# Patient Record
Sex: Female | Born: 1985 | Race: Black or African American | Hispanic: No | Smoking: Former smoker
Health system: Southern US, Community
[De-identification: ages and names within clinical notes are randomized; demographics above are authoritative.]

## PROBLEM LIST (undated history)

## (undated) DIAGNOSIS — F32A Depression, unspecified: Secondary | ICD-10-CM

## (undated) DIAGNOSIS — F329 Major depressive disorder, single episode, unspecified: Secondary | ICD-10-CM

## (undated) DIAGNOSIS — D649 Anemia, unspecified: Secondary | ICD-10-CM

## (undated) DIAGNOSIS — R519 Headache, unspecified: Secondary | ICD-10-CM

## (undated) DIAGNOSIS — R7303 Prediabetes: Secondary | ICD-10-CM

## (undated) DIAGNOSIS — K219 Gastro-esophageal reflux disease without esophagitis: Secondary | ICD-10-CM

## (undated) DIAGNOSIS — I10 Essential (primary) hypertension: Secondary | ICD-10-CM

## (undated) DIAGNOSIS — R42 Dizziness and giddiness: Secondary | ICD-10-CM

## (undated) DIAGNOSIS — M1990 Unspecified osteoarthritis, unspecified site: Secondary | ICD-10-CM

## (undated) DIAGNOSIS — F419 Anxiety disorder, unspecified: Secondary | ICD-10-CM

## (undated) HISTORY — PX: WISDOM TOOTH EXTRACTION: SHX21

## (undated) HISTORY — PX: TUBAL LIGATION: SHX77

---

## 2004-10-15 ENCOUNTER — Emergency Department: Payer: Self-pay | Admitting: Unknown Physician Specialty

## 2005-02-04 ENCOUNTER — Observation Stay: Payer: Self-pay | Admitting: Obstetrics and Gynecology

## 2005-02-05 ENCOUNTER — Observation Stay: Payer: Self-pay

## 2005-03-03 ENCOUNTER — Observation Stay: Payer: Self-pay | Admitting: Obstetrics and Gynecology

## 2005-03-11 ENCOUNTER — Observation Stay: Payer: Self-pay

## 2005-03-13 ENCOUNTER — Observation Stay: Payer: Self-pay

## 2005-03-27 ENCOUNTER — Observation Stay: Payer: Self-pay | Admitting: Obstetrics and Gynecology

## 2005-04-11 ENCOUNTER — Observation Stay: Payer: Self-pay

## 2005-04-14 ENCOUNTER — Observation Stay: Payer: Self-pay

## 2005-04-30 ENCOUNTER — Inpatient Hospital Stay: Payer: Self-pay | Admitting: Obstetrics and Gynecology

## 2005-11-25 ENCOUNTER — Emergency Department: Payer: Self-pay | Admitting: Emergency Medicine

## 2006-03-17 ENCOUNTER — Emergency Department: Payer: Self-pay | Admitting: Emergency Medicine

## 2006-07-29 ENCOUNTER — Emergency Department: Payer: Self-pay | Admitting: Emergency Medicine

## 2007-07-28 ENCOUNTER — Encounter: Payer: Self-pay | Admitting: Obstetrics and Gynecology

## 2007-09-30 ENCOUNTER — Emergency Department: Payer: Self-pay | Admitting: Emergency Medicine

## 2007-11-04 ENCOUNTER — Observation Stay: Payer: Self-pay

## 2007-12-22 ENCOUNTER — Observation Stay: Payer: Self-pay | Admitting: Obstetrics and Gynecology

## 2008-02-08 ENCOUNTER — Observation Stay: Payer: Self-pay

## 2008-02-09 ENCOUNTER — Observation Stay: Payer: Self-pay | Admitting: Obstetrics & Gynecology

## 2008-02-16 ENCOUNTER — Observation Stay: Payer: Self-pay

## 2008-02-19 ENCOUNTER — Inpatient Hospital Stay: Payer: Self-pay

## 2009-03-12 ENCOUNTER — Ambulatory Visit: Payer: Self-pay

## 2009-03-28 ENCOUNTER — Encounter: Payer: Self-pay | Admitting: Maternal & Fetal Medicine

## 2009-04-05 ENCOUNTER — Emergency Department: Payer: Self-pay | Admitting: Emergency Medicine

## 2009-04-21 ENCOUNTER — Emergency Department: Payer: Self-pay | Admitting: Emergency Medicine

## 2009-04-25 ENCOUNTER — Encounter: Payer: Self-pay | Admitting: Obstetrics and Gynecology

## 2009-05-09 ENCOUNTER — Encounter: Payer: Self-pay | Admitting: Obstetrics and Gynecology

## 2009-06-06 ENCOUNTER — Encounter: Payer: Self-pay | Admitting: Obstetrics and Gynecology

## 2009-06-17 ENCOUNTER — Encounter: Payer: Self-pay | Admitting: Maternal and Fetal Medicine

## 2009-10-17 ENCOUNTER — Observation Stay: Payer: Self-pay

## 2009-10-18 ENCOUNTER — Observation Stay: Payer: Self-pay | Admitting: Unknown Physician Specialty

## 2009-10-31 ENCOUNTER — Observation Stay: Payer: Self-pay | Admitting: Obstetrics & Gynecology

## 2009-11-04 ENCOUNTER — Inpatient Hospital Stay: Payer: Self-pay | Admitting: Obstetrics and Gynecology

## 2010-08-25 ENCOUNTER — Emergency Department (HOSPITAL_COMMUNITY): Admission: EM | Admit: 2010-08-25 | Discharge: 2010-08-25 | Payer: Self-pay | Admitting: Emergency Medicine

## 2011-02-19 LAB — URINALYSIS, ROUTINE W REFLEX MICROSCOPIC
Glucose, UA: NEGATIVE mg/dL
Nitrite: NEGATIVE
pH: 6 (ref 5.0–8.0)

## 2011-02-19 LAB — URINE MICROSCOPIC-ADD ON

## 2011-02-19 LAB — URINE CULTURE
Colony Count: 15000
Culture  Setup Time: 201109191615

## 2011-02-19 LAB — POCT PREGNANCY, URINE: Preg Test, Ur: NEGATIVE

## 2011-08-28 ENCOUNTER — Other Ambulatory Visit: Payer: Self-pay

## 2011-08-28 ENCOUNTER — Emergency Department (HOSPITAL_COMMUNITY)
Admission: EM | Admit: 2011-08-28 | Discharge: 2011-08-28 | Disposition: A | Discharge status: Home / Self Care | Payer: Medicaid Other | Attending: Emergency Medicine | Admitting: Emergency Medicine

## 2011-08-28 ENCOUNTER — Encounter: Payer: Self-pay | Admitting: *Deleted

## 2011-08-28 ENCOUNTER — Emergency Department (HOSPITAL_COMMUNITY): Payer: Medicaid Other

## 2011-08-28 DIAGNOSIS — R0789 Other chest pain: Secondary | ICD-10-CM

## 2011-08-28 DIAGNOSIS — R0602 Shortness of breath: Secondary | ICD-10-CM | POA: Insufficient documentation

## 2011-08-28 DIAGNOSIS — Z87891 Personal history of nicotine dependence: Secondary | ICD-10-CM | POA: Insufficient documentation

## 2011-08-28 DIAGNOSIS — R079 Chest pain, unspecified: Secondary | ICD-10-CM | POA: Insufficient documentation

## 2011-08-28 MED ORDER — IBUPROFEN 800 MG PO TABS
800.0000 mg | ORAL_TABLET | Freq: Once | ORAL | Status: AC
  Administered 2011-08-28: 800 mg via ORAL
  Filled 2011-08-28: qty 1

## 2011-08-28 NOTE — ED Notes (Signed)
Sinus Rhythm 71 on monitor--Taken to x-ray via w/c--Stable

## 2011-08-28 NOTE — ED Notes (Signed)
Returned to ER--back on monitor  SR  62

## 2011-08-28 NOTE — ED Provider Notes (Signed)
Medical screening examination/treatment/procedure(s) were conducted as a shared visit with non-physician practitioner(s) and myself.  I personally evaluated the patient during the encounter.  Low risk for ACS/PE;  No aggressive testing necessary  Donnetta Hutching, MD 08/28/11 1004

## 2011-08-28 NOTE — ED Provider Notes (Signed)
History     CSN: [id number] Arrival date & time: 08/28/2011  8:17 AM  Chief Complaint  Patient presents with  . Chest Pain    HPI  (Consider location/radiation/quality/duration/timing/severity/associated sxs/prior treatment)  Patient is a 25 y.o. female presenting with chest pain. The history is provided by the patient. No language interpreter was used.  Chest Pain The chest pain began 3 - 5 days ago. The pain is associated with breathing (movement and palpation). At its most intense, the pain is at 7/10. The quality of the pain is described as sharp. The pain does not radiate. Primary symptoms include shortness of breath. Pertinent negatives for primary symptoms include no fever, no fatigue, no syncope, no cough, no wheezing, no palpitations, no abdominal pain, no nausea, no vomiting, no dizziness and no altered mental status.  Pertinent negatives for associated symptoms include no claudication, no diaphoresis, no lower extremity edema, no numbness, no orthopnea, no paroxysmal nocturnal dyspnea and no weakness. She tried nothing for the symptoms.  Her past medical history is significant for anxiety/panic attacks.  Pertinent negatives for past medical history include no cancer, no congenital heart disease, no DVT, no hyperlipidemia and no hypertension.  Her family medical history is significant for CAD in family.  Procedure history is negative for cardiac catheterization, echocardiogram and exercise treadmill test.     History reviewed. No pertinent past medical history.  Past Surgical History  Procedure Date  . Bilateral tubal ligation     History reviewed. No pertinent family history.  History  Substance Use Topics  . Smoking status: Former Games developer  . Smokeless tobacco: Not on file  . Alcohol Use: Yes     occasionally    OB History    Grav Para Term Preterm Abortions TAB SAB Ect Mult Living                  Review of Systems  Review of Systems  Constitutional:  Negative for fever, diaphoresis and fatigue.  Respiratory: Positive for shortness of breath. Negative for cough and wheezing.   Cardiovascular: Positive for chest pain. Negative for palpitations, orthopnea, claudication and syncope.  Gastrointestinal: Negative for nausea, vomiting and abdominal pain.  Neurological: Negative for dizziness, weakness and numbness.  Psychiatric/Behavioral: Negative for altered mental status.    Allergies  Review of patient's allergies indicates no known allergies.  Home Medications  No current outpatient prescriptions on file.  Physical Exam    BP 145/101  Pulse 73  Temp(Src) 98.9 F (37.2 C) (Oral)  Resp 16  Ht 5\' 4"  (1.626 m)  Wt 180 lb (81.647 kg)  BMI 30.90 kg/m2  SpO2 100%  LMP 08/16/2011  Physical Exam  Nursing note and vitals reviewed. Constitutional: She appears well-developed and well-nourished. No distress.  HENT:  Head: Normocephalic.  Neck: Normal range of motion. No JVD present. No tracheal deviation present.  Cardiovascular: Normal rate, regular rhythm, normal heart sounds, intact distal pulses and normal pulses.  PMI is not displaced.  Exam reveals no gallop and no friction rub.   No murmur heard. Pulmonary/Chest: Effort normal and breath sounds normal. No stridor. No respiratory distress. She has no wheezes. She has no rales. She exhibits tenderness.  Lymphadenopathy:    She has no cervical adenopathy.  Neurological: She is alert. GCS eye subscore is 4. GCS verbal subscore is 5. GCS motor subscore is 6.  Skin: Skin is warm and dry. She is not diaphoretic.  Psychiatric: She has a normal mood and affect. Judgment normal.  ED Course  Procedures (including critical care time)  Labs Reviewed - No data to display No results found.   No diagnosis found.   MDM    Date: 08/28/2011  Rate: 68  Rhythm: normal sinus rhythm  QRS Axis: normal  Intervals: normal  ST/T Wave abnormalities: normal  Conduction  Disutrbances:none  Narrative Interpretation:   Old EKG Reviewed: none available         Worthy Rancher, PA 08/28/11 0903  Worthy Rancher, PA 08/28/11 [phone]

## 2011-08-28 NOTE — ED Notes (Signed)
Pt c/o pain in her chest in the center and epigastric area. States that it is worse with deep breathing. Pt states that it gets worse when mashing on it and with movement. Started 4 days ago. Also c/o some dizziness and shortness of breath.

## 2011-08-28 NOTE — ED Notes (Signed)
C/o intermittent mid sternal chest pain radiating into right posterior lung region that began four days ago---Last p.m. Had SOA and dizziness associated with discomfort.  Rates pain 8 on 1-10 scale.  SR 77 on monitor  B/P 130/61  R 20 P.O. 99% on room air--Lungs CTA

## 2011-11-23 ENCOUNTER — Emergency Department (HOSPITAL_COMMUNITY)
Admission: EM | Admit: 2011-11-23 | Discharge: 2011-11-23 | Disposition: A | Discharge status: Home / Self Care | Payer: Medicaid Other | Attending: Emergency Medicine | Admitting: Emergency Medicine

## 2011-11-23 ENCOUNTER — Encounter (HOSPITAL_COMMUNITY): Payer: Self-pay | Admitting: *Deleted

## 2011-11-23 DIAGNOSIS — R112 Nausea with vomiting, unspecified: Secondary | ICD-10-CM | POA: Insufficient documentation

## 2011-11-23 DIAGNOSIS — R197 Diarrhea, unspecified: Secondary | ICD-10-CM | POA: Insufficient documentation

## 2011-11-23 DIAGNOSIS — R42 Dizziness and giddiness: Secondary | ICD-10-CM | POA: Insufficient documentation

## 2011-11-23 HISTORY — DX: Major depressive disorder, single episode, unspecified: F32.9

## 2011-11-23 HISTORY — DX: Depression, unspecified: F32.A

## 2011-11-23 HISTORY — DX: Anxiety disorder, unspecified: F41.9

## 2011-11-23 LAB — CBC
Hemoglobin: 10.9 g/dL — ABNORMAL LOW (ref 12.0–15.0)
MCH: 28.5 pg (ref 26.0–34.0)
MCHC: 32.9 g/dL (ref 30.0–36.0)
MCV: 86.6 fL (ref 78.0–100.0)
RBC: 3.82 MIL/uL — ABNORMAL LOW (ref 3.87–5.11)

## 2011-11-23 LAB — BASIC METABOLIC PANEL
BUN: 4 mg/dL — ABNORMAL LOW (ref 6–23)
Calcium: 9.4 mg/dL (ref 8.4–10.5)
Creatinine, Ser: 0.68 mg/dL (ref 0.50–1.10)
GFR calc Af Amer: >90 mL/min (ref >90)
GFR calc non Af Amer: >90 mL/min (ref >90)
Glucose, Bld: 93 mg/dL (ref 70–99)

## 2011-11-23 LAB — DIFFERENTIAL
Basophils Relative: 0 % (ref 0–1)
Eosinophils Absolute: 0 10*3/uL (ref 0.0–0.7)
Eosinophils Relative: 1 % (ref 0–5)
Lymphs Abs: 2 10*3/uL (ref 0.7–4.0)
Monocytes Absolute: 0.3 10*3/uL (ref 0.1–1.0)
Monocytes Relative: 6 % (ref 3–12)

## 2011-11-23 LAB — URINALYSIS, ROUTINE W REFLEX MICROSCOPIC
Bilirubin Urine: NEGATIVE
Ketones, ur: NEGATIVE mg/dL
Nitrite: NEGATIVE
Specific Gravity, Urine: 1.01 (ref 1.005–1.030)
Urobilinogen, UA: 0.2 mg/dL (ref 0.0–1.0)
pH: 6 (ref 5.0–8.0)

## 2011-11-23 LAB — URINE MICROSCOPIC-ADD ON

## 2011-11-23 MED ORDER — ONDANSETRON HCL 4 MG/2ML IJ SOLN
4.0000 mg | Freq: Once | INTRAMUSCULAR | Status: AC
  Administered 2011-11-23: 4 mg via INTRAVENOUS
  Filled 2011-11-23: qty 2

## 2011-11-23 MED ORDER — SODIUM CHLORIDE 0.9 % IV BOLUS (SEPSIS)
1000.0000 mL | Freq: Once | INTRAVENOUS | Status: AC
  Administered 2011-11-23: 1000 mL via INTRAVENOUS

## 2011-11-23 MED ORDER — METOCLOPRAMIDE HCL 10 MG PO TABS: 10.0000 mg | ORAL_TABLET | Freq: Four times a day (QID) | ORAL | Status: AC

## 2011-11-23 MED ORDER — METOCLOPRAMIDE HCL 10 MG PO TABS: 10.0000 mg | ORAL_TABLET | Freq: Once | ORAL | Status: DC

## 2011-11-23 NOTE — ED Notes (Signed)
Lower abd pain with n/v/d that started x 4 days ago.  Pt also c/o dizziness and "confusion".  States when someone asks her a question, she "dazes out".  Pt alert and oriented x 4 at this time.  Ambulatory with steady gait to room.  nad noted.

## 2011-11-23 NOTE — ED Provider Notes (Signed)
History     CSN: [id number] Arrival date & time: 11/23/2011  9:06 AM   First MD Initiated Contact with Patient 11/23/11 [phone]      Chief Complaint  Patient presents with  . n/v/d     (Consider location/radiation/quality/duration/timing/severity/associated sxs/prior treatment) HPI Comments: Patient reports intermittent nausea, vomiting and watery diarrhea for 3-4 days.  Sx's began after eating a a fast food restaurant.  States she has not been able to keep down food or much fluids recently and states that she feels dizzy and "lightheaded" at times with position change.  She denies abd pain, headache or visual changes  Patient is a 25 y.o. female presenting with vomiting. The history is provided by the patient.  Emesis  This is a new problem. The current episode started more than 2 days ago. The problem occurs 2 to 4 times per day. The problem has been gradually improving. The emesis has an appearance of stomach contents and bilious material. There has been no fever. Associated symptoms include diarrhea. Pertinent negatives include no abdominal pain, no chills, no cough, no fever, no headaches, no myalgias and no URI. Risk factors include suspect food intake.    Past Medical History  Diagnosis Date  . Depression   . Anxiety     Past Surgical History  Procedure Date  . Bilateral tubal ligation     No family history on file.  History  Substance Use Topics  . Smoking status: Former Games developer  . Smokeless tobacco: Not on file  . Alcohol Use: Yes     occasionally    OB History    Grav Para Term Preterm Abortions TAB SAB Ect Mult Living                  Review of Systems  Constitutional: Positive for appetite change. Negative for fever, chills and activity change.  HENT: Negative for sore throat, trouble swallowing, neck pain and neck stiffness.   Respiratory: Negative for cough and chest tightness.   Cardiovascular: Negative for chest pain.  Gastrointestinal: Positive for  nausea, vomiting and diarrhea. Negative for abdominal pain.  Genitourinary: Negative for dysuria, difficulty urinating and menstrual problem.  Musculoskeletal: Negative for myalgias.  Skin: Negative.   Neurological: Positive for dizziness. Negative for seizures, syncope, numbness and headaches.  All other systems reviewed and are negative.    Allergies  Review of patient's allergies indicates no known allergies.  Home Medications   Current Outpatient Rx  Name Route Sig Dispense Refill  . ACETAMINOPHEN 500 MG PO TABS Oral Take 1,000 mg by mouth every 6 (six) hours as needed. Pain       BP 150/106  Pulse 78  Temp(Src) 98.6 F (37 C) (Oral)  Resp 16  Ht 5\' 4"  (1.626 m)  Wt 185 lb (83.915 kg)  BMI 31.76 kg/m2  SpO2 99%  LMP 11/14/2011  Physical Exam  Nursing note and vitals reviewed. Constitutional: She is oriented to person, place, and time. She appears well-developed and well-nourished. No distress.  HENT:  Head: Normocephalic and atraumatic.  Mouth/Throat: Oropharynx is clear and moist.  Eyes: EOM are normal. Pupils are equal, round, and reactive to light.  Neck: Normal range of motion. Neck supple. No thyromegaly present.  Cardiovascular: Normal rate, regular rhythm and normal heart sounds.   Pulmonary/Chest: Effort normal and breath sounds normal. No respiratory distress. She exhibits no tenderness.  Abdominal: Soft. She exhibits no distension and no mass. There is no tenderness. There is no rebound and  no guarding.  Musculoskeletal: Normal range of motion. She exhibits no edema and no tenderness.  Lymphadenopathy:    She has no cervical adenopathy.  Neurological: She is alert and oriented to person, place, and time. She has normal reflexes. No cranial nerve deficit. She exhibits normal muscle tone. Coordination normal.  Skin: Skin is warm and dry.  Psychiatric: She has a normal mood and affect.    ED Course  Procedures (including critical care time)  Results for  orders placed during the hospital encounter of 11/23/11  URINALYSIS, ROUTINE W REFLEX MICROSCOPIC      Component Value Range   Color, Urine YELLOW  YELLOW    APPearance CLEAR  CLEAR    Specific Gravity, Urine 1.010  1.005 - 1.030    pH 6.0  5.0 - 8.0    Glucose, UA NEGATIVE  NEGATIVE (mg/dL)   Hgb urine dipstick TRACE (*) NEGATIVE    Bilirubin Urine NEGATIVE  NEGATIVE    Ketones, ur NEGATIVE  NEGATIVE (mg/dL)   Protein, ur NEGATIVE  NEGATIVE (mg/dL)   Urobilinogen, UA 0.2  0.0 - 1.0 (mg/dL)   Nitrite NEGATIVE  NEGATIVE    Leukocytes, UA SMALL (*) NEGATIVE   POCT PREGNANCY, URINE      Component Value Range   Preg Test, Ur NEGATIVE    URINE MICROSCOPIC-ADD ON      Component Value Range   Squamous Epithelial / LPF FEW (*) RARE    WBC, UA 0-2  <3 (WBC/hpf)   RBC / HPF 0-2  <3 (RBC/hpf)  CBC      Component Value Range   WBC 5.7  4.0 - 10.5 (K/uL)   RBC 3.82 (*) 3.87 - 5.11 (MIL/uL)   Hemoglobin 10.9 (*) 12.0 - 15.0 (g/dL)   HCT 16.1 (*) 09.6 - 46.0 (%)   MCV 86.6  78.0 - 100.0 (fL)   MCH 28.5  26.0 - 34.0 (pg)   MCHC 32.9  30.0 - 36.0 (g/dL)   RDW 04.5  40.9 - 81.1 (%)   Platelets 256  150 - 400 (K/uL)  DIFFERENTIAL      Component Value Range   Neutrophils Relative 59  43 - 77 (%)   Neutro Abs 3.3  1.7 - 7.7 (K/uL)   Lymphocytes Relative 35  12 - 46 (%)   Lymphs Abs 2.0  0.7 - 4.0 (K/uL)   Monocytes Relative 6  3 - 12 (%)   Monocytes Absolute 0.3  0.1 - 1.0 (K/uL)   Eosinophils Relative 1  0 - 5 (%)   Eosinophils Absolute 0.0  0.0 - 0.7 (K/uL)   Basophils Relative 0  0 - 1 (%)   Basophils Absolute 0.0  0.0 - 0.1 (K/uL)  BASIC METABOLIC PANEL      Component Value Range   Sodium 138  135 - 145 (mEq/L)   Potassium 3.7  3.5 - 5.1 (mEq/L)   Chloride 103  96 - 112 (mEq/L)   CO2 31  19 - 32 (mEq/L)   Glucose, Bld 93  70 - 99 (mg/dL)   BUN 4 (*) 6 - 23 (mg/dL)   Creatinine, Ser 9.14  0.50 - 1.10 (mg/dL)   Calcium 9.4  8.4 - 78.2 (mg/dL)   GFR calc non Af Amer >90  >90  (mL/min)   GFR calc Af Amer >90  >90 (mL/min)       MDM    12:02 PM patient is feeling better.  Vitals stable.  No orthostatic hypotension.  HAs drank  fluids and received IVF's.  Abd remains soft , NT , no guarding or rebound.  I have advised her of her hgb level today.  She recently stopped her menses and with probable viral illness, the low hgb may be result of those factors.  She agrres to close f/u with her PMD for recheck.  Also agrees to return here if sx's worsen.  Patient / Family / Caregiver understand and agree with initial ED impression and plan with expectations set for ED visit.   Pt feels improved after observation and/or treatment in ED.       Domique Clapper L. [place], [place] 11/25/11 2126

## 2011-11-27 NOTE — ED Provider Notes (Signed)
Evaluation and management procedures were performed by the PA/NP under my supervision/collaboration.    Felisa Bonier, MD 11/27/11 [phone]

## 2014-08-06 ENCOUNTER — Encounter (HOSPITAL_COMMUNITY): Payer: Self-pay | Admitting: Emergency Medicine

## 2014-08-06 ENCOUNTER — Emergency Department (HOSPITAL_COMMUNITY)
Admission: EM | Admit: 2014-08-06 | Discharge: 2014-08-06 | Disposition: A | Discharge status: Home / Self Care | Payer: Medicaid Other | Attending: Emergency Medicine | Admitting: Emergency Medicine

## 2014-08-06 DIAGNOSIS — J069 Acute upper respiratory infection, unspecified: Secondary | ICD-10-CM | POA: Diagnosis not present

## 2014-08-06 DIAGNOSIS — Z8659 Personal history of other mental and behavioral disorders: Secondary | ICD-10-CM | POA: Diagnosis not present

## 2014-08-06 DIAGNOSIS — Z79899 Other long term (current) drug therapy: Secondary | ICD-10-CM | POA: Insufficient documentation

## 2014-08-06 DIAGNOSIS — J029 Acute pharyngitis, unspecified: Secondary | ICD-10-CM

## 2014-08-06 DIAGNOSIS — IMO0001 Reserved for inherently not codable concepts without codable children: Secondary | ICD-10-CM | POA: Diagnosis not present

## 2014-08-06 DIAGNOSIS — R51 Headache: Secondary | ICD-10-CM | POA: Insufficient documentation

## 2014-08-06 DIAGNOSIS — Z87891 Personal history of nicotine dependence: Secondary | ICD-10-CM | POA: Diagnosis not present

## 2014-08-06 DIAGNOSIS — R6883 Chills (without fever): Secondary | ICD-10-CM | POA: Insufficient documentation

## 2014-08-06 MED ORDER — AMOXICILLIN 500 MG PO CAPS: 500.0000 mg | ORAL_CAPSULE | Freq: Three times a day (TID) | ORAL | Status: DC

## 2014-08-06 MED ORDER — ACETAMINOPHEN-CODEINE #3 300-30 MG PO TABS: 1.0000 | ORAL_TABLET | Freq: Four times a day (QID) | ORAL | Status: DC | PRN

## 2014-08-06 MED ORDER — IBUPROFEN 800 MG PO TABS: 800.0000 mg | ORAL_TABLET | Freq: Three times a day (TID) | ORAL | Status: DC

## 2014-08-06 NOTE — ED Notes (Signed)
Pt c/o sore throat x 3 days and bilateral earache, and headache.

## 2014-08-06 NOTE — ED Provider Notes (Signed)
CSN: [id number]     Arrival date & time 08/06/14  1114 History   First MD Initiated Contact with Patient 08/06/14 1315     Chief Complaint  Patient presents with  . Sore Throat     (Consider location/radiation/quality/duration/timing/severity/associated sxs/prior Treatment) Patient is a 28 y.o. female presenting with pharyngitis. The history is provided by the patient.  Sore Throat This is a new problem. The current episode started in the past 7 days. The problem occurs intermittently. The problem has been gradually worsening. Associated symptoms include chills, headaches, myalgias and a sore throat. Pertinent negatives include no abdominal pain, arthralgias, chest pain, coughing, fever or neck pain. Associated symptoms comments: earache. The symptoms are aggravated by swallowing. She has tried acetaminophen for the symptoms. The treatment provided no relief.    Past Medical History  Diagnosis Date  . Depression   . Anxiety    Past Surgical History  Procedure Laterality Date  . Bilateral tubal ligation     No family history on file. History  Substance Use Topics  . Smoking status: Former Games developer  . Smokeless tobacco: Not on file  . Alcohol Use: Yes     Comment: occasionally   OB History   Grav Para Term Preterm Abortions TAB SAB Ect Mult Living                 Review of Systems  Constitutional: Positive for chills. Negative for fever and activity change.       All ROS Neg except as noted in HPI  HENT: Positive for sore throat.   Eyes: Negative for photophobia and discharge.  Respiratory: Negative for cough, shortness of breath and wheezing.   Cardiovascular: Negative for chest pain and palpitations.  Gastrointestinal: Negative for abdominal pain and blood in stool.  Genitourinary: Negative for dysuria, frequency and hematuria.  Musculoskeletal: Positive for myalgias. Negative for arthralgias, back pain and neck pain.  Skin: Negative.   Neurological: Positive for  headaches. Negative for dizziness, seizures and speech difficulty.  Psychiatric/Behavioral: Negative for hallucinations and confusion.      Allergies  Review of patient's allergies indicates no known allergies.  Home Medications   Prior to Admission medications   Medication Sig Start Date End Date Taking? Authorizing Provider  acetaminophen (TYLENOL) 500 MG tablet Take 1,000 mg by mouth every 6 (six) hours as needed. Pain    Yes Historical Provider, MD  lisinopril-hydrochlorothiazide (PRINZIDE,ZESTORETIC) 10-12.5 MG per tablet Take 1 tablet by mouth daily.   Yes Historical Provider, MD  Vitamin D, Ergocalciferol, (DRISDOL) 50000 UNITS CAPS capsule Take 50,000 Units by mouth every 7 (seven) days. Thursday   Yes Historical Provider, MD   BP 127/74  Pulse 113  Temp(Src) 98.4 F (36.9 C) (Oral)  Resp 18  Wt 190 lb 3 oz (86.268 kg)  SpO2 100%  LMP 07/23/2014 Physical Exam  Nursing note and vitals reviewed. Constitutional: She is oriented to person, place, and time. She appears well-developed and well-nourished.  Non-toxic appearance.  HENT:  Head: Normocephalic.  Right Ear: Tympanic membrane and external ear normal.  Left Ear: Tympanic membrane and external ear normal.  Mouth/Throat: Uvula is midline and mucous membranes are normal. No trismus in the jaw. Oropharyngeal exudate and posterior oropharyngeal erythema present. No tonsillar abscesses.  Eyes: EOM and lids are normal. Pupils are equal, round, and reactive to light.  Neck: Normal range of motion. Neck supple. Carotid bruit is not present.  Cardiovascular: Intact distal pulses and normal pulses.   Murmur heard.  Pulmonary/Chest: Breath sounds normal. No respiratory distress.  Tachycardia with occasional skip beat. 2/6 systolic murmur  Abdominal: Soft. Bowel sounds are normal. There is no tenderness. There is no guarding.  Musculoskeletal: Normal range of motion.  Lymphadenopathy:       Head (right side): No submandibular  adenopathy present.       Head (left side): No submandibular adenopathy present.    She has no cervical adenopathy.  Neurological: She is alert and oriented to person, place, and time. She has normal strength. No cranial nerve deficit or sensory deficit.  Skin: Skin is warm and dry. No rash noted.  Psychiatric: She has a normal mood and affect. Her speech is normal.    ED Course  Procedures (including critical care time) Labs Review Labs Reviewed - No data to display  Imaging Review No results found.   EKG Interpretation None      MDM Exam is consistent with pharyngitis and URI Pt will be treated with amoxil, ibuprofen and tylenol codeine. She is to wash hands frequently and follow up with PCP.   Final diagnoses:  None    *I have reviewed nursing notes, vital signs, and all appropriate lab and imaging results for this patient.Kathie Dike, PA-C 08/06/14 1356

## 2014-08-06 NOTE — Discharge Instructions (Signed)

## 2014-08-07 NOTE — ED Provider Notes (Signed)
Medical screening examination/treatment/procedure(s) were performed by non-physician practitioner and as supervising physician I was immediately available for consultation/collaboration.   EKG Interpretation None       Tasfia Vasseur M Waris Rodger, MD 08/07/14 2040 

## 2014-12-03 ENCOUNTER — Ambulatory Visit (HOSPITAL_COMMUNITY)
Admission: RE | Admit: 2014-12-03 | Discharge: 2014-12-03 | Disposition: A | Discharge status: Home / Self Care | Payer: Medicaid Other | Source: Ambulatory Visit | Attending: Nurse Practitioner | Admitting: Nurse Practitioner

## 2014-12-03 ENCOUNTER — Other Ambulatory Visit (HOSPITAL_COMMUNITY): Payer: Self-pay | Admitting: Nurse Practitioner

## 2014-12-03 DIAGNOSIS — M722 Plantar fascial fibromatosis: Secondary | ICD-10-CM | POA: Diagnosis not present

## 2014-12-03 DIAGNOSIS — M79672 Pain in left foot: Principal | ICD-10-CM

## 2014-12-03 DIAGNOSIS — M79671 Pain in right foot: Secondary | ICD-10-CM | POA: Diagnosis present

## 2014-12-03 DIAGNOSIS — G8929 Other chronic pain: Secondary | ICD-10-CM

## 2017-08-20 ENCOUNTER — Emergency Department (HOSPITAL_COMMUNITY): Payer: BLUE CROSS/BLUE SHIELD

## 2017-08-20 ENCOUNTER — Encounter (HOSPITAL_COMMUNITY): Payer: Self-pay | Admitting: *Deleted

## 2017-08-20 ENCOUNTER — Emergency Department (HOSPITAL_COMMUNITY)
Admission: EM | Admit: 2017-08-20 | Discharge: 2017-08-21 | Disposition: A | Discharge status: Home / Self Care | Payer: BLUE CROSS/BLUE SHIELD | Attending: Emergency Medicine | Admitting: Emergency Medicine

## 2017-08-20 DIAGNOSIS — Z79899 Other long term (current) drug therapy: Secondary | ICD-10-CM | POA: Insufficient documentation

## 2017-08-20 DIAGNOSIS — M791 Myalgia: Secondary | ICD-10-CM | POA: Insufficient documentation

## 2017-08-20 DIAGNOSIS — Z87891 Personal history of nicotine dependence: Secondary | ICD-10-CM | POA: Diagnosis not present

## 2017-08-20 DIAGNOSIS — R1031 Right lower quadrant pain: Secondary | ICD-10-CM | POA: Diagnosis present

## 2017-08-20 DIAGNOSIS — M7918 Myalgia, other site: Secondary | ICD-10-CM

## 2017-08-20 DIAGNOSIS — R109 Unspecified abdominal pain: Secondary | ICD-10-CM

## 2017-08-20 LAB — RAPID URINE DRUG SCREEN, HOSP PERFORMED
AMPHETAMINES: NOT DETECTED
Barbiturates: NOT DETECTED
Benzodiazepines: NOT DETECTED
Cocaine: NOT DETECTED
OPIATES: NOT DETECTED
TETRAHYDROCANNABINOL: NOT DETECTED

## 2017-08-20 LAB — CBC
HEMATOCRIT: 32 % — AB (ref 36.0–46.0)
HEMOGLOBIN: 10.6 g/dL — AB (ref 12.0–15.0)
MCH: 29.8 pg (ref 26.0–34.0)
MCHC: 33.1 g/dL (ref 30.0–36.0)
MCV: 89.9 fL (ref 78.0–100.0)
Platelets: 269 10*3/uL (ref 150–400)
RBC: 3.56 MIL/uL — ABNORMAL LOW (ref 3.87–5.11)
RDW: 16.2 % — ABNORMAL HIGH (ref 11.5–15.5)
WBC: 7.7 10*3/uL (ref 4.0–10.5)

## 2017-08-20 LAB — URINALYSIS, ROUTINE W REFLEX MICROSCOPIC
Bilirubin Urine: NEGATIVE
GLUCOSE, UA: NEGATIVE mg/dL
Hgb urine dipstick: NEGATIVE
Ketones, ur: NEGATIVE mg/dL
LEUKOCYTES UA: NEGATIVE
Nitrite: NEGATIVE
PH: 6 (ref 5.0–8.0)
PROTEIN: NEGATIVE mg/dL
SPECIFIC GRAVITY, URINE: 1.02 (ref 1.005–1.030)

## 2017-08-20 LAB — BASIC METABOLIC PANEL
Anion gap: 7 (ref 5–15)
BUN: 11 mg/dL (ref 6–20)
CHLORIDE: 105 mmol/L (ref 101–111)
CO2: 23 mmol/L (ref 22–32)
Calcium: 9 mg/dL (ref 8.9–10.3)
Creatinine, Ser: 0.77 mg/dL (ref 0.44–1.00)
GFR calc Af Amer: >60 mL/min (ref >60)
GFR calc non Af Amer: >60 mL/min (ref >60)
Glucose, Bld: 104 mg/dL — ABNORMAL HIGH (ref 65–99)
POTASSIUM: 3.9 mmol/L (ref 3.5–5.1)
Sodium: 135 mmol/L (ref 135–145)

## 2017-08-20 LAB — LIPASE, BLOOD: Lipase: 28 U/L (ref 11–51)

## 2017-08-20 MED ORDER — IOPAMIDOL (ISOVUE-300) INJECTION 61%
100.0000 mL | Freq: Once | INTRAVENOUS | Status: AC | PRN
  Administered 2017-08-21: 100 mL via INTRAVENOUS

## 2017-08-20 MED ORDER — ONDANSETRON HCL 4 MG PO TABS
4.0000 mg | ORAL_TABLET | Freq: Once | ORAL | Status: AC
  Administered 2017-08-20: 4 mg via ORAL
  Filled 2017-08-20: qty 1

## 2017-08-20 MED ORDER — ACETAMINOPHEN 500 MG PO TABS
1000.0000 mg | ORAL_TABLET | Freq: Once | ORAL | Status: AC
  Administered 2017-08-20: 1000 mg via ORAL
  Filled 2017-08-20: qty 2

## 2017-08-20 MED ORDER — IBUPROFEN 800 MG PO TABS
800.0000 mg | ORAL_TABLET | Freq: Once | ORAL | Status: DC
  Filled 2017-08-20: qty 1

## 2017-08-20 NOTE — ED Triage Notes (Signed)
Pt reports right sided flank pain that radiates into her rlq. Pt reports nausea and urinary frequency. Pt states she was seen at an Urgent Care on Tuesday and was told she had a uti and was prescribed bactrim. Pt states she is still having the pain and felt like she has had a temp.

## 2017-08-21 LAB — PREGNANCY, URINE: Preg Test, Ur: NEGATIVE

## 2017-08-21 MED ORDER — IBUPROFEN 600 MG PO TABS: 600.0000 mg | ORAL_TABLET | Freq: Four times a day (QID) | ORAL | 0 refills | Status: DC

## 2017-08-21 MED ORDER — CYCLOBENZAPRINE HCL 10 MG PO TABS: 10.0000 mg | ORAL_TABLET | Freq: Three times a day (TID) | ORAL | 0 refills | Status: DC

## 2017-08-21 NOTE — Discharge Instructions (Signed)
Your urine tests and your pregnancy test are negative for acute problem. Your blood test are negative for acute problem. Your CT scan is negative for acute problem. Please see Dr.Muse to complete your workup, especially evaluation of your lower back and the paraspinal muscles in this area. Please use a heating pad to the back and flank area. Use Flexeril 3 times daily. Use ibuprofen 4 times daily with food, until seen by Dr. Ledell Peoples. Please return to the emergency department for additional evaluation if any new changes, problems, or concerns.

## 2017-08-21 NOTE — ED Provider Notes (Signed)
[facility] DEPT Provider Note   CSN: [id number] date & time: 08/20/17  2125     History   Chief Complaint Chief Complaint  Patient presents with  . Flank Pain    HPI Alyssa Pearson is a 31 y.o. female.  Patient is a 31 year old female who presents to the emergency department with a complaint of right back and side area pain. The patient states that this problem is been going on for nearly 2 weeks. The patient's pains started in the back, went to the flank area and then down toward the right lower abdomen. She has had nausea, but no vomiting. There's been no known injury to the area. No operations or procedures to the back or flank area. No loss of any bowel or bladder function. No difficulty with using the lower extremities. The painis worse with change of position. It is difficult to find a comfortable position when lying down. Nothing makes the pain any better. The patient has tried over-the-counter medications without success. The patient states that 3 days ago she was seen at urgent care, was told she had a urinary tract infection and was prescribed Bactrim. She states that since then she has not noted any significant improvement, and at times feels as though the pain may be worse. She presents to the emergency department now for additional evaluation and management.   The history is provided by the patient.    Past Medical History:  Diagnosis Date  . Anxiety   . Depression     There are no active problems to display for this patient.   Past Surgical History:  Procedure Laterality Date  . bilateral tubal ligation      OB History    No data available       Home Medications    Prior to Admission medications   Medication Sig Start Date End Date Taking? Authorizing Provider  Ferrous Sulfate (IRON) 325 (65 Fe) MG TABS Take 1 tablet by mouth 2 (two) times daily.   Yes [provider]  Multiple Vitamins-Minerals (MULTIVITAMIN ADULTS PO) Take 1  capsule by mouth daily.   Yes [provider]  vitamin C (ASCORBIC ACID) 250 MG tablet Take 250 mg by mouth daily.   Yes [provider]  Vitamin D, Ergocalciferol, (DRISDOL) 50000 UNITS CAPS capsule Take 50,000 Units by mouth every 7 (seven) days. Thursday   Yes [provider]    Family History History reviewed. No pertinent family history.  Social History Social History  Substance Use Topics  . Smoking status: Former Games developer  . Smokeless tobacco: Never Used  . Alcohol use No     Allergies   Patient has no known allergies.   Review of Systems Review of Systems  Constitutional: Negative for activity change.       All ROS Neg except as noted in HPI  HENT: Negative for nosebleeds.   Eyes: Negative for photophobia and discharge.  Respiratory: Negative for cough, shortness of breath and wheezing.   Cardiovascular: Negative for chest pain and palpitations.  Gastrointestinal: Positive for abdominal pain. Negative for blood in stool.  Genitourinary: Positive for flank pain. Negative for dysuria, frequency and hematuria.  Musculoskeletal: Positive for back pain. Negative for arthralgias and neck pain.  Skin: Negative.   Neurological: Negative for dizziness, seizures and speech difficulty.  Psychiatric/Behavioral: Negative for confusion and hallucinations.     Physical Exam Updated Vital Signs BP 132/79 (BP Location: Right Arm)   Pulse 79   Temp  98.7 F (37.1 C) (Oral)   Resp 17   Ht  (1.626 m)   Wt 80.3 kg (177 lb)   LMP 08/05/2017   SpO2 98%   BMI 30.38 kg/m   Physical Exam  Constitutional: She is oriented to person, place, and time. She appears well-developed and well-nourished.  Non-toxic appearance.  HENT:  Head: Normocephalic.  Right Ear: Tympanic membrane and external ear normal.  Left Ear: Tympanic membrane and external ear normal.  Eyes: Pupils are equal, round, and reactive to light. EOM and lids are normal.  Neck: Normal  range of motion. Neck supple. Carotid bruit is not present.  Cardiovascular: Normal rate, regular rhythm, normal heart sounds, intact distal pulses and normal pulses.   Pulmonary/Chest: Breath sounds normal. No respiratory distress.  Abdominal: Soft. Bowel sounds are normal. There is no tenderness. There is no guarding.  There is right lower quadrant tenderness to palpation and with attempted range of motion just above the pubic crests. No pain on the left. No suprapubic area pain.  Musculoskeletal: Normal range of motion.  There is pain to the mid to lower lumbar area to palpation. There is paraspinal area tenderness in this area.  Lymphadenopathy:       Head (right side): No submandibular adenopathy present.       Head (left side): No submandibular adenopathy present.    She has no cervical adenopathy.  Neurological: She is alert and oriented to person, place, and time. She has normal strength. No cranial nerve deficit or sensory deficit. Coordination normal.  There no gross motor or sensory deficits of the lower extremities. The patient is able to stand with good bowel.  Skin: Skin is warm and dry.  Psychiatric: She has a normal mood and affect. Her speech is normal.  Nursing note and vitals reviewed.    ED Treatments / Results  Labs (all labs ordered are listed, but only abnormal results are displayed) Labs Reviewed  CBC - Abnormal; Notable for the following:       Result Value   RBC 3.56 (*)    Hemoglobin 10.6 (*)    HCT 32.0 (*)    RDW 16.2 (*)    All other components within normal limits  BASIC METABOLIC PANEL - Abnormal; Notable for the following:    Glucose, Bld 104 (*)    All other components within normal limits  URINALYSIS, ROUTINE W REFLEX MICROSCOPIC  LIPASE, BLOOD  RAPID URINE DRUG SCREEN, HOSP PERFORMED  PREGNANCY, URINE  POC URINE PREG, ED    EKG  EKG Interpretation None       Radiology Ct Abdomen Pelvis W Contrast  Result Date: 08/21/2017 CLINICAL  DATA:  Right flank pain EXAM: CT ABDOMEN AND PELVIS WITH CONTRAST TECHNIQUE: Multidetector CT imaging of the abdomen and pelvis was performed using the standard protocol following bolus administration of intravenous contrast. CONTRAST:  ISOVUE-300 IOPAMIDOL (ISOVUE-300) INJECTION 61% COMPARISON:  None. FINDINGS: Lower chest: Lung base regions are clear. Hepatobiliary: No focal liver lesions are appreciable. Gallbladder wall is not appreciably thickened. There is no biliary duct dilatation. Pancreas: No pancreatic mass or inflammatory focus. Spleen: No splenic lesions are evident. Adrenals/Urinary Tract: Adrenals appear normal bilaterally. Kidneys bilaterally show no evident mass or hydronephrosis on either side. There is a junctional parenchymal defect in each kidney, an anatomic variant. There is no appreciable renal or ureteral calculus on either side. Urinary bladder is midline with wall thickness within normal limits. Stomach/Bowel: There is no appreciable bowel wall  or mesenteric thickening. There is no evident bowel obstruction. No free air or portal venous air. Vascular/Lymphatic: There is no abdominal aortic aneurysm. No vascular lesion evident on this study. There is no appreciable adenopathy in the abdomen or pelvis. Reproductive: Uterus is anteverted. There is no appreciable pelvic mass. Other: Appendix appears normal. There is no abscess or ascites in the abdomen or pelvis. There is a small ventral hernia containing only fat. Musculoskeletal: There are no blastic or lytic bone lesions. There is no intramuscular or abdominal wall lesion. IMPRESSION: 1.  No bowel obstruction.  No abscess.  Appendix appears normal. 2.  No renal or ureteral calculus.  No hydronephrosis. 3.  Small ventral hernia containing only fat. 4. Etiology for patient's clinical symptoms has not been established with this study. Electronically Signed   By: Bretta Bang III M.D.   On: 08/21/2017 01:02     Procedures Procedures (including critical care time)  Medications Ordered in ED Medications  ibuprofen (ADVIL,MOTRIN) tablet 800 mg (800 mg Oral Not Given 08/20/17 2351)  acetaminophen (TYLENOL) tablet 1,000 mg (1,000 mg Oral Given 08/20/17 2351)  ondansetron (ZOFRAN) tablet 4 mg (4 mg Oral Given 08/20/17 2352)  iopamidol (ISOVUE-300) 61 % injection 100 mL (100 mLs Intravenous Contrast Given 08/21/17 0030)     Initial Impression / Assessment and Plan / ED Course  I have reviewed the triage vital signs and the nursing notes.  Pertinent labs & imaging results that were available during my care of the patient were reviewed by me and considered in my medical decision making (see chart for details).      Final Clinical Impressions(s) / ED Diagnoses MDM Vital signs within normal limits. Urine is negative for acute infection or evidence of kidney stone. The complete blood count shows the hemoglobin and hematocrit be slightly low, but otherwise within normal limits. Lipase is normal at 28. Basic metabolic panel shows no acute problems. Urine pregnancy is negative. A CT scan of the abdomen and pelvis with contrast is negative for any acute problems. In particular there is no evidence of any pancreas mass, appendicitis, renal stone, or mass within the reproductive area.  Patient will be treated for muscle strain. I will asked patient to see Dr. Ledell Peoples to complete the evaluation particularly of her lower back. The patient is in agreement with this plan.   Final diagnoses:  Musculoskeletal pain  Right flank pain    New Prescriptions New Prescriptions   CYCLOBENZAPRINE (FLEXERIL) 10 MG TABLET    Take 1 tablet (10 mg total) by mouth 3 (three) times daily.   IBUPROFEN (ADVIL,MOTRIN) 600 MG TABLET    Take 1 tablet (600 mg total) by mouth 4 (four) times daily.     Ivery Quale, PA-C 08/21/17 Daiva Huge    Bethann Berkshire, MD 08/21/17 [phone]

## 2017-08-31 HISTORY — PX: OTHER SURGICAL HISTORY: SHX169

## 2017-09-17 ENCOUNTER — Encounter: Payer: Self-pay | Admitting: Obstetrics and Gynecology

## 2017-09-17 ENCOUNTER — Ambulatory Visit (INDEPENDENT_AMBULATORY_CARE_PROVIDER_SITE_OTHER): Payer: BLUE CROSS/BLUE SHIELD | Admitting: Obstetrics and Gynecology

## 2017-09-17 VITALS — BP 128/78 | HR 75 | Ht 64.0 in | Wt 180.0 lb

## 2017-09-17 DIAGNOSIS — Z3169 Encounter for other general counseling and advice on procreation: Secondary | ICD-10-CM | POA: Diagnosis not present

## 2017-09-17 DIAGNOSIS — Z9889 Other specified postprocedural states: Secondary | ICD-10-CM

## 2017-09-17 NOTE — Progress Notes (Signed)
Obstetrics & Gynecology Office Visit   Chief Complaint:  Chief Complaint  Patient presents with  . Laser Consultation    discuss fertility/tubal reversal 9/25    History of Present Illness: 31 year old [id number] status post tubal reanastamosis surgery on 08/31/2017.  She presents today for preconception counseling.  She does not have a preceding history of infertility with her prior pregnancies or pregnancy complications.    She is currently taking prenatal vitamins.  Menstrual cycles are regular monthly, with intervals of 26-27 days, 4-5 days in length, + molimina symptoms.  There was not mention of adhesive disease, endometriosis, or other pathology encountered at the time of reanastomosis.    No family history of birth defects including cleft lip/cleft palate, spina bifida, heart defects, gastroschisis, or omphalocele.   No family history of genetic diseases such as SMA, CF, tay-sachs disease, or muscular dystrophy  No family history of autism, MR, or Fragile-X  No french [place], jewish, [place], or middle [place] ancestry   She otherwise has no other medical problems which might preclude pregnancy or that would be associated with adverse maternal or fetal outcomes.  Review of Systems: Review of Systems  Constitutional: Negative for weight loss.  Gastrointestinal: Negative for abdominal pain.  All other systems reviewed and are negative.   Past Medical History:  Past Medical History:  Diagnosis Date  . Anxiety   . Depression     Past Surgical History:  Past Surgical History:  Procedure Laterality Date  . TUBAL LIGATION    . Tubal reversal  08/31/2017    Gynecologic History: Patient's last menstrual period was 08/31/2017.  Obstetric History: [id number]  Family History:  History reviewed. No pertinent family history.  Social History:  Social History   Social History  . Marital status: Married    Spouse name: N/A  . Number of children: N/A  . Years of  education: N/A   Occupational History  . Not on file.   Social History Main Topics  . Smoking status: Former Games developer  . Smokeless tobacco: Never Used  . Alcohol use No  . Drug use: No  . Sexual activity: Yes    Birth control/ protection: Surgical   Other Topics Concern  . Not on file   Social History Narrative  . No narrative on file    Allergies:  No Known Allergies  Medications: Prior to Admission medications   Medication Sig Start Date End Date Taking? Authorizing Provider  ibuprofen (ADVIL,MOTRIN) 600 MG tablet Take 1 tablet (600 mg total) by mouth 4 (four) times daily. 08/21/17  Yes Ivery Quale, PA-C    Physical Exam Vitals:  Vitals:   09/17/17 1042  BP: 128/78  Pulse: 75   Patient's last menstrual period was 08/31/2017.  General: NAD HEENT: normocephalic, anicteric Pulmonary: No increased work of breathing Extremities: no edema, erythema, or tenderness Neurologic: Grossly intact Psychiatric: mood appropriate, affect full  Female chaperone present for pelvic and breast  portions of the physical exam  Assessment: 31 y.o. [id number] s/p tubal reanastamsosis 08/31/17, preconception counseling  Plan: Problem List Items Addressed This Visit      Other   History of reversal of tubal ligation    Other Visit Diagnoses    Encounter for preconception consultation    -  Primary      1) The patient was instructed to start prenatal vitamins at least one month prior to actively trying to conceive.  The role and rational of prenatal vitamins in preventing  neural tube defects were discussed.  2) Immunizations are up to date  3) Family history reviewed.  Preconception genetic testing and or counseling was not offered at today's visit based on review of personal and family history.  4) Discussed OPK, timing and frequency of intercourse to increase odds of conceiving  5) History of tubal reanastomosis - if the patient is unable to conceive in the next 12 months  would recommend proceed with HSG to evaluate tubal patency at that time.  6) A total of 25 minutes were spent in face-to-face contact with the patient during this encounter with over half of that time devoted to counseling and coordination of care.

## 2017-09-17 NOTE — Patient Instructions (Signed)
Tubal Ligation Reversal Tubal ligation is a procedure that closes the fallopian tubes to prevent pregnancy. Tubal ligation reversal is the reopening, untying, or reconnecting of separated sections of the fallopian tubes, and reversing the tubal ligation procedure that was originally performed. Success rates of getting pregnant after this procedure depend on:  Your age.  The type of tubal ligation that you had.  The length of your remaining fallopian tubes.  How healthy the tubes are.  The amount of scar tissue in your pelvic area.  Your partner's fertility.  Tell a health care provider about:  Any allergies you have.  All medicines you are taking, including vitamins, herbs, eye drops, creams, and over-the-counter medicines. This includes any use of steroids, either by mouth or in cream form.  Previous problems you or members of your family have had with the use of anesthetics.  Any blood disorders you have.  Previous surgeries you have had.  Any medical conditions you may have.  Possibility of pregnancy, if this applies. What are the risks?  Failure to reverse the original procedure.  Future blockage of a reopened or reconnected fallopian tube.  Not getting pregnant.  Ectopic pregnancy.  Infection.  Bleeding.  Injury to surrounding organs.  Blood clots in the legs and chest.  Side effects from anesthetics. What happens before the procedure?  You and your partner will likely need physical exams to make sure that you do not have any unknown fertility problems. Blood and imaging tests may also be performed for the same reason.  Ask your health care provider about: ? Changing or stopping your regular medicines. This is especially important if you are taking diabetes medicines or blood thinners. ? Taking medicines such as aspirin and ibuprofen. These medicines can thin your blood. Do not take these medicines before your procedure if your health care provider  instructs you not to.  Follow instructions from your health care provider about eating and drinking restrictions.  Plan to have someone take you home after the procedure.  If you go home right after the procedure, plan to have someone with you for 24 hours. What happens during the procedure?  You will be given one or more of the following: ? A medicine that helps you relax (sedative). ? A medicine that numbs the area (local anesthetic). ? A medicine that makes you fall asleep (general anesthetic). ? A medicine that is injected into an area of your body that numbs everything below the injection site (regional anesthetic).  If you have been given general anesthetic, a tube will be put down your throat to help you breathe.  A thin, lighted tube (laparoscope) with a camera attached will be inserted near your belly button and into the pelvic area. Other instruments will be inserted through small cuts (incisions) in your abdomen.  Clips, rings, or clamps that closed your fallopian tubes will be removed with the surgical instruments. Unconnected sections of your fallopian tubes will be reconnected.  Your fallopian tubes will be tested to make sure that they are open along their whole length.  The incisions will be closed with stitches (sutures).  A bandage (dressing) will be placed over the incisions. The procedure may vary among health care providers and hospitals. What happens after the procedure?  Your blood pressure, heart rate, breathing rate, and blood oxygen level will be monitored often until the medicines you were given have worn off.  You will be given pain medicine as needed.  If you had general anesthetic,  you may have some mild discomfort in your throat. This is from the breathing tube that was placed in your throat while you were sleeping.  You will have some mild abdominal discomfort for 3-7 days.  You will need to have a follow-up X-ray dye test about 3-4 months after  the surgery to make sure that your tubes are open and working. This information is not intended to replace advice given to you by your health care provider. Make sure you discuss any questions you have with your health care provider. Document Released: 05/24/2012 Document Revised: 10/22/2016 Document Reviewed: 03/05/2012 Elsevier Interactive Patient Education  [employer].

## 2017-09-30 ENCOUNTER — Encounter: Payer: Self-pay | Admitting: Obstetrics and Gynecology

## 2018-06-13 ENCOUNTER — Encounter: Payer: Self-pay | Admitting: Obstetrics and Gynecology

## 2018-06-16 ENCOUNTER — Other Ambulatory Visit (HOSPITAL_COMMUNITY)
Admission: RE | Admit: 2018-06-16 | Discharge: 2018-06-16 | Disposition: A | Discharge status: Home / Self Care | Payer: BLUE CROSS/BLUE SHIELD | Source: Ambulatory Visit | Attending: Obstetrics and Gynecology | Admitting: Obstetrics and Gynecology

## 2018-06-16 ENCOUNTER — Ambulatory Visit (INDEPENDENT_AMBULATORY_CARE_PROVIDER_SITE_OTHER): Payer: BLUE CROSS/BLUE SHIELD | Admitting: Obstetrics and Gynecology

## 2018-06-16 ENCOUNTER — Encounter: Payer: Self-pay | Admitting: Obstetrics and Gynecology

## 2018-06-16 ENCOUNTER — Encounter

## 2018-06-16 VITALS — BP 112/80 | HR 61 | Ht 65.0 in | Wt 190.0 lb

## 2018-06-16 DIAGNOSIS — Z113 Encounter for screening for infections with a predominantly sexual mode of transmission: Secondary | ICD-10-CM | POA: Insufficient documentation

## 2018-06-16 DIAGNOSIS — Z124 Encounter for screening for malignant neoplasm of cervix: Secondary | ICD-10-CM | POA: Diagnosis not present

## 2018-06-16 DIAGNOSIS — N923 Ovulation bleeding: Secondary | ICD-10-CM

## 2018-06-16 DIAGNOSIS — N920 Excessive and frequent menstruation with regular cycle: Secondary | ICD-10-CM

## 2018-06-16 NOTE — Progress Notes (Signed)
Obstetrics & Gynecology Office Visit   Chief Complaint:  Chief Complaint  Patient presents with  . Vaginal Bleeding    Sm. cramping spotting w/brown tinge    History of Present Illness: 32 y.o. African American female who underwent successful tubal reanastomosis October 2018.  Her partner and she are interested in conceiving in the near future, and at the time of a preconception visit the patient was reporting regular monthly menstrual cycles, positive molimina symptoms.  This past cycle started on June 19th, she subsequently experienced intermenstrual spotting July 6th-9th.  The bleeding has since subsided.  No associated pain, trauma, or other inciting event that the patient can relay.  She has not had episodes of intermenstrual spotting or postcoital bleeding previously.      Review of Systems: Review of Systems  Constitutional: Negative.   Gastrointestinal: Negative.   Neurological: Negative for headaches.   Past Medical History:  Past Medical History:  Diagnosis Date  . Anxiety   . Depression     Past Surgical History:  Past Surgical History:  Procedure Laterality Date  . TUBAL LIGATION    . Tubal reversal  08/31/2017    Gynecologic History: Patient's last menstrual period was 05/25/2018 (exact date).  Obstetric History: [id number]  Family History:  History reviewed. No pertinent family history.  Social History:  Social History   Socioeconomic History  . Marital status: Married    Spouse name: Not on file  . Number of children: Not on file  . Years of education: Not on file  . Highest education level: Not on file  Occupational History  . Not on file  Social Needs  . Financial resource strain: Not on file  . Food insecurity:    Worry: Not on file    Inability: Not on file  . Transportation needs:    Medical: Not on file    Non-medical: Not on file  Tobacco Use  . Smoking status: Former Games developer  . Smokeless tobacco: Never Used  Substance and Sexual  Activity  . Alcohol use: No  . Drug use: No  . Sexual activity: Yes    Birth control/protection: Surgical  Lifestyle  . Physical activity:    Days per week: Not on file    Minutes per session: Not on file  . Stress: Not on file  Relationships  . Social connections:    Talks on phone: Not on file    Gets together: Not on file    Attends religious service: Not on file    Active member of club or organization: Not on file    Attends meetings of clubs or organizations: Not on file    Relationship status: Not on file  . Intimate partner violence:    Fear of current or ex partner: Not on file    Emotionally abused: Not on file    Physically abused: Not on file    Forced sexual activity: Not on file  Other Topics Concern  . Not on file  Social History Narrative  . Not on file    Allergies:  No Known Allergies  Medications: Prior to Admission medications   Medication Sig Start Date End Date Taking? Authorizing Provider  ibuprofen (ADVIL,MOTRIN) 600 MG tablet Take 1 tablet (600 mg total) by mouth 4 (four) times daily. 08/21/17  Yes Ivery Quale, PA-C  tretinoin (RETIN-A) 0.05 % cream  06/08/18  Yes [provider]    Physical Exam Vitals:  Vitals:   06/16/18 8469  BP: 112/80  Pulse: 61   Patient's last menstrual period was 05/25/2018 (exact date).  General: NAD HEENT: normocephalic, anicteric Pulmonary: No increased work of breathing Genitourinary:  External: Normal external female genitalia.  Normal urethral meatus, normal  Bartholin's and Skene's glands.    Vagina: Normal vaginal mucosa, no evidence of prolapse.    Cervix: Grossly normal in appearance, no bleeding  Uterus: Non-enlarged, mobile, normal contour.  No CMT  Adnexa: ovaries non-enlarged, no adnexal masses  Rectal: deferred  Lymphatic: no evidence of inguinal lymphadenopathy Extremities: no edema, erythema, or tenderness Neurologic: Grossly intact Psychiatric: mood appropriate, affect  full  Female chaperone present for pelvic  portions of the physical exam  Assessment: 32 y.o. [id number] presenting with intermenstrual spotting  Plan: Problem List Items Addressed This Visit    None    Visit Diagnoses    Intermenstrual spotting    -  Primary   Relevant Orders   Cytology - PAP   US Transvaginal Non-OB   Screening for malignant neoplasm of cervix       Relevant Orders   Cytology - PAP   US Transvaginal Non-OB   Routine screening for STI (sexually transmitted infection)       Relevant Orders   Cytology - PAP     1) Intermenstrual bleeding  - Pap, GC/CT obtained to rule out cervicitis although no evidence of cervicitis or contact bleeding on today's exam - Ultrasound to evaluate for uterine structural abnormalities - discussed potential for implantation bleeding given timing in cycle  2) Prior tubal re-anastomosis with wailure to conceive in 8 months - HSG with next cycle  3) A total of 15 minutes were spent in face-to-face contact with the patient during this encounter with over half of that time devoted to counseling and coordination of care.  4) Return in about 1 week (around 06/23/2018) for f/u US.   Vena AustriaAndreas Ebony Yorio, MD, Evern CoreFACOG [facility], [facility] 06/16/2018, 1:30 PM

## 2018-06-17 ENCOUNTER — Encounter (INDEPENDENT_AMBULATORY_CARE_PROVIDER_SITE_OTHER): Payer: Self-pay

## 2018-06-17 LAB — CYTOLOGY - PAP
Chlamydia: NEGATIVE
Diagnosis: NEGATIVE
HPV: NOT DETECTED
Neisseria Gonorrhea: NEGATIVE

## 2018-06-21 ENCOUNTER — Ambulatory Visit (INDEPENDENT_AMBULATORY_CARE_PROVIDER_SITE_OTHER): Payer: BLUE CROSS/BLUE SHIELD | Admitting: Obstetrics and Gynecology

## 2018-06-21 ENCOUNTER — Ambulatory Visit: Payer: BLUE CROSS/BLUE SHIELD | Admitting: Obstetrics and Gynecology

## 2018-06-21 ENCOUNTER — Ambulatory Visit (INDEPENDENT_AMBULATORY_CARE_PROVIDER_SITE_OTHER): Payer: BLUE CROSS/BLUE SHIELD

## 2018-06-21 ENCOUNTER — Encounter: Payer: Self-pay | Admitting: Obstetrics and Gynecology

## 2018-06-21 VITALS — BP 136/74 | HR 79 | Ht 65.0 in | Wt 198.0 lb

## 2018-06-21 DIAGNOSIS — N939 Abnormal uterine and vaginal bleeding, unspecified: Secondary | ICD-10-CM | POA: Diagnosis not present

## 2018-06-21 DIAGNOSIS — N923 Ovulation bleeding: Secondary | ICD-10-CM | POA: Diagnosis not present

## 2018-06-21 DIAGNOSIS — R9389 Abnormal findings on diagnostic imaging of other specified body structures: Secondary | ICD-10-CM

## 2018-06-21 DIAGNOSIS — Z124 Encounter for screening for malignant neoplasm of cervix: Secondary | ICD-10-CM

## 2018-06-21 NOTE — Progress Notes (Signed)
Gynecology Ultrasound Follow Up  Chief Complaint:  Chief Complaint  Patient presents with  . Follow-up    GYN U/S     History of Present Illness: Patient is a 32 y.o. female who presents today for ultrasound evaluation of abnormal uterine bleeding.  Ultrasound demonstrates the following findgins Adnexa: no masses seen  Uterus: Normal contour, no masses, with endometrial stripe  Normal in appearance 15mm Additional: no free fluid  Has noted some slight pelvic pain consistent with patient's usual Mittleschmerz, also breast tenderness, and change in cervical mucous.    Review of Systems: Review of Systems  Constitutional: Negative.   Gastrointestinal: Negative.     Past Medical History:  Past Medical History:  Diagnosis Date  . Anxiety   . Depression     Past Surgical History:  Past Surgical History:  Procedure Laterality Date  . TUBAL LIGATION    . Tubal reversal  08/31/2017    Gynecologic History:  Patient's last menstrual period was 05/25/2018 (exact date). Contraception: none   Family History:  No family history on file.  Social History:  Social History   Socioeconomic History  . Marital status: Married    Spouse name: Not on file  . Number of children: Not on file  . Years of education: Not on file  . Highest education level: Not on file  Occupational History  . Not on file  Social Needs  . Financial resource strain: Not on file  . Food insecurity:    Worry: Not on file    Inability: Not on file  . Transportation needs:    Medical: Not on file    Non-medical: Not on file  Tobacco Use  . Smoking status: Former Games developermoker  . Smokeless tobacco: Never Used  Substance and Sexual Activity  . Alcohol use: No  . Drug use: No  . Sexual activity: Yes    Birth control/protection: Surgical  Lifestyle  . Physical activity:    Days per week: Not on file    Minutes per session: Not on file  . Stress: Not on file  Relationships  . Social connections:      Talks on phone: Not on file    Gets together: Not on file    Attends religious service: Not on file    Active member of club or organization: Not on file    Attends meetings of clubs or organizations: Not on file    Relationship status: Not on file  . Intimate partner violence:    Fear of current or ex partner: Not on file    Emotionally abused: Not on file    Physically abused: Not on file    Forced sexual activity: Not on file  Other Topics Concern  . Not on file  Social History Narrative  . Not on file    Allergies:  No Known Allergies  Medications: Prior to Admission medications   Medication Sig Start Date End Date Taking? Authorizing Provider  ibuprofen (ADVIL,MOTRIN) 600 MG tablet Take 1 tablet (600 mg total) by mouth 4 (four) times daily. 08/21/17   Ivery QualeBryant, Hobson, PA-C  tretinoin (RETIN-A) 0.05 % cream  06/08/18   [provider]    Physical Exam Vitals: Blood pressure 136/74, pulse 79, height 5\' 5"  (1.651 m), weight 198 lb (89.8 kg), last menstrual period 05/25/2018.  General: NAD HEENT: normocephalic, anicteric Pulmonary: No increased work of breathing Extremities: no edema, erythema, or tenderness Neurologic: Grossly intact, normal gait Psychiatric: mood appropriate, affect full  US Transvaginal Non-ob  Result Date: 06/21/2018 Patient Name: Alyssa Pearson DOB: 1985/12/16 MRN: [id number] ULTRASOUND REPORT Location: [facility] Date of Service: 06/21/2018 Indications:Postcoital bleeding Findings: The uterus is anteverted and measures 10.39 x 6.93 x 5.45 cm. Echo texture is heterogenous without evidence of focal masses. The Endometrium measures 15.90 mm. Right Ovary measures 4.03 x 3.08 x 2.16 cm. It is normal in appearance. Left Ovary measures 2.35 x 1.90 x 1.12 cm. It is normal in appearance. Survey of the adnexa demonstrates no adnexal masses. There is no free fluid in the cul de sac. Impression: 1. Anteverted uterus with heterogenous texture. 2.  Thickened endometrium. Recommendations: 1.Clinical correlation with the patient's History and Physical Exam. Mital bahen Leodis Binet, RDMS Images reviewed.  Normal GYN study without visualized pathology.  Vena Austria, MD, Evern Core [facility], [facility] 06/21/2018, 3:05 PM    Assessment: 32 y.o. [id number] follow up abnormal uterine bleeding  Plan: Problem List Items Addressed This Visit    None    Visit Diagnoses    Abnormal uterine bleeding (AUB)    -  Primary      1) AUB - suspect anovulatory cycle.  Pap normal.  No evidence of cervicitis on exam last visit.  Ultrasound today shows slightly thickened lining but no focal lesions.  This may be secondary to being later into her current cycle.  - Call with next menses for HSG - If fails to menstruate provera - If fails to normalize after provera further laboratory work up  2) A total of 15 minutes were spent in face-to-face contact with the patient during this encounter with over half of that time devoted to counseling and coordination of care.  3) Return call with next menses to schedule HSG.    Vena Austria, MD, Evern Core [facility], [facility] 06/21/2018, 10:01 PM

## 2018-07-04 ENCOUNTER — Other Ambulatory Visit: Payer: Self-pay | Admitting: Obstetrics and Gynecology

## 2018-07-04 ENCOUNTER — Telehealth: Payer: Self-pay | Admitting: Obstetrics and Gynecology

## 2018-07-04 ENCOUNTER — Encounter: Payer: Self-pay | Admitting: Obstetrics and Gynecology

## 2018-07-04 DIAGNOSIS — Z319 Encounter for procreative management, unspecified: Secondary | ICD-10-CM

## 2018-07-04 DIAGNOSIS — Z9889 Other specified postprocedural states: Secondary | ICD-10-CM

## 2018-07-04 NOTE — Telephone Encounter (Signed)
Patient is aware to [facility]pm [facility] for 1:30pm HSG. Patient is aware of no special instructions for the test.

## 2018-07-07 ENCOUNTER — Encounter: Payer: Self-pay | Admitting: Obstetrics and Gynecology

## 2018-07-07 ENCOUNTER — Ambulatory Visit (INDEPENDENT_AMBULATORY_CARE_PROVIDER_SITE_OTHER): Payer: BLUE CROSS/BLUE SHIELD | Admitting: Obstetrics and Gynecology

## 2018-07-07 VITALS — BP 134/80 | HR 80 | Ht 65.0 in | Wt 198.0 lb

## 2018-07-07 DIAGNOSIS — N970 Female infertility associated with anovulation: Secondary | ICD-10-CM

## 2018-07-07 DIAGNOSIS — Z01411 Encounter for gynecological examination (general) (routine) with abnormal findings: Secondary | ICD-10-CM

## 2018-07-07 DIAGNOSIS — Z131 Encounter for screening for diabetes mellitus: Secondary | ICD-10-CM | POA: Diagnosis not present

## 2018-07-07 DIAGNOSIS — E669 Obesity, unspecified: Secondary | ICD-10-CM

## 2018-07-07 DIAGNOSIS — Z833 Family history of diabetes mellitus: Secondary | ICD-10-CM

## 2018-07-07 DIAGNOSIS — Z6832 Body mass index (BMI) 32.0-32.9, adult: Secondary | ICD-10-CM

## 2018-07-07 DIAGNOSIS — Z1231 Encounter for screening mammogram for malignant neoplasm of breast: Secondary | ICD-10-CM

## 2018-07-07 DIAGNOSIS — Z1239 Encounter for other screening for malignant neoplasm of breast: Secondary | ICD-10-CM

## 2018-07-07 DIAGNOSIS — Z01419 Encounter for gynecological examination (general) (routine) without abnormal findings: Secondary | ICD-10-CM

## 2018-07-07 NOTE — Progress Notes (Signed)
Gynecology Annual Exam   PCP: Tylene Fantasia., PA-C  Chief Complaint:  Chief Complaint  Patient presents with  . Gynecologic Exam    History of Present Illness: Patient is a 32 y.o. Alyssa Pearson presents for annual exam. The patient has no complaints today.   LMP: Patient's last menstrual period was 07/03/2018 (exact date). Menses had been regular monthly until last months which was off and caused the patient to present for initial evaluation, ultrasound normal at that time normal pap and GC/CT.  Menses this month at 6 week interval with no preceding moliminal symptoms noted by the patient.  The patient is sexually active. She currently uses none for contraception. She denies dyspareunia.  The patient does perform self breast exams.  There is no notable family history of breast or ovarian cancer in her family.  The patient wears seatbelts: yes.   The patient has regular exercise: not asked.    The patient denies current symptoms of depression.    Review of Systems: Review of Systems  Constitutional: Negative for chills and fever.  HENT: Negative for congestion.   Respiratory: Negative for cough and shortness of breath.   Cardiovascular: Negative for chest pain and palpitations.  Gastrointestinal: Negative for abdominal pain, constipation, diarrhea, heartburn, nausea and vomiting.  Genitourinary: Negative for dysuria, frequency and urgency.  Skin: Negative for itching and rash.  Neurological: Negative for dizziness and headaches.  Endo/Heme/Allergies: Negative for polydipsia.  Psychiatric/Behavioral: Negative for depression.    Past Medical History:  Past Medical History:  Diagnosis Date  . Anxiety   . Depression     Past Surgical History:  Past Surgical History:  Procedure Laterality Date  . TUBAL LIGATION    . Tubal reversal  08/31/2017    Gynecologic History:  Patient's last menstrual period was 07/03/2018 (exact date). Contraception: none Last Pap: Results  were: 06/17/2018 NIL and HR HPV negative   Obstetric History: [id number]  Family History:  History reviewed. No pertinent family history.  Social History:  Social History   Socioeconomic History  . Marital status: Married    Spouse name: Not on file  . Number of children: Not on file  . Years of education: Not on file  . Highest education level: Not on file  Occupational History  . Not on file  Social Needs  . Financial resource strain: Not on file  . Food insecurity:    Worry: Not on file    Inability: Not on file  . Transportation needs:    Medical: Not on file    Non-medical: Not on file  Tobacco Use  . Smoking status: Former Games developer  . Smokeless tobacco: Never Used  Substance and Sexual Activity  . Alcohol use: No  . Drug use: No  . Sexual activity: Yes    Birth control/protection: Surgical  Lifestyle  . Physical activity:    Days per week: Not on file    Minutes per session: Not on file  . Stress: Not on file  Relationships  . Social connections:    Talks on phone: Not on file    Gets together: Not on file    Attends religious service: Not on file    Active member of club or organization: Not on file    Attends meetings of clubs or organizations: Not on file    Relationship status: Not on file  . Intimate partner violence:    Fear of current or ex partner: Not on file  Emotionally abused: Not on file    Physically abused: Not on file    Forced sexual activity: Not on file  Other Topics Concern  . Not on file  Social History Narrative  . Not on file    Allergies:  No Known Allergies  Medications: Prior to Admission medications   Medication Sig Start Date End Date Taking? Authorizing Provider  ibuprofen (ADVIL,MOTRIN) 600 MG tablet Take 1 tablet (600 mg total) by mouth 4 (four) times daily. 08/21/17  Yes Ivery QualeBryant, Hobson, PA-C  tretinoin (RETIN-A) 0.05 % cream  06/08/18  Yes [provider]    Physical Exam Vitals: Blood pressure 134/80, pulse  80, height 5\' 5"  (1.651 m), weight 198 lb (89.8 kg), last menstrual period 07/03/2018. Body mass index is 32.95 kg/m.  General: NAD HEENT: normocephalic, anicteric Thyroid: no enlargement, no palpable nodules Pulmonary: No increased work of breathing, CTAB Cardiovascular: RRR, distal pulses 2+ Breast: Breast symmetrical, no tenderness, no palpable nodules or masses, no skin or nipple retraction present, no nipple discharge.  No axillary or supraclavicular lymphadenopathy. Abdomen: NABS, soft, non-tender, non-distended.  Umbilicus without lesions.  No hepatomegaly, splenomegaly or masses palpable. No evidence of hernia  Genitourinary: deferred normal exam 1 month ago for AUB and normal ultrasound Extremities: no edema, erythema, or tenderness Neurologic: Grossly intact Psychiatric: mood appropriate, affect full  Female chaperone present for pelvic and breast  portions of the physical exam    Assessment: 32 y.o. [id number] routine annual exam  Plan: Problem List Items Addressed This Visit    None    Visit Diagnoses    Encounter for gynecological examination without abnormal finding    -  Primary   Breast screening       Anovulatory (dysfunctional uterine) bleeding       Relevant Orders   TSH+Prl+FSH+TestT+LH+DHEA S...   Screening for diabetes mellitus       Relevant Orders   Hemoglobin A1c   Family history of diabetes mellitus       Relevant Orders   Hemoglobin A1c   Class 1 obesity without serious comorbidity with body mass index (BMI) of 32.0 to 32.9 in adult, unspecified obesity type       Relevant Orders   TSH+Prl+FSH+TestT+LH+DHEA S...   Hemoglobin A1c      2) STI screening  was notoffered and therefore not obtained  2)  ASCCP guidelines and rational discussed.  Patient opts for every 3 years screening interval  3) Contraception - the patient is currently using  none.  She is attempting to conceive in the near future  4) Routine healthcare maintenance including  cholesterol, diabetes screening discussed managed by PCP  5) Return in about 1 year (around 07/08/2019) for annual.   Vena AustriaAndreas Randal Goens, MD, Merlinda FrederickFACOG [facility], [facility] 07/07/2018, 3:12 PM

## 2018-07-09 ENCOUNTER — Encounter (INDEPENDENT_AMBULATORY_CARE_PROVIDER_SITE_OTHER): Payer: Self-pay

## 2018-07-09 LAB — TSH+PRL+FSH+TESTT+LH+DHEA S...
17-Hydroxyprogesterone: 23 ng/dL
Androstenedione: 83 ng/dL (ref 41–262)
DHEA-SO4: 288.6 ug/dL (ref 84.8–378.0)
FSH: 6.2 m[IU]/mL
LH: 5.8 m[IU]/mL
PROLACTIN: 12.7 ng/mL (ref 4.8–23.3)
TESTOSTERONE FREE: 2.2 pg/mL (ref 0.0–4.2)
TESTOSTERONE: 18 ng/dL (ref 8–48)
TSH: 1.63 u[IU]/mL (ref 0.450–4.500)

## 2018-07-09 LAB — HEMOGLOBIN A1C
Est. average glucose Bld gHb Est-mCnc: 108 mg/dL
Hgb A1c MFr Bld: 5.4 % (ref 4.8–5.6)

## 2018-07-12 ENCOUNTER — Encounter (INDEPENDENT_AMBULATORY_CARE_PROVIDER_SITE_OTHER): Payer: Self-pay

## 2018-07-12 ENCOUNTER — Ambulatory Visit
Admission: RE | Admit: 2018-07-12 | Discharge: 2018-07-12 | Disposition: A | Discharge status: Home / Self Care | Payer: BLUE CROSS/BLUE SHIELD | Source: Ambulatory Visit | Attending: Obstetrics and Gynecology | Admitting: Obstetrics and Gynecology

## 2018-07-12 DIAGNOSIS — Z319 Encounter for procreative management, unspecified: Secondary | ICD-10-CM | POA: Diagnosis not present

## 2018-07-12 DIAGNOSIS — Z9889 Other specified postprocedural states: Secondary | ICD-10-CM | POA: Diagnosis not present

## 2018-07-12 MED ORDER — IOPAMIDOL (ISOVUE-370) INJECTION 76%
50.0000 mL | Freq: Once | INTRAVENOUS | Status: AC | PRN
  Administered 2018-07-12: 50 mL

## 2018-07-12 NOTE — Procedures (Signed)
Prep and drape done.  Cervix normal.  Tenaculum placed. Cervix dilated minimally.  Catheter placed into uterine cavity. Approximately 20 mL dye injected under fluoroscopic visualization.  No spill from patent tubes observed, but radiology said they would look closely and issue a final report.   Catheter and tenaculum removed.  Pt stable, tolerated procedure well. Discussed results with the patient. Asked her to follow up next week in office for official results.  Adelene Idlerhristanna Schuman MD [facility], [facility] 07/12/18 2:26 PM

## 2018-09-13 LAB — HM HIV SCREENING LAB: HM HIV Screening: NEGATIVE

## 2018-10-10 ENCOUNTER — Other Ambulatory Visit (HOSPITAL_COMMUNITY): Payer: Self-pay | Admitting: Preventative Medicine

## 2018-10-10 ENCOUNTER — Ambulatory Visit (HOSPITAL_COMMUNITY)
Admission: RE | Admit: 2018-10-10 | Discharge: 2018-10-10 | Disposition: A | Discharge status: Home / Self Care | Payer: BLUE CROSS/BLUE SHIELD | Source: Ambulatory Visit | Attending: Preventative Medicine | Admitting: Preventative Medicine

## 2018-10-10 DIAGNOSIS — M791 Myalgia, unspecified site: Secondary | ICD-10-CM

## 2018-10-10 DIAGNOSIS — M722 Plantar fascial fibromatosis: Secondary | ICD-10-CM

## 2018-10-10 DIAGNOSIS — M25512 Pain in left shoulder: Secondary | ICD-10-CM | POA: Diagnosis not present

## 2018-12-29 ENCOUNTER — Telehealth: Payer: Self-pay

## 2018-12-29 NOTE — Telephone Encounter (Signed)
Pt has gotten a job offer c a [employer] facility.  One of the requirements is for her to get a TDAP.  One of the questions is: Are you pregnant or planning to become pregnant in the next six months?  Pt states she is not pregnant but is trying to conceive.  She was told they could hold off until she is 28wks preg.  Needs a note to that effect faxed to [employer] [phone].  Pt concerned about what affect TDAP will have on unborn child.  [phone]

## 2018-12-30 NOTE — Telephone Encounter (Signed)
Pt aware.

## 2018-12-30 NOTE — Telephone Encounter (Signed)
No she can get it now

## 2019-02-10 IMAGING — RF DG HYSTEROGRAM
4 series · 4 of 4 positions shown · IV contrast (omnipaque)
Comparison: Ultrasound 06/21/2018.

CLINICAL DATA: Infertility.

EXAM:
HYSTEROSALPINGOGRAM
TECHNIQUE: Following cleansing of the cervix and vagina with Betadine solution,
a hysterosalpingogram was performed using a 5-French
hysterosalpingogram catheter and Omnipaque 300 contrast. The patient
tolerated the examination without difficulty.

[Series 1: fluoro_hsg_2fps_bw · 0.17mm/px · 1 of 1 slices shown (1 of 4)]
[im 1/1]
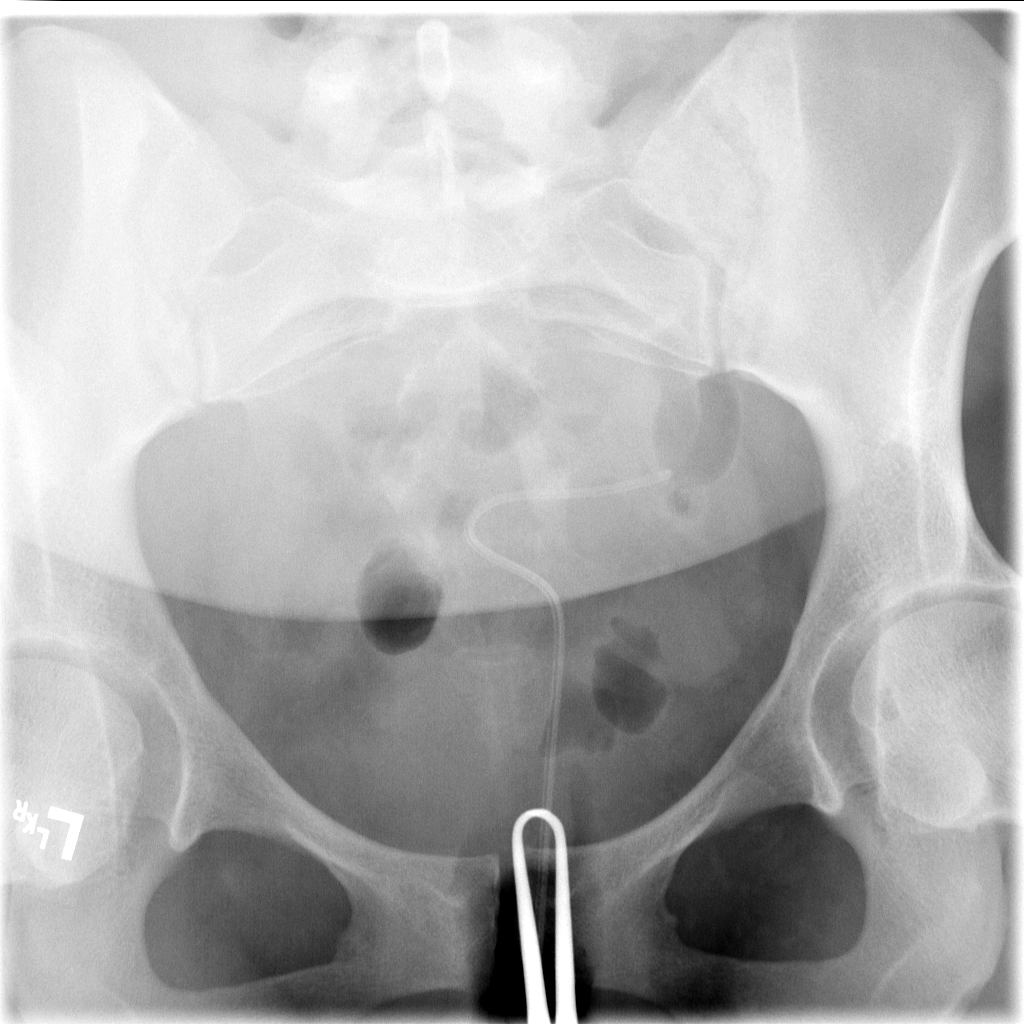

[Series 2: fluoro_hsg_2fps_bw · 0.17mm/px · 1 of 1 slices shown (2 of 4)]
[im 1/1]
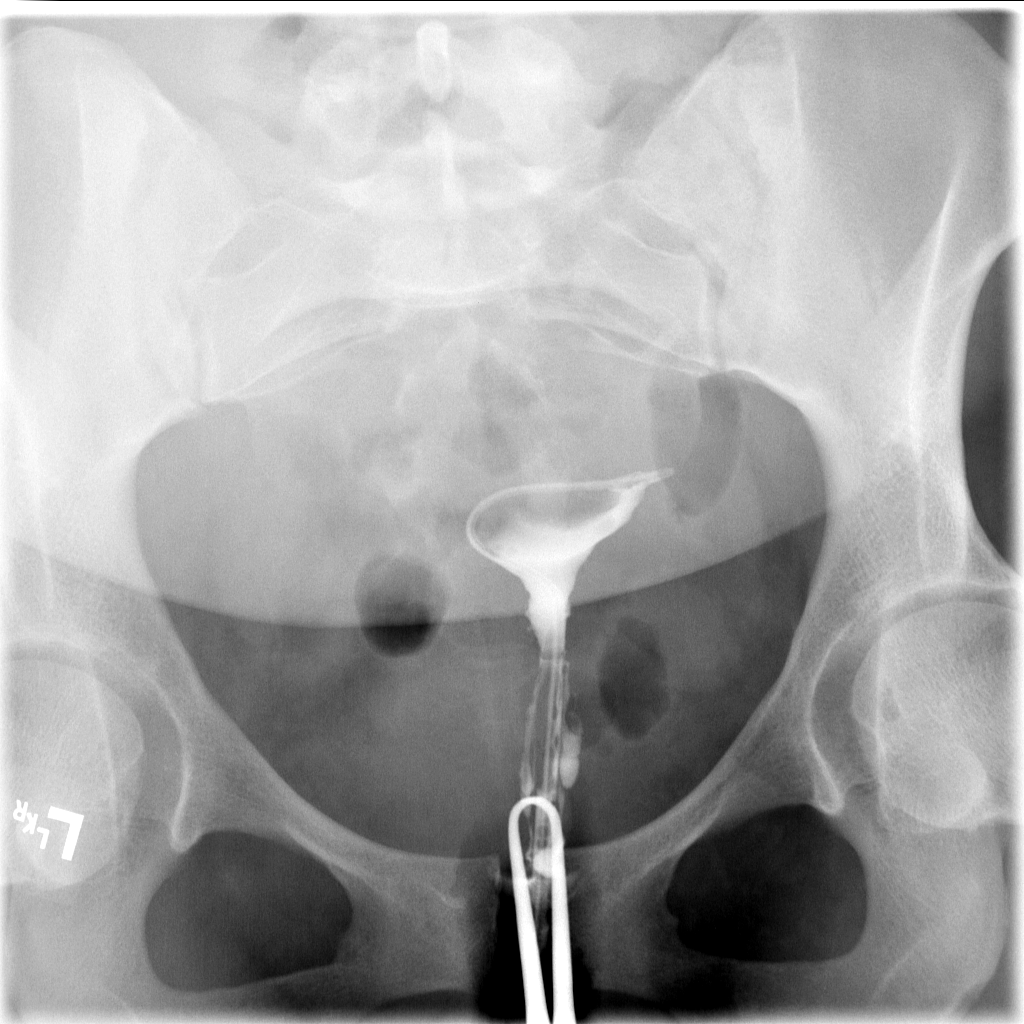

[Series 3: fluoro_hsg_2fps_bw · 0.17mm/px · 1 of 1 slices shown (3 of 4)]
[im 1/1]
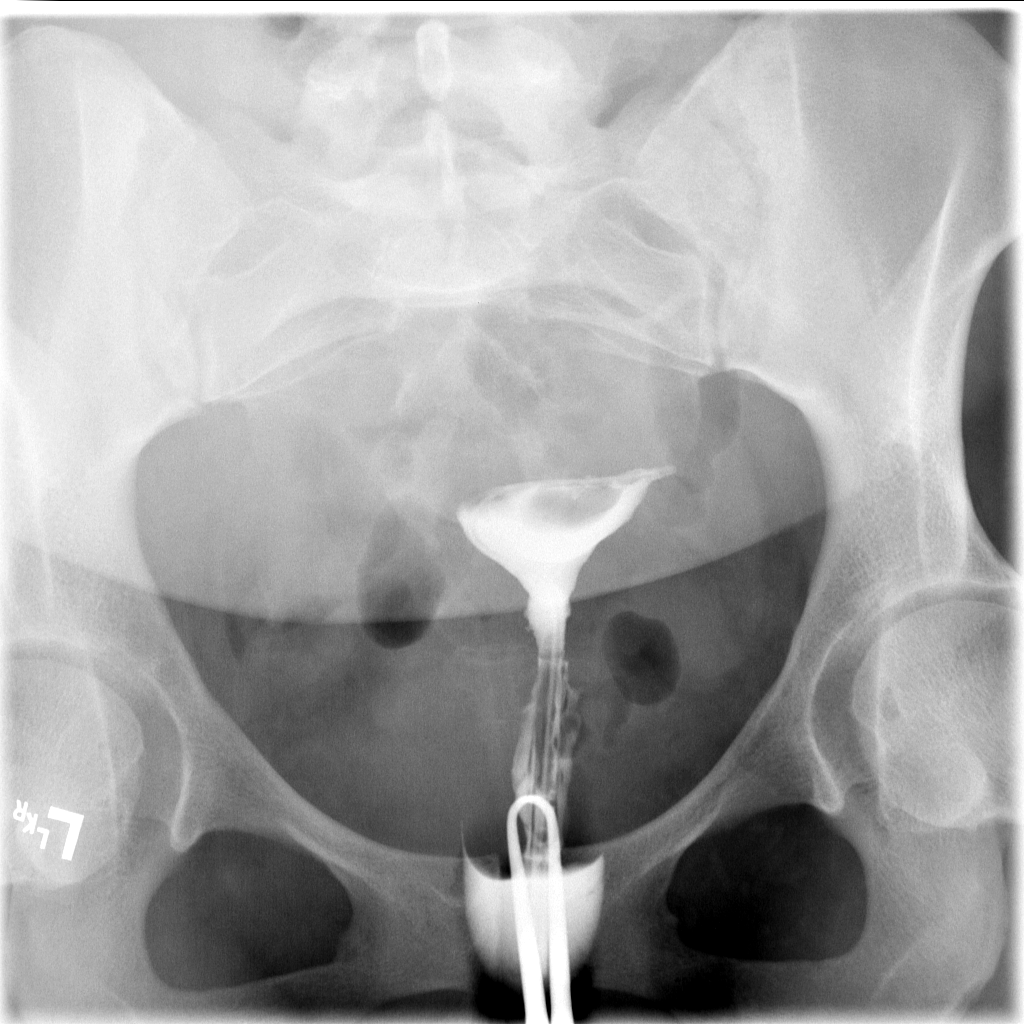

[Series 4: fluoro_hsg_2fps_bw · 0.17mm/px · 1 of 1 slices shown (4 of 4)]
[im 1/1]
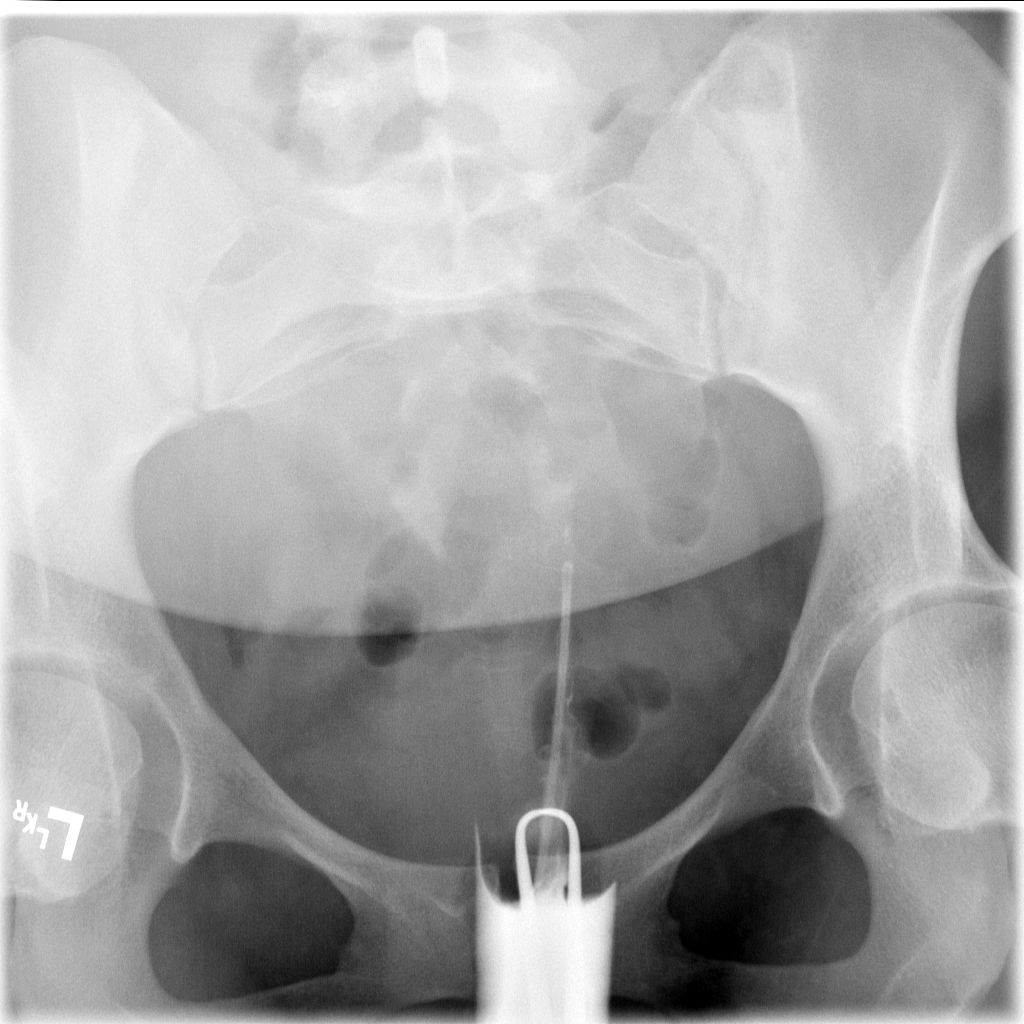

[4 of 4 positions shown; findings below may reference images not displayed]

CT 08/21/2017.

FLUOROSCOPY TIME:  Fluoroscopy Time:  2 minutes 48 seconds

Number of Acquired Images:  5
FINDINGS: Filling defect noted in the superior aspect of the uterus most
likely a bubble from the hysterosalpingogram. Endometrial fullness
is noted on recent pelvic ultrasound. Repeat pelvic ultrasound
suggested for further evaluation to exclude uterine mass or
endometrial mass. No contrast noted in the fallopian tubes.
IMPRESSION: 1.  No contrast noted the fallopian tubes.

2. Filling defect noted in the superior aspect of the uterus most
likely a bubble from the hysterosalpingogram. Endometrial fullness
is noted on recent pelvic ultrasound. Repeat pelvic ultrasound
suggested for further evaluation to exclude uterine mass or
endometrial mass.

## 2019-08-24 ENCOUNTER — Ambulatory Visit (INDEPENDENT_AMBULATORY_CARE_PROVIDER_SITE_OTHER): Payer: Medicaid Other | Admitting: Obstetrics and Gynecology

## 2019-08-24 ENCOUNTER — Other Ambulatory Visit: Payer: Self-pay

## 2019-08-24 ENCOUNTER — Encounter: Payer: Self-pay | Admitting: Obstetrics and Gynecology

## 2019-08-24 VITALS — BP 160/110 | HR 72 | Ht 65.0 in | Wt 214.0 lb

## 2019-08-24 DIAGNOSIS — R102 Pelvic and perineal pain: Secondary | ICD-10-CM | POA: Diagnosis not present

## 2019-08-24 DIAGNOSIS — I10 Essential (primary) hypertension: Secondary | ICD-10-CM

## 2019-08-24 DIAGNOSIS — N730 Acute parametritis and pelvic cellulitis: Secondary | ICD-10-CM

## 2019-08-24 MED ORDER — METRONIDAZOLE 500 MG PO TABS
500.0000 mg | ORAL_TABLET | Freq: Two times a day (BID) | ORAL | 0 refills | Status: DC
Start: 2019-08-24 — End: 2019-09-06

## 2019-08-24 MED ORDER — AMLODIPINE BESYLATE 5 MG PO TABS: 5.0000 mg | ORAL_TABLET | Freq: Every day | ORAL | 3 refills | Status: DC

## 2019-08-24 MED ORDER — CEFTRIAXONE SODIUM 250 MG IJ SOLR
250.0000 mg | Freq: Once | INTRAMUSCULAR | Status: AC
  Administered 2019-08-24: 250 mg via INTRAMUSCULAR

## 2019-08-24 MED ORDER — DOXYCYCLINE HYCLATE 100 MG PO CAPS: 100.0000 mg | ORAL_CAPSULE | Freq: Two times a day (BID) | ORAL | 0 refills | Status: DC

## 2019-08-24 NOTE — Patient Instructions (Signed)
Pelvic Inflammatory Disease ° °Pelvic inflammatory disease (PID) is caused by an infection in some or all of the female reproductive organs. The infection can be in the uterus, ovaries, fallopian tubes, or the surrounding tissues in the pelvis. PID can cause abdominal or pelvic pain that comes on suddenly (acute pelvic pain). °PID is a serious infection because it can lead to lasting (chronic) pelvic pain or the inability to have children (infertility). °What are the causes? °This condition is most often caused by bacteria that is spread during sexual contact. It can also be caused by a bacterial infection of the vagina (bacterial vaginosis) that is not spread by sexual contact. °This condition occurs when the infection is not treated and the bacteria travel upward from the vagina or cervix into the reproductive organs. Bacteria may also be introduced into the reproductive organs following: °· The birth of a baby. °· A miscarriage. °· An abortion. °· Major pelvic surgery. °· The insertion of an intrauterine device (IUD). °· A sexual assault. °What increases the risk? °You are more likely to develop this condition if you: °· Are younger than 35 years of age. °· Are sexually active at a young age. °· Have a history of STI (sexually transmitted infection) or PID. °· Do not regularly use barrier contraception methods, such as condoms. °· Have multiple sexual partners. °· Have sex with someone who has symptoms of an STI. °· Use a vaginal douche. °· Have recently had an IUD inserted. °What are the signs or symptoms? °Symptoms of this condition include: °· Abdominal or pelvic pain. °· Fever. °· Chills. °· Abnormal vaginal discharge. °· Abnormal uterine bleeding. °· Unusual pain shortly after the end of a menstrual period. °· Painful urination. °· Pain with sex. °· Nausea and vomiting. °How is this diagnosed? °This condition is diagnosed based on a pelvic exam and medical history. A pelvic exam can reveal signs of  infection, inflammation, and discharge in the vagina and the surrounding tissues. It can also help to identify painful areas. You may also have tests, such as: °· Lab tests, including a pregnancy test, blood tests, and a urine test. °· Culture tests of the vagina and cervix to check for an STI. °· Ultrasound. °· A laparoscopic procedure to look inside the pelvis. °· Examination of vaginal discharge under a microscope. °How is this treated? °This condition may be treated with: °· Antibiotic medicines taken by mouth (orally). For more severe cases, antibiotics may be given through an IV at the hospital. °· Surgery. This is rare. Surgery may be needed if other treatments do not help. °· Efforts to stop the spread of the infection. Sexual partners may need to be treated if the infection is caused by an STI. °It may take weeks until you are completely well. If you are diagnosed with PID, you should also be checked for HIV (human immunodeficiency virus). Your health care provider may test you for infection again 3 months after treatment. You should not have unprotected sex. °Follow these instructions at home: °· Take over-the-counter and prescription medicines only as told by your health care provider. °· If you were prescribed an antibiotic medicine, take it as told by your health care provider. Do not stop using the antibiotic even if you start to feel better. °· Do not have sex until treatment is completed or as told by your health care provider. If PID is confirmed, your recent sexual partners will need treatment, especially if you had unprotected sex. °· Keep all   follow-up visits as told by your health care provider. This is important. °Contact a health care provider if: °· You have increased or abnormal vaginal discharge. °· Your pain does not improve. °· You vomit. °· You have a fever. °· You cannot tolerate your medicines. °· Your partner has an STI. °· You have pain when you urinate. °Get help right away  if: °· You have increased abdominal or pelvic pain. °· You have chills. °· Your symptoms are not better in 72 hours with treatment. °Summary °· Pelvic inflammatory disease (PID) is caused by an infection in some or all of the female reproductive organs. °· PID is a serious infection because it can lead to lasting (chronic) pelvic pain or the inability to have children (infertility). °· This infection is usually treated with antibiotic medicines. °· Do not have sex until treatment is completed or as told by your health care provider. °This information is not intended to replace advice given to you by your health care provider. Make sure you discuss any questions you have with your health care provider. °Document Released: 11/23/2005 Document Revised: 08/11/2018 Document Reviewed: 08/16/2018 °Elsevier Patient Education © 2020 [employer] Inc. ° °

## 2019-08-24 NOTE — Progress Notes (Signed)
   Patient ID: Alyssa Pearson, female   DOB: [birth year], 33 y.o.   MRN: [id number]  Reason for Consult: Pelvic Pain (RLQ pain x4 days, sharp pain that comes and goes. High BP, headaches and dizziness for atleast a week)   Referred by Raiford Simmonds., PA-C  Subjective:     HPI:  Alyssa Pearson is a 33 y.o. female. She presents today with complaints of RLQ pain which comes and goes. She describes the pain as sharp and stabbing. She has also noticed dizziness and headaches for the last week.    Past Medical History:  Diagnosis Date  . Anxiety   . Depression    History reviewed. No pertinent family history. Past Surgical History:  Procedure Laterality Date  . TUBAL LIGATION    . Tubal reversal  08/31/2017    Short Social History:  Social History   Tobacco Use  . Smoking status: Former Research scientist (life sciences)  . Smokeless tobacco: Never Used  Substance Use Topics  . Alcohol use: No    No Known Allergies  Current Outpatient Medications  Medication Sig Dispense Refill  . amLODipine (NORVASC) 5 MG tablet Take 1 tablet (5 mg total) by mouth daily. 30 tablet 3  . ibuprofen (ADVIL,MOTRIN) 600 MG tablet Take 1 tablet (600 mg total) by mouth 4 (four) times daily. 30 tablet 0  . tretinoin (RETIN-A) 0.05 % cream      No current facility-administered medications for this visit.     REVIEW OF SYSTEMS      Objective:  Objective   Vitals:   08/24/19 1032  BP: (!) 160/110  Pulse: 72  Weight: 214 lb (97.1 kg)  Height: 5\' 5"  (1.651 m)   Body mass index is 35.61 kg/m.  Physical Exam Vitals signs and nursing note reviewed.  Constitutional:      Appearance: She is well-developed.  HENT:     Head: Normocephalic and atraumatic.  Eyes:     Pupils: Pupils are equal, round, and reactive to light.  Cardiovascular:     Rate and Rhythm: Normal rate and regular rhythm.  Pulmonary:     Effort: Pulmonary effort is normal. No respiratory distress.  Genitourinary:    Comments: External:  Normal appearing vulva. No lesions noted.  Speculum examination: Normal appearing cervix. No blood in the vaginal vault. normal discharge.   Bimanual examination: Uterus midline, tender, normal in size, shape and contour.  + CMT. No adnexal masses + adnexal tenderness. Pelvis not fixed.    Skin:    General: Skin is warm and dry.  Neurological:     Mental Status: She is alert and oriented to person, place, and time.  Psychiatric:        Behavior: Behavior normal.        Thought Content: Thought content normal.        Judgment: Judgment normal.         Assessment/Plan:    33 yo with pelvic pain and elevated BP Will presumptively treat for PID. Nuswab and urine culture sent. Follow up for pelvic US.  Will start on Norvasc for HTN.  More than 20 minutes were spent face to face with the patient in the room with more than 50% of the time spent providing counseling and discussing the plan of management.  Adrian Prows MD [facility], [facility] 08/24/2019 11:14 AM

## 2019-08-26 LAB — URINE CULTURE: Organism ID, Bacteria: NO GROWTH

## 2019-08-28 LAB — NUSWAB VAGINITIS PLUS (VG+)
Candida albicans, NAA: NEGATIVE
Candida glabrata, NAA: NEGATIVE
Chlamydia trachomatis, NAA: NEGATIVE
Neisseria gonorrhoeae, NAA: NEGATIVE
Trich vag by NAA: NEGATIVE

## 2019-08-30 ENCOUNTER — Ambulatory Visit (INDEPENDENT_AMBULATORY_CARE_PROVIDER_SITE_OTHER): Payer: Medicaid Other | Admitting: Obstetrics and Gynecology

## 2019-08-30 ENCOUNTER — Other Ambulatory Visit: Payer: Self-pay

## 2019-08-30 ENCOUNTER — Ambulatory Visit (INDEPENDENT_AMBULATORY_CARE_PROVIDER_SITE_OTHER): Payer: Medicaid Other

## 2019-08-30 ENCOUNTER — Encounter: Payer: Self-pay | Admitting: Obstetrics and Gynecology

## 2019-08-30 VITALS — BP 122/80 | HR 96 | Ht 65.0 in | Wt 217.0 lb

## 2019-08-30 DIAGNOSIS — N840 Polyp of corpus uteri: Secondary | ICD-10-CM

## 2019-08-30 DIAGNOSIS — Z9889 Other specified postprocedural states: Secondary | ICD-10-CM | POA: Diagnosis not present

## 2019-08-30 DIAGNOSIS — R102 Pelvic and perineal pain: Secondary | ICD-10-CM | POA: Diagnosis not present

## 2019-08-30 NOTE — H&P (View-Only) (Signed)
Patient ID: GLORIANNE PROCTOR, female   DOB: Jul 04, 1986, 33 y.o.   MRN: [id number]  Reason for Consult: Follow-up (U/S follow up )   Referred by Tylene Fantasia., PA-C  Subjective:     HPI:  YATZIRI WAINWRIGHT is a 33 y.o. female. She is following up today for a pelvic US which has shown a likely endometrial polyp. She is interested in addressing this as well as evaluating if her tubal reversal was successful. She had an HSG last year which did not show contrast in her fallopian tubes.   Past Medical History:  Diagnosis Date  . Anxiety   . Depression    History reviewed. No pertinent family history. Past Surgical History:  Procedure Laterality Date  . TUBAL LIGATION    . Tubal reversal  08/31/2017    Short Social History:  Social History   Tobacco Use  . Smoking status: Former Games developer  . Smokeless tobacco: Never Used  Substance Use Topics  . Alcohol use: No    No Known Allergies  Current Outpatient Medications  Medication Sig Dispense Refill  . amLODipine (NORVASC) 5 MG tablet Take 1 tablet (5 mg total) by mouth daily. 30 tablet 3  . doxycycline (VIBRAMYCIN) 100 MG capsule Take 1 capsule (100 mg total) by mouth 2 (two) times daily for 14 days. 28 capsule 0  . metroNIDAZOLE (FLAGYL) 500 MG tablet Take 1 tablet (500 mg total) by mouth 2 (two) times daily for 14 days. 28 tablet 0  . ibuprofen (ADVIL,MOTRIN) 600 MG tablet Take 1 tablet (600 mg total) by mouth 4 (four) times daily. (Patient not taking: Reported on 08/30/2019) 30 tablet 0  . tretinoin (RETIN-A) 0.05 % cream      No current facility-administered medications for this visit.     Review of Systems  Constitutional: Negative for chills, fatigue, fever and unexpected weight change.  HENT: Negative for trouble swallowing.  Eyes: Negative for loss of vision.  Respiratory: Negative for cough, shortness of breath and wheezing.  Cardiovascular: Negative for chest pain, leg swelling, palpitations and syncope.  GI:  Negative for abdominal pain, blood in stool, diarrhea, nausea and vomiting.  GU: Negative for difficulty urinating, dysuria, frequency and hematuria.  Musculoskeletal: Negative for back pain, leg pain and joint pain.  Skin: Negative for rash.  Neurological: Negative for dizziness, headaches, light-headedness, numbness and seizures.  Psychiatric: Negative for behavioral problem, confusion, depressed mood and sleep disturbance.        Objective:  Objective   Vitals:   08/30/19 1439  BP: 122/80  Pulse: 96  Weight: 217 lb (98.4 kg)  Height: 5\' 5"  (1.651 m)   Body mass index is 36.11 kg/m.  Physical Exam Vitals signs and nursing note reviewed.  Constitutional:      Appearance: She is well-developed.  HENT:     Head: Normocephalic and atraumatic.  Eyes:     Pupils: Pupils are equal, round, and reactive to light.  Cardiovascular:     Rate and Rhythm: Normal rate and regular rhythm.  Pulmonary:     Effort: Pulmonary effort is normal. No respiratory distress.  Skin:    General: Skin is warm and dry.  Neurological:     Mental Status: She is alert and oriented to person, place, and time.  Psychiatric:        Behavior: Behavior normal.        Thought Content: Thought content normal.        Judgment: Judgment normal.  Assessment/Plan:     33 yo [id number] with an endometrial polyp.  Discussed options for further evaluation of likely endometrial polyp by office hysteroscopy, SIS, or operating room hysteroscopy. Discussed the risks, benefits, and expected recovery from each of these procedures. She elected for a hysteroscopy, D&C and possible polypectomy in the operating room. She would like to have a concurrent procedure to evaluate there tubal ligation. Discussed chromopertubation versus repeat HSG. She would would like to proceed with chromopertubation.  Will send note to scheduel these procedure.   More than 20 minutes were spent face to face with the patient in the room with  more than 50% of the time spent providing counseling and discussing the plan of management.   Adrian Prows MD [facility] 08/30/2019 3:16 PM

## 2019-08-30 NOTE — Progress Notes (Signed)
 Patient ID: Alyssa Pearson, female   DOB: 04/23/1986, 33 y.o.   MRN: [id number]  Reason for Consult: Follow-up (U/S follow up )   Referred by Muse, Rochelle D., PA-C  Subjective:     HPI:  Alyssa Pearson is a 33 y.o. female. She is following up today for a pelvic US which has shown a likely endometrial polyp. She is interested in addressing this as well as evaluating if her tubal reversal was successful. She had an HSG last year which did not show contrast in her fallopian tubes.   Past Medical History:  Diagnosis Date  . Anxiety   . Depression    History reviewed. No pertinent family history. Past Surgical History:  Procedure Laterality Date  . TUBAL LIGATION    . Tubal reversal  08/31/2017    Short Social History:  Social History   Tobacco Use  . Smoking status: Former Smoker  . Smokeless tobacco: Never Used  Substance Use Topics  . Alcohol use: No    No Known Allergies  Current Outpatient Medications  Medication Sig Dispense Refill  . amLODipine (NORVASC) 5 MG tablet Take 1 tablet (5 mg total) by mouth daily. 30 tablet 3  . doxycycline (VIBRAMYCIN) 100 MG capsule Take 1 capsule (100 mg total) by mouth 2 (two) times daily for 14 days. 28 capsule 0  . metroNIDAZOLE (FLAGYL) 500 MG tablet Take 1 tablet (500 mg total) by mouth 2 (two) times daily for 14 days. 28 tablet 0  . ibuprofen (ADVIL,MOTRIN) 600 MG tablet Take 1 tablet (600 mg total) by mouth 4 (four) times daily. (Patient not taking: Reported on 08/30/2019) 30 tablet 0  . tretinoin (RETIN-A) 0.05 % cream      No current facility-administered medications for this visit.     Review of Systems  Constitutional: Negative for chills, fatigue, fever and unexpected weight change.  HENT: Negative for trouble swallowing.  Eyes: Negative for loss of vision.  Respiratory: Negative for cough, shortness of breath and wheezing.  Cardiovascular: Negative for chest pain, leg swelling, palpitations and syncope.  GI:  Negative for abdominal pain, blood in stool, diarrhea, nausea and vomiting.  GU: Negative for difficulty urinating, dysuria, frequency and hematuria.  Musculoskeletal: Negative for back pain, leg pain and joint pain.  Skin: Negative for rash.  Neurological: Negative for dizziness, headaches, light-headedness, numbness and seizures.  Psychiatric: Negative for behavioral problem, confusion, depressed mood and sleep disturbance.        Objective:  Objective   Vitals:   08/30/19 1439  BP: 122/80  Pulse: 96  Weight: 217 lb (98.4 kg)  Height: 5' 5" (1.651 m)   Body mass index is 36.11 kg/m.  Physical Exam Vitals signs and nursing note reviewed.  Constitutional:      Appearance: She is well-developed.  HENT:     Head: Normocephalic and atraumatic.  Eyes:     Pupils: Pupils are equal, round, and reactive to light.  Cardiovascular:     Rate and Rhythm: Normal rate and regular rhythm.  Pulmonary:     Effort: Pulmonary effort is normal. No respiratory distress.  Skin:    General: Skin is warm and dry.  Neurological:     Mental Status: She is alert and oriented to person, place, and time.  Psychiatric:        Behavior: Behavior normal.        Thought Content: Thought content normal.        Judgment: Judgment normal.       Assessment/Plan:     33 yo [id number] with an endometrial polyp.  Discussed options for further evaluation of likely endometrial polyp by office hysteroscopy, SIS, or operating room hysteroscopy. Discussed the risks, benefits, and expected recovery from each of these procedures. She elected for a hysteroscopy, D&C and possible polypectomy in the operating room. She would like to have a concurrent procedure to evaluate there tubal ligation. Discussed chromopertubation versus repeat HSG. She would would like to proceed with chromopertubation.  Will send note to scheduel these procedure.   More than 20 minutes were spent face to face with the patient in the room with  more than 50% of the time spent providing counseling and discussing the plan of management.   Adrian Prows MD [facility] 08/30/2019 3:16 PM

## 2019-09-05 ENCOUNTER — Encounter: Payer: Self-pay | Admitting: Obstetrics and Gynecology

## 2019-09-06 ENCOUNTER — Ambulatory Visit (INDEPENDENT_AMBULATORY_CARE_PROVIDER_SITE_OTHER): Payer: Medicaid Other | Admitting: Obstetrics and Gynecology

## 2019-09-06 ENCOUNTER — Telehealth: Payer: Self-pay | Admitting: Obstetrics and Gynecology

## 2019-09-06 ENCOUNTER — Encounter: Payer: Self-pay | Admitting: Obstetrics and Gynecology

## 2019-09-06 ENCOUNTER — Other Ambulatory Visit: Payer: Self-pay

## 2019-09-06 VITALS — BP 132/90 | HR 85 | Ht 64.5 in | Wt 217.0 lb

## 2019-09-06 DIAGNOSIS — N840 Polyp of corpus uteri: Secondary | ICD-10-CM

## 2019-09-06 DIAGNOSIS — Z9889 Other specified postprocedural states: Secondary | ICD-10-CM

## 2019-09-06 NOTE — Progress Notes (Signed)
Pre op visit    Patient ID: Alyssa Pearson, female   DOB: 20-May-1986, 33 y.o.   MRN: [id number]  Reason for Consult: Pre-op Exam   Referred by Tylene Fantasia., PA-C  Subjective:     HPI:  [name] is a 33 y.o. female She is here today for a preoperative visit. She has no new concerns.   Past Medical History:  Diagnosis Date  . Anxiety   . Depression    History reviewed. No pertinent family history. Past Surgical History:  Procedure Laterality Date  . TUBAL LIGATION    . Tubal reversal  08/31/2017    Short Social History:  Social History   Tobacco Use  . Smoking status: Former Games developer  . Smokeless tobacco: Never Used  Substance Use Topics  . Alcohol use: No    No Known Allergies  Current Outpatient Medications  Medication Sig Dispense Refill  . amLODipine (NORVASC) 5 MG tablet Take 1 tablet (5 mg total) by mouth daily. (Patient taking differently: Take 5 mg by mouth daily with lunch. ) 30 tablet 3  . tretinoin (RETIN-A) 0.05 % cream Apply 1 application topically daily as needed (acne).     Marland Kitchen acetaminophen (TYLENOL) 500 MG tablet Take 500 mg by mouth every 6 (six) hours as needed for moderate pain or headache.    . ibuprofen (ADVIL) 200 MG tablet Take 400 mg by mouth every 6 (six) hours as needed for headache or moderate pain.    . Prenatal Vit-Fe Fumarate-FA (PRENATAL PO) Take 1 tablet by mouth daily with lunch.     No current facility-administered medications for this visit.     Review of Systems  Constitutional: Negative for chills, fatigue, fever and unexpected weight change.  HENT: Negative for trouble swallowing.  Eyes: Negative for loss of vision.  Respiratory: Negative for cough, shortness of breath and wheezing.  Cardiovascular: Negative for chest pain, leg swelling, palpitations and syncope.  GI: Negative for abdominal pain, blood in stool, diarrhea, nausea and vomiting.  GU: Negative for difficulty urinating, dysuria, frequency and  hematuria.  Musculoskeletal: Negative for back pain, leg pain and joint pain.  Skin: Negative for rash.  Neurological: Negative for dizziness, headaches, light-headedness, numbness and seizures.  Psychiatric: Positive for behavioral problem. Negative for confusion, depressed mood and sleep disturbance.        Objective:  Objective   Vitals:   09/06/19 1501  BP: 132/90  Pulse: 85  Weight: 217 lb (98.4 kg)  Height: 5' 4.5" (1.638 m)   Body mass index is 36.67 kg/m.  Physical Exam Vitals signs and nursing note reviewed.  Constitutional:      Appearance: She is well-developed.  HENT:     Head: Normocephalic and atraumatic.  Eyes:     Pupils: Pupils are equal, round, and reactive to light.  Cardiovascular:     Rate and Rhythm: Normal rate and regular rhythm.  Pulmonary:     Effort: Pulmonary effort is normal. No respiratory distress.  Skin:    General: Skin is warm and dry.  Neurological:     Mental Status: She is alert and oriented to person, place, and time.  Psychiatric:        Behavior: Behavior normal.        Thought Content: Thought content normal.        Judgment: Judgment normal.         Assessment/Plan:    33 yo with endometrial polyp and history of tubal reversal  HSG was non- diagnostic previously for tubal reversal success.  Discussed surgical risks and alternatives in detail with the patient. These included but were not limited to risk of infection, risk of hemorrhage, and risk of injury to pelvic and abdominal organs. Sh     More than 15 minutes were spent face to face with the patient in the room with more than 50% of the time spent providing counseling and discussing the plan of management.

## 2019-09-06 NOTE — Telephone Encounter (Signed)
Patient is aware of H&P today @ 2:50pm, Pre-admit testing to be scheduled, COVID testing on 09/11/19, and OR on 09/14/19. Patient is aware she will be asked to quarantine after COVID testing. Patient is aware she may receive calls from the [facility]. Patient confirmed Medicaid.

## 2019-09-06 NOTE — Telephone Encounter (Signed)
-----   Message from Homero Fellers, MD sent at 09/04/2019  7:46 AM EDT ----- Surgery Booking Request Patient Full Name:  Alyssa Pearson  MRN: [id number]  DOB: [birth year]  Surgeon: Homero Fellers, MD  Requested Surgery Date and Time: October or November 2020 Primary Diagnosis AND Code: endometrial polyp Secondary Diagnosis and Code:  Surgical Procedure: Hysteroscopy, D&C, polypectomy, laparoscopic Chromopertubation L&D Notification: No Admission Status: same day surgery Length of Surgery: 2 hours Special Case Needs: none H&P: TBD (date) Phone Interview???: no Interpreter: Language:  Medical Clearance: no Special Scheduling Instructions: none Acuity: P3  Medical issues: hypertension

## 2019-09-07 ENCOUNTER — Encounter: Payer: Self-pay | Admitting: Obstetrics and Gynecology

## 2019-09-07 NOTE — Telephone Encounter (Signed)
2 weeks off of work Thank you

## 2019-09-11 ENCOUNTER — Other Ambulatory Visit: Payer: Self-pay

## 2019-09-11 ENCOUNTER — Encounter
Admission: RE | Admit: 2019-09-11 | Discharge: 2019-09-11 | Disposition: A | Discharge status: Home / Self Care | Payer: Medicaid Other | Source: Ambulatory Visit | Attending: Obstetrics and Gynecology | Admitting: Obstetrics and Gynecology

## 2019-09-11 DIAGNOSIS — Z01818 Encounter for other preprocedural examination: Secondary | ICD-10-CM | POA: Diagnosis not present

## 2019-09-11 DIAGNOSIS — I10 Essential (primary) hypertension: Secondary | ICD-10-CM | POA: Insufficient documentation

## 2019-09-11 DIAGNOSIS — Z20828 Contact with and (suspected) exposure to other viral communicable diseases: Secondary | ICD-10-CM | POA: Insufficient documentation

## 2019-09-11 DIAGNOSIS — Z01810 Encounter for preprocedural cardiovascular examination: Secondary | ICD-10-CM | POA: Diagnosis not present

## 2019-09-11 HISTORY — DX: Unspecified osteoarthritis, unspecified site: M19.90

## 2019-09-11 HISTORY — DX: Essential (primary) hypertension: I10

## 2019-09-11 HISTORY — DX: Gastro-esophageal reflux disease without esophagitis: K21.9

## 2019-09-11 HISTORY — DX: Headache, unspecified: R51.9

## 2019-09-11 HISTORY — DX: Anemia, unspecified: D64.9

## 2019-09-11 LAB — CBC
HCT: 32.2 % — ABNORMAL LOW (ref 36.0–46.0)
Hemoglobin: 10.1 g/dL — ABNORMAL LOW (ref 12.0–15.0)
MCH: 26.6 pg (ref 26.0–34.0)
MCHC: 31.4 g/dL (ref 30.0–36.0)
MCV: 85 fL (ref 80.0–100.0)
Platelets: 292 10*3/uL (ref 150–400)
RBC: 3.79 MIL/uL — ABNORMAL LOW (ref 3.87–5.11)
RDW: 16.8 % — ABNORMAL HIGH (ref 11.5–15.5)
WBC: 9.2 10*3/uL (ref 4.0–10.5)
nRBC: 0 % (ref 0.0–0.2)

## 2019-09-11 LAB — TYPE AND SCREEN
ABO/RH(D): A POS
Antibody Screen: NEGATIVE

## 2019-09-11 NOTE — Patient Instructions (Signed)
Your procedure is scheduled on: 09-14-19 THURSDAY Report to Same Day Surgery 2nd floor medical mall [facility] Entrance-take elevator on left to 2nd floor.  Check in with surgery information desk.) To find out your arrival time please call [phone] between 1PM - 3PM on 09-13-19 [facility]  Remember: Instructions that are not followed completely may result in serious medical risk, up to and including death, or upon the discretion of your surgeon and anesthesiologist your surgery may need to be rescheduled.    _x___ 1. Do not eat food after midnight the night before your procedure. NO GUM OR CANDY AFTER MIDNIGHT. You may drink clear liquids up to 2 hours before you are scheduled to arrive at the hospital for your procedure.  Do not drink clear liquids within 2 hours of your scheduled arrival to the hospital.  Clear liquids include  --Water or Apple juice without pulp  --Gatorade  --Black Coffee or Clear Tea (No milk, no creamers, do not add anything to the coffee or Tea   ____Ensure clear carbohydrate drink on the way to the hospital for bariatric patients  _X___Ensure clear carbohydrate drink 3 hours before surgery.    __x__ 2. No Alcohol for 24 hours before or after surgery.   __x__3. No Smoking or e-cigarettes for 24 prior to surgery.  Do not use any chewable tobacco products for at least 6 hour prior to surgery   ____  4. Bring all medications with you on the day of surgery if instructed.    __x__ 5. Notify your doctor if there is any change in your medical condition     (cold, fever, infections).    x___6. On the morning of surgery brush your teeth with toothpaste and water.  You may rinse your mouth with mouth wash if you wish.  Do not swallow any toothpaste or mouthwash.   Do not wear jewelry, make-up, hairpins, clips or nail polish.  Do not wear lotions, powders, or perfumes. You may wear deodorant.  Do not shave 48 hours prior to surgery. Men may shave face and  neck.  Do not bring valuables to the hospital.    [facility] is not responsible for any belongings or valuables.               Contacts, dentures or bridgework may not be worn into surgery.  Leave your suitcase in the car. After surgery it may be brought to your room.  For patients admitted to the hospital, discharge time is determined by your treatment team.  _  Patients discharged the day of surgery will not be allowed to drive home.  You will need someone to drive you home and stay with you the night of your procedure.    Please read over the following fact sheets that you were given:   [facility] Preparing for Surgery  _x___ TAKE THE FOLLOWING MEDICATION THE MORNING OF SURGERY WITH A SMALL SIP OF WATER. These include:  1. AMLODIPINE (NORVASC)  2.  3.  4.  5.  6.  ____Fleets enema or Magnesium Citrate as directed.   ____ Use CHG Soap or sage wipes as directed on instruction sheet   ____ Use inhalers on the day of surgery and bring to hospital day of surgery  ____ Stop Metformin and Janumet 2 days prior to surgery.    ____ Take 1/2 of usual insulin dose the night before surgery and none on the morning surgery.   ____ Follow recommendations from Cardiologist, Pulmonologist or  PCP regarding stopping Aspirin, Coumadin, Plavix ,Eliquis, Effient, or Pradaxa, and Pletal.  X____Stop Anti-inflammatories such as Advil, Aleve, Ibuprofen, Motrin, Naproxen, Naprosyn, Goodies powders or aspirin products NOW-OK to take Tylenol   ____ Stop supplements until after surgery.   ____ Bring C-Pap to the hospital.

## 2019-09-12 LAB — SARS CORONAVIRUS 2 (TAT 6-24 HRS): SARS Coronavirus 2: NEGATIVE

## 2019-09-14 ENCOUNTER — Ambulatory Visit: Payer: Medicaid Other | Admitting: Anesthesiology

## 2019-09-14 ENCOUNTER — Other Ambulatory Visit: Payer: Self-pay

## 2019-09-14 ENCOUNTER — Encounter: Payer: Self-pay | Admitting: *Deleted

## 2019-09-14 ENCOUNTER — Encounter
Admission: RE | Disposition: A | Discharge status: Home / Self Care | Payer: Medicaid Other | Source: Home / Self Care | Attending: Obstetrics and Gynecology

## 2019-09-14 ENCOUNTER — Ambulatory Visit
Admission: RE | Admit: 2019-09-14 | Discharge: 2019-09-14 | Disposition: A | Discharge status: Home / Self Care | Payer: Medicaid Other | Attending: Obstetrics and Gynecology | Admitting: Obstetrics and Gynecology

## 2019-09-14 DIAGNOSIS — M19071 Primary osteoarthritis, right ankle and foot: Secondary | ICD-10-CM | POA: Insufficient documentation

## 2019-09-14 DIAGNOSIS — N840 Polyp of corpus uteri: Secondary | ICD-10-CM | POA: Insufficient documentation

## 2019-09-14 DIAGNOSIS — Z9851 Tubal ligation status: Secondary | ICD-10-CM | POA: Diagnosis not present

## 2019-09-14 DIAGNOSIS — Z87891 Personal history of nicotine dependence: Secondary | ICD-10-CM | POA: Insufficient documentation

## 2019-09-14 DIAGNOSIS — D261 Other benign neoplasm of corpus uteri: Secondary | ICD-10-CM | POA: Diagnosis not present

## 2019-09-14 DIAGNOSIS — M19072 Primary osteoarthritis, left ankle and foot: Secondary | ICD-10-CM | POA: Insufficient documentation

## 2019-09-14 DIAGNOSIS — Z79899 Other long term (current) drug therapy: Secondary | ICD-10-CM | POA: Diagnosis not present

## 2019-09-14 DIAGNOSIS — I10 Essential (primary) hypertension: Secondary | ICD-10-CM | POA: Diagnosis not present

## 2019-09-14 HISTORY — PX: HYSTEROSCOPY WITH D & C: SHX1775

## 2019-09-14 HISTORY — PX: CHROMOPERTUBATION: SHX6288

## 2019-09-14 HISTORY — PX: LAPAROSCOPY: SHX197

## 2019-09-14 LAB — POCT PREGNANCY, URINE: Preg Test, Ur: NEGATIVE

## 2019-09-14 LAB — ABO/RH: ABO/RH(D): A POS

## 2019-09-14 SURGERY — DILATATION AND CURETTAGE /HYSTEROSCOPY
Anesthesia: General

## 2019-09-14 MED ORDER — LIDOCAINE HCL (CARDIAC) PF 100 MG/5ML IV SOSY
PREFILLED_SYRINGE | INTRAVENOUS | Status: DC | PRN
  Administered 2019-09-14: 100 mg via INTRAVENOUS

## 2019-09-14 MED ORDER — ONDANSETRON HCL 4 MG/2ML IJ SOLN
INTRAMUSCULAR | Status: DC | PRN
  Administered 2019-09-14: 4 mg via INTRAVENOUS

## 2019-09-14 MED ORDER — FENTANYL CITRATE (PF) 100 MCG/2ML IJ SOLN
INTRAMUSCULAR | Status: DC | PRN
  Administered 2019-09-14 (×2): 50 ug via INTRAVENOUS

## 2019-09-14 MED ORDER — ACETAMINOPHEN 160 MG/5ML PO SOLN
325.0000 mg | ORAL | Status: DC | PRN
  Filled 2019-09-14: qty 20.3

## 2019-09-14 MED ORDER — ACETAMINOPHEN 500 MG PO TABS: 1000.0000 mg | ORAL_TABLET | Freq: Four times a day (QID) | ORAL | 0 refills | Status: DC | PRN

## 2019-09-14 MED ORDER — ACETAMINOPHEN 325 MG PO TABS: 325.0000 mg | ORAL_TABLET | ORAL | Status: DC | PRN

## 2019-09-14 MED ORDER — BUPIVACAINE HCL (PF) 0.5 % IJ SOLN
INTRAMUSCULAR | Status: AC
  Filled 2019-09-14: qty 30

## 2019-09-14 MED ORDER — PROPOFOL 500 MG/50ML IV EMUL
INTRAVENOUS | Status: AC
  Filled 2019-09-14: qty 50

## 2019-09-14 MED ORDER — PROMETHAZINE HCL 25 MG/ML IJ SOLN
6.2500 mg | INTRAMUSCULAR | Status: DC | PRN
  Administered 2019-09-14: 19:00:00 6.25 mg via INTRAVENOUS

## 2019-09-14 MED ORDER — PROMETHAZINE HCL 25 MG/ML IJ SOLN
INTRAMUSCULAR | Status: AC
  Administered 2019-09-14: 19:00:00 6.25 mg via INTRAVENOUS
  Filled 2019-09-14: qty 1

## 2019-09-14 MED ORDER — MEPERIDINE HCL 50 MG/ML IJ SOLN: 6.2500 mg | INTRAMUSCULAR | Status: DC | PRN

## 2019-09-14 MED ORDER — SUCCINYLCHOLINE CHLORIDE 20 MG/ML IJ SOLN
INTRAMUSCULAR | Status: DC | PRN
  Administered 2019-09-14: 100 mg via INTRAVENOUS

## 2019-09-14 MED ORDER — SUGAMMADEX SODIUM 200 MG/2ML IV SOLN
INTRAVENOUS | Status: DC | PRN
  Administered 2019-09-14: 200 mg via INTRAVENOUS

## 2019-09-14 MED ORDER — FAMOTIDINE 20 MG PO TABS
20.0000 mg | ORAL_TABLET | Freq: Once | ORAL | Status: AC
  Administered 2019-09-14: 13:00:00 20 mg via ORAL

## 2019-09-14 MED ORDER — FAMOTIDINE 20 MG PO TABS
ORAL_TABLET | ORAL | Status: AC
  Administered 2019-09-14: 20 mg via ORAL
  Filled 2019-09-14: qty 1

## 2019-09-14 MED ORDER — TRAMADOL HCL 50 MG PO TABS: 50.0000 mg | ORAL_TABLET | Freq: Four times a day (QID) | ORAL | 0 refills | Status: AC | PRN

## 2019-09-14 MED ORDER — HYDROCODONE-ACETAMINOPHEN 7.5-325 MG PO TABS
1.0000 | ORAL_TABLET | Freq: Once | ORAL | Status: DC | PRN
  Filled 2019-09-14: qty 1

## 2019-09-14 MED ORDER — LACTATED RINGERS IV SOLN
INTRAVENOUS | Status: DC
  Administered 2019-09-14 (×2): via INTRAVENOUS

## 2019-09-14 MED ORDER — PHENYLEPHRINE HCL (PRESSORS) 10 MG/ML IV SOLN
INTRAVENOUS | Status: DC | PRN
  Administered 2019-09-14 (×5): 100 ug via INTRAVENOUS

## 2019-09-14 MED ORDER — FENTANYL CITRATE (PF) 100 MCG/2ML IJ SOLN: 25.0000 ug | INTRAMUSCULAR | Status: DC | PRN

## 2019-09-14 MED ORDER — SODIUM CHLORIDE (PF) 0.9 % IJ SOLN
INTRAMUSCULAR | Status: AC
  Filled 2019-09-14: qty 100

## 2019-09-14 MED ORDER — PROPOFOL 10 MG/ML IV BOLUS
INTRAVENOUS | Status: AC
  Filled 2019-09-14: qty 20

## 2019-09-14 MED ORDER — FENTANYL CITRATE (PF) 100 MCG/2ML IJ SOLN
INTRAMUSCULAR | Status: AC
  Filled 2019-09-14: qty 2

## 2019-09-14 MED ORDER — ROCURONIUM BROMIDE 100 MG/10ML IV SOLN
INTRAVENOUS | Status: DC | PRN
  Administered 2019-09-14: 30 mg via INTRAVENOUS
  Administered 2019-09-14: 20 mg via INTRAVENOUS

## 2019-09-14 MED ORDER — KETOROLAC TROMETHAMINE 30 MG/ML IJ SOLN
INTRAMUSCULAR | Status: DC | PRN
  Administered 2019-09-14: 30 mg via INTRAVENOUS

## 2019-09-14 MED ORDER — LACTATED RINGERS IV SOLN: INTRAVENOUS | Status: DC

## 2019-09-14 MED ORDER — METHYLENE BLUE 0.5 % INJ SOLN
INTRAVENOUS | Status: DC | PRN
  Administered 2019-09-14: 5 mL via SUBMUCOSAL

## 2019-09-14 MED ORDER — MIDAZOLAM HCL 2 MG/2ML IJ SOLN
INTRAMUSCULAR | Status: AC
  Filled 2019-09-14: qty 2

## 2019-09-14 MED ORDER — METHYLENE BLUE 0.5 % INJ SOLN
INTRAVENOUS | Status: AC
  Filled 2019-09-14: qty 10

## 2019-09-14 MED ORDER — SUGAMMADEX SODIUM 200 MG/2ML IV SOLN
INTRAVENOUS | Status: AC
  Filled 2019-09-14: qty 2

## 2019-09-14 MED ORDER — MIDAZOLAM HCL 2 MG/2ML IJ SOLN
INTRAMUSCULAR | Status: DC | PRN
  Administered 2019-09-14: 2 mg via INTRAVENOUS

## 2019-09-14 MED ORDER — PROPOFOL 10 MG/ML IV BOLUS
INTRAVENOUS | Status: DC | PRN
  Administered 2019-09-14: 50 mg via INTRAVENOUS
  Administered 2019-09-14: 75 mg via INTRAVENOUS
  Administered 2019-09-14: 125 mg via INTRAVENOUS

## 2019-09-14 MED ORDER — IBUPROFEN 600 MG PO TABS: 600.0000 mg | ORAL_TABLET | Freq: Four times a day (QID) | ORAL | 3 refills | Status: DC | PRN

## 2019-09-14 MED ORDER — BUPIVACAINE HCL (PF) 0.5 % IJ SOLN
INTRAMUSCULAR | Status: DC | PRN
  Administered 2019-09-14: 10 mL

## 2019-09-14 MED ORDER — DEXAMETHASONE SODIUM PHOSPHATE 10 MG/ML IJ SOLN
INTRAMUSCULAR | Status: DC | PRN
  Administered 2019-09-14: 10 mg via INTRAVENOUS

## 2019-09-14 MED ORDER — KETOROLAC TROMETHAMINE 30 MG/ML IJ SOLN: 30.0000 mg | Freq: Once | INTRAMUSCULAR | Status: DC | PRN

## 2019-09-14 MED ORDER — KETOROLAC TROMETHAMINE 30 MG/ML IJ SOLN
INTRAMUSCULAR | Status: AC
  Filled 2019-09-14: qty 1

## 2019-09-14 SURGICAL SUPPLY — 46 items
APPLICATOR COTTON TIP 6 STRL (MISCELLANEOUS) IMPLANT
APPLICATOR COTTON TIP 6IN STRL (MISCELLANEOUS) ×3
BAG URINE DRAINAGE (UROLOGICAL SUPPLIES) ×2 IMPLANT
BLADE SURG SZ11 CARB STEEL (BLADE) ×2 IMPLANT
CATH ROBINSON RED A/P 16FR (CATHETERS) ×3 IMPLANT
CHLORAPREP W/TINT 26 (MISCELLANEOUS) ×2 IMPLANT
DERMABOND ADVANCED (GAUZE/BANDAGES/DRESSINGS) ×2
DERMABOND ADVANCED .7 DNX12 (GAUZE/BANDAGES/DRESSINGS) IMPLANT
DEVICE MYOSURE LITE (MISCELLANEOUS) IMPLANT
DEVICE MYOSURE REACH (MISCELLANEOUS) IMPLANT
DRSG TEGADERM 2-3/8X2-3/4 SM (GAUZE/BANDAGES/DRESSINGS) ×2 IMPLANT
ELECT REM PT RETURN 9FT ADLT (ELECTROSURGICAL)
ELECTRODE REM PT RTRN 9FT ADLT (ELECTROSURGICAL) IMPLANT
GLOVE BIOGEL PI IND STRL 6.5 (GLOVE) ×2 IMPLANT
GLOVE BIOGEL PI INDICATOR 6.5 (GLOVE) ×6
GLOVE SURG PROTEXIS BL SZ6.5 (GLOVE) ×12 IMPLANT
GLOVE SURG SYN 6.5 ES PF (GLOVE) ×6 IMPLANT
GLOVE SURG SYN 6.5 PF PI (GLOVE) ×1 IMPLANT
GLOVE SURG SYN 6.5 PF PI BL (GLOVE) IMPLANT
GOWN STRL REUS W/ TWL LRG LVL3 (GOWN DISPOSABLE) ×2 IMPLANT
GOWN STRL REUS W/TWL LRG LVL3 (GOWN DISPOSABLE) ×4
KIT PINK PAD W/HEAD ARE REST (MISCELLANEOUS) ×3
KIT PINK PAD W/HEAD ARM REST (MISCELLANEOUS) IMPLANT
KIT PROCEDURE FLUENT (KITS) IMPLANT
MANIPULATOR UTERINE 4.5 ZUMI (MISCELLANEOUS) ×2 IMPLANT
MANIPULATOR VCARE LG CRV RETR (MISCELLANEOUS) IMPLANT
NEEDLE HYPO 22GX1.5 SAFETY (NEEDLE) ×2 IMPLANT
PACK DNC HYST (MISCELLANEOUS) ×3 IMPLANT
PACK GYN LAPAROSCOPIC (MISCELLANEOUS) ×2 IMPLANT
PAD OB MATERNITY 4.3X12.25 (PERSONAL CARE ITEMS) ×3 IMPLANT
PAD PREP 24X41 OB/GYN DISP (PERSONAL CARE ITEMS) ×3 IMPLANT
SEAL ROD LENS SCOPE MYOSURE (ABLATOR) ×3 IMPLANT
SLEEVE ENDOPATH XCEL 5M (ENDOMECHANICALS) IMPLANT
SOL .9 NS 3000ML IRR  AL (IV SOLUTION) ×2
SOL .9 NS 3000ML IRR UROMATIC (IV SOLUTION) ×1 IMPLANT
SOL PREP PVP 2OZ (MISCELLANEOUS) ×3
SOLUTION PREP PVP 2OZ (MISCELLANEOUS) IMPLANT
SPONGE GAUZE 2X2 8PLY STER LF (GAUZE/BANDAGES/DRESSINGS) ×1
SPONGE GAUZE 2X2 8PLY STRL LF (GAUZE/BANDAGES/DRESSINGS) ×1 IMPLANT
SURGILUBE 2OZ TUBE FLIPTOP (MISCELLANEOUS) ×2 IMPLANT
SUT MNCRL 4-0 (SUTURE) ×2
SUT MNCRL 4-0 27XMFL (SUTURE) ×1
SUTURE MNCRL 4-0 27XMF (SUTURE) IMPLANT
TOWEL OR 17X26 4PK STRL BLUE (TOWEL DISPOSABLE) ×3 IMPLANT
TROCAR XCEL NON-BLD 5MMX100MML (ENDOMECHANICALS) ×2 IMPLANT
TUBING INSUFFLATION (TUBING) ×2 IMPLANT

## 2019-09-14 NOTE — Transfer of Care (Signed)
Immediate Anesthesia Transfer of Care Note  Patient: Alyssa Pearson  Procedure(s) Performed: DILATATION AND CURETTAGE /HYSTEROSCOPY, POLYPECTOMY (N/A ) CHROMOPERTUBATION (N/A ) LAPAROSCOPY DIAGNOSTIC (N/A )  Patient Location: PACU  Anesthesia Type:General  Level of Consciousness: sedated  Airway & Oxygen Therapy: Patient Spontanous Breathing and Patient connected to face mask oxygen  Post-op Assessment: Report given to RN and Post -op Vital signs reviewed and stable  Post vital signs: Reviewed  Last Vitals:  Vitals Value Taken Time  BP 135/73 09/14/19 1844  Temp    Pulse 72 09/14/19 1844  Resp 24 09/14/19 1844  SpO2 100 % 09/14/19 1844  Vitals shown include unvalidated device data.  Last Pain:  Vitals:   09/14/19 1232  TempSrc: Temporal  PainSc: 0-No pain         Complications: No apparent anesthesia complications

## 2019-09-14 NOTE — Interval H&P Note (Signed)
History and Physical Interval Note:  09/14/2019 4:31 PM  Alyssa Pearson  has presented today for surgery, with the diagnosis of Endometrial polyp.  The various methods of treatment have been discussed with the patient and family. After consideration of risks, benefits and other options for treatment, the patient has consented to  Procedure(s): DILATATION AND CURETTAGE /HYSTEROSCOPY, POLYPECTOMY (N/A) CHROMOPERTUBATION (N/A) DIAGNOSTIC LAPAROSCOPY as a surgical intervention.  The patient's history has been reviewed, patient examined, no change in status, stable for surgery.  I have reviewed the patient's chart and labs.  Questions were answered to the patient's satisfaction.    [place]

## 2019-09-14 NOTE — Op Note (Signed)
Operative Note  09/14/2019  PRE-OP DIAGNOSIS: Endometrial polyp, history of tubal reversal, desires to confirm patency  POST-OP DIAGNOSIS: Endometrial polyp, history of tubal reversal, bilateral tubal patency confirmed  SURGEON: Adrian Prows MD  PROCEDURE: Procedure(s): DILATATION AND CURETTAGE /HYSTEROSCOPY, POLYPECTOMY CHROMOPERTUBATION LAPAROSCOPY DIAGNOSTIC   ANESTHESIA: Choice   ESTIMATED BLOOD LOSS: 50 cc   SPECIMENS:  Endometrial polyp and endometrial curettings  FLUID DEFICIT: Minimal  COMPLICATIONS: None  DISPOSITION: PACU - hemodynamically stable.  CONDITION: stable  FINDINGS: Exam under anesthesia revealed  Bulky uterus with bilateral adnexa without masses or fullness. Hysteroscopy revealed an endometrial polyp, but othrwise normal  uterine cavity with bilateral tubal ostia and normal appearing endocervical canal.  PROCEDURE IN DETAIL: After informed consent was obtained, the patient was taken to the operating room where anesthesia was obtained without difficulty. The patient was positioned in the dorsal lithotomy position in [place]. The patient's bladder was catheterized with an in and out foley catheter. The patient was examined under anesthesia, with the above noted findings. The weightedspeculum was placed inside the patient's vagina, and the the anterior lip of the cervix was seen and grasped with the tenaculum.  The uterine cavity was sounded to 9.5 cm, and then the cervix was progressively dilated to a 16French-Pratt dilator. The 0 degree hysteroscope was introduced, with saline fluid used to distend the intrauterine cavity, with the above noted findings.  The Myosure was used to remove the uterine polyp.  Once the cavity was sampled entirely the hysteroscope was removed. Because the patient was on her period and having somewhat brisk bleeding a gentle manual curettage of the cavity was performed. The uterine cavity was curetted until a gritty texture  was noted, yielding endometrial curettings.    A Zumi manipulator was placed into the uterine cavity and the balloon was inflated.  The single tooth tenaculum was removed.  Attention was turned to the patient's abdomen where a 5 mm skin incision was made in the umbilical fold, after injection of local anesthesia.  The 5 mm port was then placed under direct visualization with the operative laparoscope. Pneumoperitoneum was obtained. Patient positioned into Trendelenburg.  There were no intraabdominal adhesions. The liver edge was normal.  The patient was noted to have a normal uterus and ovaries. The bilateral fallopian tubes were seen. The areas of the tubal reversal were intact and normal with suture present. The chromopertubation was performed using 20 cc of dilute methylene blue.  Methylene blue was seen flowing from the fimbria of the left and right fallopian tube.   All instruments and ports were then removed from the abdomen after gas was expelled and patient was leveled.   The skin was closed with 4-0 monocryl and skin adhesive. The foley catheter was removed from the bladder. The [place] manipulator was removed from the uterus.  The patient tolerated the procedure well. All counts were correct x 2. The patient was transferred to the recovery room awake, alert and breathing independently.  Adrian Prows MD [facility] 09/14/2019 6:50 PM

## 2019-09-14 NOTE — Anesthesia Post-op Follow-up Note (Signed)
Anesthesia QCDR form completed.        

## 2019-09-14 NOTE — Discharge Instructions (Signed)
Hysteroscopy, Care After This sheet gives you information about how to care for yourself after your procedure. Your health care provider may also give you more specific instructions. If you have problems or questions, contact your health care provider. What can I expect after the procedure? After the procedure, it is common to have:  Cramping.  Bleeding. This can vary from light spotting to menstrual-like bleeding. Follow these instructions at home: Activity  Rest for 1-2 days after the procedure.  Do not douche, use tampons, or have sex for 2 weeks after the procedure, or until your health care provider approves.  Do not drive for 24 hours after the procedure, or for as long as told by your health care provider.  Do not drive, use heavy machinery, or drink alcohol while taking prescription pain medicines. Medicines   Take over-the-counter and prescription medicines only as told by your health care provider.  Do not take aspirin during recovery. It can increase the risk of bleeding. General instructions  Do not take baths, swim, or use a hot tub until your health care provider approves. Take showers instead of baths for 2 weeks, or for as long as told by your health care provider.  To prevent or treat constipation while you are taking prescription pain medicine, your health care provider may recommend that you: ? Drink enough fluid to keep your urine clear or pale yellow. ? Take over-the-counter or prescription medicines. ? Eat foods that are high in fiber, such as fresh fruits and vegetables, whole grains, and beans. ? Limit foods that are high in fat and processed sugars, such as fried and sweet foods.  Keep all follow-up visits as told by your health care provider. This is important. Contact a health care provider if:  You feel dizzy or lightheaded.  You feel nauseous.  You have abnormal vaginal discharge.  You have a rash.  You have pain that does not get better with  medicine.  You have chills. Get help right away if:  You have bleeding that is heavier than a normal menstrual period.  You have a fever.  You have pain or cramps that get worse.  You develop new abdominal pain.  You faint.  You have pain in your shoulders.  You have shortness of breath. Summary  After the procedure, you may have cramping and some vaginal bleeding.  Do not douche, use tampons, or have sex for 2 weeks after the procedure, or until your health care provider approves.  Do not take baths, swim, or use a hot tub until your health care provider approves. Take showers instead of baths for 2 weeks, or for as long as told by your health care provider.  Report any unusual symptoms to your health care provider.  Keep all follow-up visits as told by your health care provider. This is important. This information is not intended to replace advice given to you by your health care provider. Make sure you discuss any questions you have with your health care provider. Document Released: 09/13/2013 Document Revised: 11/05/2017 Document Reviewed: 12/22/2016 Elsevier Patient Education  [employer].   Diagnostic Laparoscopy, Care After This sheet gives you information about how to care for yourself after your procedure. Your health care provider may also give you more specific instructions. If you have problems or questions, contact your health care provider. What can I expect after the procedure? After the procedure, it is common to have:  Mild discomfort in the abdomen.  Sore throat. Women  who have laparoscopy with pelvic examination may have mild cramping and fluid coming from the vagina for a few days after the procedure. Follow these instructions at home: Medicines  Take over-the-counter and prescription medicines only as told by your health care provider.  If you were prescribed an antibiotic medicine, take it as told by your health care provider. Do not stop  taking the antibiotic even if you start to feel better. Driving  Do not drive for 24 hours if you were given a medicine to help you relax (sedative) during your procedure.  Do not drive or use heavy machinery while taking prescription pain medicine. Bathing  Do not take baths, swim, or use a hot tub until your health care provider approves. You may take showers. Incision care   Follow instructions from your health care provider about how to take care of your incisions. Make sure you: ? Wash your hands with soap and water before you change your bandage (dressing). If soap and water are not available, use hand sanitizer. ? Change your dressing as told by your health care provider. ? Leave stitches (sutures), skin glue, or adhesive strips in place. These skin closures may need to stay in place for 2 weeks or longer. If adhesive strip edges start to loosen and curl up, you may trim the loose edges. Do not remove adhesive strips completely unless your health care provider tells you to do that.  Check your incision areas every day for signs of infection. Check for: ? Redness, swelling, or pain. ? Fluid or blood. ? Warmth. ? Pus or a bad smell. Activity  Return to your normal activities as told by your health care provider. Ask your health care provider what activities are safe for you.  Do not lift anything that is heavier than 10 lb (4.5 kg), or the limit that you are told, until your health care provider says that it is safe. General instructions  To prevent or treat constipation while you are taking prescription pain medicine, your health care provider may recommend that you: ? Drink enough fluid to keep your urine pale yellow. ? Take over-the-counter or prescription medicines. ? Eat foods that are high in fiber, such as fresh fruits and vegetables, whole grains, and beans. ? Limit foods that are high in fat and processed sugars, such as fried and sweet foods.  Do not use any products  that contain nicotine or tobacco, such as cigarettes and e-cigarettes. If you need help quitting, ask your health care provider.  Keep all follow-up visits as told by your health care provider. This is important. Contact a health care provider if:  You develop shoulder pain.  You feel lightheaded or faint.  You are unable to pass gas or have a bowel movement.  You feel nauseous or you vomit.  You develop a rash.  You have redness, swelling, or pain around any incision.  You have fluid or blood coming from any incision.  Any incision feels warm to the touch.  You have pus or a bad smell coming from any incision.  You have a fever or chills. Get help right away if:  You have severe pain.  You have vomiting that does not go away.  You have heavy bleeding from the vagina.  Any incision opens.  You have trouble breathing.  You have chest pain. Summary  After the procedure, it is common to have mild discomfort in the abdomen and a sore throat.  Check your incision areas every  day for signs of infection.  Return to your normal activities as told by your health care provider. Ask your health care provider what activities are safe for you. This information is not intended to replace advice given to you by your health care provider. Make sure you discuss any questions you have with your health care provider. Document Released: 11/04/2015 Document Revised: 11/05/2017 Document Reviewed: 05/19/2017 Elsevier Patient Education  2020 [employer].

## 2019-09-14 NOTE — Anesthesia Preprocedure Evaluation (Signed)
Anesthesia Evaluation    Airway Mallampati: II  TM Distance: >3 FB Neck ROM: full    Dental  (+) Teeth Intact Upper/lower braces:   Pulmonary former smoker,    Pulmonary exam normal        Cardiovascular hypertension, Normal cardiovascular exam     Neuro/Psych  Headaches, PSYCHIATRIC DISORDERS Anxiety Depression    GI/Hepatic GERD  Controlled,  Endo/Other    Renal/GU      Musculoskeletal  (+) Arthritis ,   Abdominal   Peds  Hematology  (+) Blood dyscrasia, anemia ,   Anesthesia Other Findings   Reproductive/Obstetrics                             Anesthesia Physical Anesthesia Plan  ASA: II  Anesthesia Plan: General   Post-op Pain Management:    Induction: Intravenous  PONV Risk Score and Plan:   Airway Management Planned: LMA  Additional Equipment:   Intra-op Plan:   Post-operative Plan: Extubation in OR  Informed Consent: I have reviewed the patients History and Physical, chart, labs and discussed the procedure including the risks, benefits and alternatives for the proposed anesthesia with the patient or authorized representative who has indicated his/her understanding and acceptance.       Plan Discussed with:   Anesthesia Plan Comments:         Anesthesia Quick Evaluation

## 2019-09-14 NOTE — Anesthesia Procedure Notes (Signed)
Procedure Name: Intubation Date/Time: 09/14/2019 5:10 PM Performed by: Johnna Acosta, CRNA Pre-anesthesia Checklist: Patient identified, Emergency Drugs available, Suction available, Patient being monitored and Timeout performed Patient Re-evaluated:Patient Re-evaluated prior to induction Oxygen Delivery Method: Circle system utilized Preoxygenation: Pre-oxygenation with 100% oxygen Induction Type: IV induction Ventilation: Mask ventilation without difficulty Laryngoscope Size: Miller and 2 Grade View: Grade III Tube type: Oral Tube size: 7.0 mm Number of attempts: 2 Airway Equipment and Method: Stylet Placement Confirmation: ETT inserted through vocal cords under direct vision,  positive ETCO2 and breath sounds checked- equal and bilateral Secured at: 21 cm Tube secured with: Tape Dental Injury: Teeth and Oropharynx as per pre-operative assessment

## 2019-09-15 ENCOUNTER — Encounter: Payer: Self-pay | Admitting: Obstetrics and Gynecology

## 2019-09-15 NOTE — Anesthesia Postprocedure Evaluation (Signed)
Anesthesia Post Note  Patient: Alyssa Pearson  Procedure(s) Performed: DILATATION AND CURETTAGE /HYSTEROSCOPY, POLYPECTOMY (N/A ) CHROMOPERTUBATION (N/A ) LAPAROSCOPY DIAGNOSTIC (N/A )  Patient location during evaluation: PACU Anesthesia Type: General Level of consciousness: awake and alert Pain management: pain level controlled Vital Signs Assessment: post-procedure vital signs reviewed and stable Respiratory status: spontaneous breathing, nonlabored ventilation, respiratory function stable and patient connected to nasal cannula oxygen Cardiovascular status: blood pressure returned to baseline and stable Postop Assessment: no apparent nausea or vomiting Anesthetic complications: no     Last Vitals:  Vitals:   09/14/19 1945 09/14/19 1950  BP:  (!) 143/87  Pulse: 82 92  Resp: 15 16  Temp:  36.6 C  SpO2: 100% 98%    Last Pain:  Vitals:   09/14/19 1950  TempSrc:   PainSc: 0-No pain                 Jensen Cheramie S

## 2019-09-18 LAB — SURGICAL PATHOLOGY

## 2019-09-19 NOTE — Progress Notes (Signed)
Normal, released to mychart with note.

## 2019-09-20 ENCOUNTER — Encounter: Payer: Self-pay | Admitting: Obstetrics and Gynecology

## 2019-09-21 ENCOUNTER — Ambulatory Visit (INDEPENDENT_AMBULATORY_CARE_PROVIDER_SITE_OTHER): Payer: Medicaid Other | Admitting: Obstetrics and Gynecology

## 2019-09-21 ENCOUNTER — Encounter: Payer: Self-pay | Admitting: Obstetrics and Gynecology

## 2019-09-21 ENCOUNTER — Other Ambulatory Visit: Payer: Self-pay

## 2019-09-21 VITALS — BP 130/84 | Wt 219.0 lb

## 2019-09-21 DIAGNOSIS — K5901 Slow transit constipation: Secondary | ICD-10-CM

## 2019-09-21 DIAGNOSIS — Z9889 Other specified postprocedural states: Secondary | ICD-10-CM

## 2019-09-21 MED ORDER — DOCUSATE SODIUM 100 MG PO CAPS: 100.0000 mg | ORAL_CAPSULE | Freq: Two times a day (BID) | ORAL | 2 refills | Status: DC | PRN

## 2019-09-21 MED ORDER — SIMETHICONE 80 MG PO CHEW: 80.0000 mg | CHEWABLE_TABLET | Freq: Four times a day (QID) | ORAL | 0 refills | Status: DC | PRN

## 2019-09-21 NOTE — Patient Instructions (Signed)

## 2019-09-21 NOTE — Progress Notes (Signed)
  Postoperative Follow-up Patient presents post op from laparoscopy and operative hysteroscopy for evaluation of tubal reversal and endometrial polyp, 1 week ago.  Subjective: Patient reports marked improvement in her preop symptoms. Eating a regular diet without difficulty. Pain is controlled with current analgesics. Medications being used: ibuprofen (OTC).  Activity: sedentary. Patient reports additional symptom's since surgery of Abd pain.  Objective: BP 130/84   Wt 219 lb (99.3 kg)   BMI 37.01 kg/m  Physical Exam Constitutional:      Appearance: She is well-developed.  Genitourinary:     Vagina and uterus normal.     No lesions in the vagina.     No cervical motion tenderness.     No right or left adnexal mass present.  HENT:     Head: Normocephalic and atraumatic.  Neck:     Musculoskeletal: Neck supple.     Thyroid: No thyromegaly.  Cardiovascular:     Rate and Rhythm: Normal rate and regular rhythm.     Heart sounds: Normal heart sounds.  Pulmonary:     Effort: Pulmonary effort is normal.     Breath sounds: Normal breath sounds.  Chest:     Breasts:        Right: No inverted nipple, mass, nipple discharge or skin change.        Left: No inverted nipple, mass, nipple discharge or skin change.  Abdominal:     General: Bowel sounds are normal. There is distension.     Palpations: Abdomen is soft. There is no mass.     Tenderness: There is no abdominal tenderness. There is no guarding or rebound.     Hernia: No hernia is present.     Comments: Incisions intact, no drainage, erythema or induration.   Neurological:     Mental Status: She is alert and oriented to person, place, and time.  Skin:    General: Skin is warm and dry.  Psychiatric:        Behavior: Behavior normal.        Thought Content: Thought content normal.        Judgment: Judgment normal.  Vitals signs reviewed.    Assessment: s/p :  laparoscopy and operative hysteroscopy stable  Plan: Patient  has done well after surgery with no apparent complications.  I have discussed the post-operative course to date, and the expected progress moving forward.  The patient understands what complications to be concerned about.  I will see the patient in routine follow up, or sooner if needed.    Activity plan: No heavy lifting.  Pelvic rest.  Doing well, some constipation and gas pain Encouraged to increase ambulation, increase oral fluids and stool softner  Adrian Prows MD [place] 09/21/2019 12:12 PM

## 2019-10-09 NOTE — Telephone Encounter (Signed)
She could come in for a visit this week, please schedule- thank you

## 2019-10-10 NOTE — Telephone Encounter (Signed)
Patient is schedule 10/11/19

## 2019-10-11 ENCOUNTER — Ambulatory Visit: Payer: Medicaid Other | Admitting: Obstetrics and Gynecology

## 2019-11-06 ENCOUNTER — Telehealth: Payer: Self-pay | Admitting: Obstetrics and Gynecology

## 2019-11-06 ENCOUNTER — Other Ambulatory Visit: Payer: Self-pay | Admitting: Obstetrics and Gynecology

## 2019-11-06 DIAGNOSIS — O209 Hemorrhage in early pregnancy, unspecified: Secondary | ICD-10-CM

## 2019-11-06 DIAGNOSIS — Z9889 Other specified postprocedural states: Secondary | ICD-10-CM

## 2019-11-06 DIAGNOSIS — Z3490 Encounter for supervision of normal pregnancy, unspecified, unspecified trimester: Secondary | ICD-10-CM

## 2019-11-06 NOTE — Progress Notes (Unsigned)
[id number] 

## 2019-11-06 NOTE — Telephone Encounter (Signed)
Sent message to Clarise Cruz- I would like to try and work her in this week.

## 2019-11-06 NOTE — Telephone Encounter (Signed)
Patient is calling today with Positive pregnancy test. Patient has a HX of tubal pregnancies. Please advise we are schedule out a couple of weeks. Patient is schedule 11/23/19 at 2:30 with Dr. Glennon Mac for NOB. Please advise

## 2019-11-06 NOTE — Telephone Encounter (Signed)
Patient is schedule for Wednesday, 11/08/19 for ultrasound at [facility]

## 2019-11-06 NOTE — Telephone Encounter (Signed)
I could see her at 9:30 am tomorrow after my surgery. If there is [place] availability before that would be ideal. I could also have her added on Friday there seems to be availability that day.

## 2019-11-08 ENCOUNTER — Ambulatory Visit
Admission: RE | Admit: 2019-11-08 | Discharge: 2019-11-08 | Disposition: A | Discharge status: Home / Self Care | Payer: Medicaid Other | Source: Ambulatory Visit | Attending: Obstetrics and Gynecology | Admitting: Obstetrics and Gynecology

## 2019-11-08 ENCOUNTER — Other Ambulatory Visit: Payer: Self-pay

## 2019-11-08 DIAGNOSIS — O209 Hemorrhage in early pregnancy, unspecified: Secondary | ICD-10-CM | POA: Insufficient documentation

## 2019-11-08 DIAGNOSIS — O208 Other hemorrhage in early pregnancy: Secondary | ICD-10-CM | POA: Diagnosis not present

## 2019-11-08 DIAGNOSIS — Z3A00 Weeks of gestation of pregnancy not specified: Secondary | ICD-10-CM | POA: Diagnosis not present

## 2019-11-08 DIAGNOSIS — Z9889 Other specified postprocedural states: Secondary | ICD-10-CM | POA: Insufficient documentation

## 2019-11-08 DIAGNOSIS — Z3490 Encounter for supervision of normal pregnancy, unspecified, unspecified trimester: Secondary | ICD-10-CM | POA: Diagnosis not present

## 2019-11-08 NOTE — Progress Notes (Signed)
LMP 10/10/2019- patient will follow up in office on Friday for lab work. Patient feeling well. Reviewed Ectopic pregnancy precautions and to proceed urgently to the ER for severe abdominal pain or heavy bleeding.

## 2019-11-10 ENCOUNTER — Other Ambulatory Visit: Payer: Self-pay

## 2019-11-10 ENCOUNTER — Ambulatory Visit (INDEPENDENT_AMBULATORY_CARE_PROVIDER_SITE_OTHER): Payer: Medicaid Other | Admitting: Obstetrics and Gynecology

## 2019-11-10 ENCOUNTER — Encounter: Payer: Self-pay | Admitting: Obstetrics and Gynecology

## 2019-11-10 VITALS — BP 130/90 | Ht 64.0 in | Wt 223.0 lb

## 2019-11-10 DIAGNOSIS — O3680X0 Pregnancy with inconclusive fetal viability, not applicable or unspecified: Secondary | ICD-10-CM | POA: Diagnosis not present

## 2019-11-10 NOTE — Progress Notes (Signed)
Patient ID: Alyssa Pearson, female   DOB: August 22, 1986, 33 y.o.   MRN: [id number]  Reason for Consult: Possible Pregnancy   Referred by Raiford Simmonds., PA-C  Subjective:     HPI:  Alyssa Pearson is a 33 y.o. female She had a  Positive pregnancy test over thanksgiving. She has a history of a tubal reversal and is here today for early evaluation since she is at high risk for an ectopic pregnancy.  [place] earlier this week did not show an intrauterine or ectopic pregnancy. LMP 10/10/2019. She reports a small amount of vaginal spotting and some crampy abdominal pain.   Past Medical History:  Diagnosis Date  . Anemia   . Anxiety    h/o   . Arthritis    bil feet  . Depression    h/o  . GERD (gastroesophageal reflux disease)    h/o  . Headache    migraines  . Hypertension    History reviewed. No pertinent family history. Past Surgical History:  Procedure Laterality Date  . CHROMOPERTUBATION N/A 09/14/2019   Procedure: CHROMOPERTUBATION;  Surgeon: Homero Fellers, MD;  Location: [facility] ORS;  Service: Gynecology;  Laterality: N/A;  . HYSTEROSCOPY W/D&C N/A 09/14/2019   Procedure: DILATATION AND CURETTAGE /HYSTEROSCOPY, POLYPECTOMY;  Surgeon: Homero Fellers, MD;  Location: [facility] ORS;  Service: Gynecology;  Laterality: N/A;  . LAPAROSCOPY N/A 09/14/2019   Procedure: LAPAROSCOPY DIAGNOSTIC;  Surgeon: Homero Fellers, MD;  Location: [facility] ORS;  Service: Gynecology;  Laterality: N/A;  . TUBAL LIGATION    . Tubal reversal  08/31/2017  . WISDOM TOOTH EXTRACTION      Short Social History:  Social History   Tobacco Use  . Smoking status: Former Smoker    Packs/day: 0.50    Years: 7.00    Pack years: 3.50    Types: Cigarettes    Quit date: 09/10/2009    Years since quitting: 10.1  . Smokeless tobacco: Never Used  Substance Use Topics  . Alcohol use: No    No Known Allergies  Current Outpatient Medications  Medication Sig Dispense Refill  . amLODipine  (NORVASC) 5 MG tablet Take 1 tablet (5 mg total) by mouth daily. (Patient taking differently: Take 5 mg by mouth daily with lunch. ) 30 tablet 3  . acetaminophen (TYLENOL) 500 MG tablet Take 2 tablets (1,000 mg total) by mouth every 6 (six) hours as needed for moderate pain or headache. (Patient not taking: Reported on 11/10/2019) 30 tablet 0  . docusate sodium (COLACE) 100 MG capsule Take 1 capsule (100 mg total) by mouth 2 (two) times daily as needed. (Patient not taking: Reported on 11/10/2019) 30 capsule 2  . ibuprofen (ADVIL) 600 MG tablet Take 1 tablet (600 mg total) by mouth every 6 (six) hours as needed. (Patient not taking: Reported on 11/10/2019) 60 tablet 3  . Prenatal Vit-Fe Fumarate-FA (PRENATAL PO) Take 1 tablet by mouth daily with lunch.    . simethicone (GAS-X) 80 MG chewable tablet Chew 1 tablet (80 mg total) by mouth every 6 (six) hours as needed for flatulence. (Patient not taking: Reported on 11/10/2019) 30 tablet 0  . tretinoin (RETIN-A) 0.05 % cream Apply 1 application topically daily as needed (acne).      No current facility-administered medications for this visit.     Review of Systems  Constitutional: Negative for chills, fatigue, fever and unexpected weight change.  HENT: Negative for trouble swallowing.  Eyes: Negative for loss of vision.  Respiratory: Negative for cough, shortness of breath and wheezing.  Cardiovascular: Negative for chest pain, leg swelling, palpitations and syncope.  GI: Negative for abdominal pain, blood in stool, diarrhea, nausea and vomiting.  GU: Negative for difficulty urinating, dysuria, frequency and hematuria.  Musculoskeletal: Negative for back pain, leg pain and joint pain.  Skin: Negative for rash.  Neurological: Negative for dizziness, headaches, light-headedness, numbness and seizures.  Psychiatric: Negative for behavioral problem, confusion, depressed mood and sleep disturbance.        Objective:  Objective   Vitals:   11/10/19  1314  BP: 130/90  Weight: 223 lb (101.2 kg)  Height: 5\' 4"  (1.626 m)   Body mass index is 38.28 kg/m.  Physical Exam Vitals signs and nursing note reviewed.  Constitutional:      Appearance: She is well-developed.  HENT:     Head: Normocephalic and atraumatic.  Eyes:     Pupils: Pupils are equal, round, and reactive to light.  Cardiovascular:     Rate and Rhythm: Normal rate and regular rhythm.  Pulmonary:     Effort: Pulmonary effort is normal. No respiratory distress.  Skin:    General: Skin is warm and dry.  Neurological:     Mental Status: She is alert and oriented to person, place, and time.  Psychiatric:        Behavior: Behavior normal.        Thought Content: Thought content normal.        Judgment: Judgment normal.         Assessment/Plan:     33 yo 29 with pregnancy of unknown location, early pregnancy stage. Will  trend beta hcg and plan on repeating [id number] in 2 weeks. Discussed ectopic precautions in detail.   More than 15 minutes were spent face to face with the patient in the room with more than 50% of the time spent providing counseling and discussing the plan of management.    [place] MD [facility] 11/10/2019 1:42 PM

## 2019-11-11 LAB — BETA HCG QUANT (REF LAB): hCG Quant: 813 m[IU]/mL

## 2019-11-12 ENCOUNTER — Other Ambulatory Visit: Payer: Self-pay | Admitting: Obstetrics and Gynecology

## 2019-11-12 DIAGNOSIS — O3680X0 Pregnancy with inconclusive fetal viability, not applicable or unspecified: Secondary | ICD-10-CM

## 2019-11-13 ENCOUNTER — Other Ambulatory Visit: Payer: Self-pay | Admitting: Obstetrics and Gynecology

## 2019-11-13 ENCOUNTER — Other Ambulatory Visit: Payer: Medicaid Other

## 2019-11-13 ENCOUNTER — Other Ambulatory Visit: Payer: Self-pay

## 2019-11-13 DIAGNOSIS — O3680X0 Pregnancy with inconclusive fetal viability, not applicable or unspecified: Secondary | ICD-10-CM

## 2019-11-14 LAB — BETA HCG QUANT (REF LAB): hCG Quant: 2764 m[IU]/mL

## 2019-11-14 NOTE — Progress Notes (Signed)
WNL- released to mychart

## 2019-11-15 ENCOUNTER — Other Ambulatory Visit: Payer: Self-pay | Admitting: Obstetrics and Gynecology

## 2019-11-15 ENCOUNTER — Other Ambulatory Visit: Payer: Self-pay

## 2019-11-15 ENCOUNTER — Other Ambulatory Visit: Payer: Medicaid Other

## 2019-11-15 DIAGNOSIS — O3680X0 Pregnancy with inconclusive fetal viability, not applicable or unspecified: Secondary | ICD-10-CM | POA: Diagnosis not present

## 2019-11-16 LAB — BETA HCG QUANT (REF LAB): hCG Quant: 4888 m[IU]/mL

## 2019-11-23 ENCOUNTER — Ambulatory Visit (INDEPENDENT_AMBULATORY_CARE_PROVIDER_SITE_OTHER): Payer: Medicaid Other | Admitting: Obstetrics and Gynecology

## 2019-11-23 ENCOUNTER — Ambulatory Visit (INDEPENDENT_AMBULATORY_CARE_PROVIDER_SITE_OTHER): Payer: Medicaid Other

## 2019-11-23 ENCOUNTER — Other Ambulatory Visit: Payer: Self-pay

## 2019-11-23 ENCOUNTER — Encounter: Payer: Self-pay | Admitting: Obstetrics and Gynecology

## 2019-11-23 VITALS — BP 122/78 | Ht 64.0 in | Wt 228.0 lb

## 2019-11-23 DIAGNOSIS — O26891 Other specified pregnancy related conditions, first trimester: Secondary | ICD-10-CM | POA: Diagnosis not present

## 2019-11-23 DIAGNOSIS — R519 Headache, unspecified: Secondary | ICD-10-CM | POA: Diagnosis not present

## 2019-11-23 DIAGNOSIS — O3680X0 Pregnancy with inconclusive fetal viability, not applicable or unspecified: Secondary | ICD-10-CM

## 2019-11-23 DIAGNOSIS — Z3687 Encounter for antenatal screening for uncertain dates: Secondary | ICD-10-CM | POA: Diagnosis not present

## 2019-11-23 DIAGNOSIS — Z9889 Other specified postprocedural states: Secondary | ICD-10-CM

## 2019-11-23 MED ORDER — PROCHLORPERAZINE MALEATE 10 MG PO TABS: 10.0000 mg | ORAL_TABLET | Freq: Three times a day (TID) | ORAL | 3 refills | Status: DC | PRN

## 2019-11-23 NOTE — Progress Notes (Signed)
Gynecology Ultrasound Follow Up   Chief Complaint  Patient presents with  . Follow-up  Possible pregnancy, history of tubal reversal  History of Present Illness: Patient is a 33 y.o. female who presents today for ultrasound evaluation of the above .  Ultrasound demonstrates the following findings Single, living intrauterine pregnancy with CRL consistent with [redacted]w[redacted]d gestational age giving an EDD of 07/14/2020.   Cardiac activity normal at 122 bpm.  No corpus luteal cyst visualized. No other concerning findings.   Past Medical History:  Diagnosis Date  . Anemia   . Anxiety    h/o   . Arthritis    bil feet  . Depression    h/o  . GERD (gastroesophageal reflux disease)    h/o  . Headache    migraines  . Hypertension     Past Surgical History:  Procedure Laterality Date  . CHROMOPERTUBATION N/A 09/14/2019   Procedure: CHROMOPERTUBATION;  Surgeon: Natale Milch, MD;  Location: [facility] ORS;  Service: Gynecology;  Laterality: N/A;  . HYSTEROSCOPY WITH D & C N/A 09/14/2019   Procedure: DILATATION AND CURETTAGE /HYSTEROSCOPY, POLYPECTOMY;  Surgeon: Natale Milch, MD;  Location: [facility] ORS;  Service: Gynecology;  Laterality: N/A;  . LAPAROSCOPY N/A 09/14/2019   Procedure: LAPAROSCOPY DIAGNOSTIC;  Surgeon: Natale Milch, MD;  Location: [facility] ORS;  Service: Gynecology;  Laterality: N/A;  . TUBAL LIGATION    . Tubal reversal  08/31/2017  . WISDOM TOOTH EXTRACTION     History reviewed. No pertinent family history.  Social History   Socioeconomic History  . Marital status: Significant Other    Spouse name: Not on file  . Number of children: Not on file  . Years of education: Not on file  . Highest education level: Not on file  Occupational History  . Not on file  Tobacco Use  . Smoking status: Former Smoker    Packs/day: 0.50    Years: 7.00    Pack years: 3.50    Types: Cigarettes    Quit date: 09/10/2009    Years since quitting: 10.2  . Smokeless tobacco:  Never Used  Substance and Sexual Activity  . Alcohol use: No  . Drug use: No  . Sexual activity: Yes    Birth control/protection: None  Other Topics Concern  . Not on file  Social History Narrative  . Not on file   Social Determinants of Health   Financial Resource Strain:   . Difficulty of Paying Living Expenses: Not on file  Food Insecurity:   . Worried About Programme researcher, broadcasting/film/video in the Last Year: Not on file  . Ran Out of Food in the Last Year: Not on file  Transportation Needs:   . Lack of Transportation (Medical): Not on file  . Lack of Transportation (Non-Medical): Not on file  Physical Activity:   . Days of Exercise per Week: Not on file  . Minutes of Exercise per Session: Not on file  Stress:   . Feeling of Stress : Not on file  Social Connections:   . Frequency of Communication with Friends and Family: Not on file  . Frequency of Social Gatherings with Friends and Family: Not on file  . Attends Religious Services: Not on file  . Active Member of Clubs or Organizations: Not on file  . Attends Banker Meetings: Not on file  . Marital Status: Not on file  Intimate Partner Violence:   . Fear of Current or Ex-Partner: Not on file  .  Emotionally Abused: Not on file  . Physically Abused: Not on file  . Sexually Abused: Not on file    No Known Allergies  Prior to Admission medications   Medication Sig Start Date End Date Taking? Authorizing Provider  amLODipine (NORVASC) 5 MG tablet Take 1 tablet (5 mg total) by mouth daily. Patient taking differently: Take 5 mg by mouth daily with lunch.  08/24/19   Schuman, Stefanie Libel, MD  Prenatal Vit-Fe Fumarate-FA (PRENATAL PO) Take 1 tablet by mouth daily with lunch.    [provider]    Physical Exam BP 122/78   Ht 5\' 4"  (1.626 m)   Wt 228 lb (103.4 kg)   LMP 10/10/2019   BMI 39.14 kg/m    General: NAD HEENT: normocephalic, anicteric Pulmonary: No increased work of breathing Extremities: no  edema, erythema, or tenderness Neurologic: Grossly intact, normal gait Psychiatric: mood appropriate, affect full  Imaging Results US OB Comp Less 14 Wks  Result Date: 11/23/2019 Patient Name: Alyssa Pearson DOB: 1986-07-17 MRN: [id number] ULTRASOUND REPORT Location: [facility] Date of Service: 11/23/2019 Indications:dating/viability Findings: Nelda Marseille intrauterine pregnancy is visualized with a CRL consistent with [redacted]w[redacted]d gestation, giving an (U/S) EDD of 07/14/2020. The (U/S) EDD is consistent with the clinically established EDD of 07/16/2020. FHR: 122 BPM CRL measurement: 6.5 mm Yolk sac is visualized and appears normal. Amnion: visualized and appears normal Right Ovary is normal in appearance. Left Ovary is not visible. Corpus luteal cyst:  is not visualized Survey of the adnexa demonstrates no adnexal masses. There is no free peritoneal fluid in the cul de sac. Impression: 1. [redacted]w[redacted]d Viable Singleton Intrauterine pregnancy by U/S. 2. (U/S) EDD is consistent with Clinically established EDD of 07/16/2020. Gweneth Dimitri, RT There is a viable singleton gestation.  Detailed evaluation of the fetal anatomy is precluded by early gestational age.  It must be noted that a normal ultrasound particular at this early gestational age is unable to rule out fetal aneuploidy, risk of first trimester miscarriage, or anatomic birth defects. Prentice Docker, MD, [facility] 11/23/2019 3:04 PM      Assessment: 33 y.o. [id number]  1. History of reversal of tubal ligation   2. Pregnancy headache in first trimester      Plan: Problem List Items Addressed This Visit      Other   History of reversal of tubal ligation - Primary    Other Visit Diagnoses    Pregnancy headache in first trimester       Relevant Medications   prochlorperazine (COMPAZINE) 10 MG tablet     Discussed findings of ultrasound and medications she's taking. She has no issues today apart from  occasional nausea and last night she had a severe headache. She will establish care with a NOB appt. Will send in compazine for nausea/headache to be taken with Benadryl.  15 minutes spent in face to face discussion with > 50% spent in counseling,management, and coordination of care of her history of tubal ligation and pregnancy headache in first trimester.   Prentice Docker, MD, [facility], [facility] 11/23/2019 3:28 PM

## 2019-11-23 NOTE — Patient Instructions (Signed)
For severe headache: May take compazine 10 mg every 8 hours as needed Along with Benadryl 25-50 mg (can buy over the counter) as needed

## 2019-12-13 ENCOUNTER — Other Ambulatory Visit: Payer: Self-pay

## 2019-12-13 ENCOUNTER — Emergency Department: Payer: Medicaid Other

## 2019-12-13 ENCOUNTER — Telehealth: Payer: Self-pay

## 2019-12-13 ENCOUNTER — Encounter: Payer: Self-pay | Admitting: Emergency Medicine

## 2019-12-13 ENCOUNTER — Emergency Department
Admission: EM | Admit: 2019-12-13 | Discharge: 2019-12-13 | Disposition: A | Discharge status: Home / Self Care | Payer: Medicaid Other | Attending: Student | Admitting: Student

## 2019-12-13 DIAGNOSIS — R102 Pelvic and perineal pain: Secondary | ICD-10-CM

## 2019-12-13 DIAGNOSIS — O10011 Pre-existing essential hypertension complicating pregnancy, first trimester: Secondary | ICD-10-CM | POA: Insufficient documentation

## 2019-12-13 DIAGNOSIS — O208 Other hemorrhage in early pregnancy: Secondary | ICD-10-CM | POA: Diagnosis not present

## 2019-12-13 DIAGNOSIS — O26891 Other specified pregnancy related conditions, first trimester: Secondary | ICD-10-CM | POA: Diagnosis not present

## 2019-12-13 DIAGNOSIS — Z3A08 8 weeks gestation of pregnancy: Secondary | ICD-10-CM | POA: Diagnosis not present

## 2019-12-13 DIAGNOSIS — Z79899 Other long term (current) drug therapy: Secondary | ICD-10-CM | POA: Insufficient documentation

## 2019-12-13 DIAGNOSIS — Z87891 Personal history of nicotine dependence: Secondary | ICD-10-CM | POA: Insufficient documentation

## 2019-12-13 DIAGNOSIS — R109 Unspecified abdominal pain: Secondary | ICD-10-CM | POA: Diagnosis not present

## 2019-12-13 DIAGNOSIS — O021 Missed abortion: Secondary | ICD-10-CM | POA: Diagnosis not present

## 2019-12-13 LAB — ABO/RH: ABO/RH(D): A POS

## 2019-12-13 LAB — CBC
HCT: 33.3 % — ABNORMAL LOW (ref 36.0–46.0)
Hemoglobin: 10.9 g/dL — ABNORMAL LOW (ref 12.0–15.0)
MCH: 27.9 pg (ref 26.0–34.0)
MCHC: 32.7 g/dL (ref 30.0–36.0)
MCV: 85.4 fL (ref 80.0–100.0)
Platelets: 298 10*3/uL (ref 150–400)
RBC: 3.9 MIL/uL (ref 3.87–5.11)
RDW: 16.1 % — ABNORMAL HIGH (ref 11.5–15.5)
WBC: 12.3 10*3/uL — ABNORMAL HIGH (ref 4.0–10.5)
nRBC: 0 % (ref 0.0–0.2)

## 2019-12-13 LAB — COMPREHENSIVE METABOLIC PANEL
ALT: 13 U/L (ref 0–44)
AST: 15 U/L (ref 15–41)
Albumin: 3.8 g/dL (ref 3.5–5.0)
Alkaline Phosphatase: 63 U/L (ref 38–126)
Anion gap: 11 (ref 5–15)
BUN: 8 mg/dL (ref 6–20)
CO2: 23 mmol/L (ref 22–32)
Calcium: 9.2 mg/dL (ref 8.9–10.3)
Chloride: 99 mmol/L (ref 98–111)
Creatinine, Ser: 0.61 mg/dL (ref 0.44–1.00)
GFR calc Af Amer: >60 mL/min (ref >60)
GFR calc non Af Amer: >60 mL/min (ref >60)
Glucose, Bld: 134 mg/dL — ABNORMAL HIGH (ref 70–99)
Potassium: 3.4 mmol/L — ABNORMAL LOW (ref 3.5–5.1)
Sodium: 133 mmol/L — ABNORMAL LOW (ref 135–145)
Total Bilirubin: 0.5 mg/dL (ref 0.3–1.2)
Total Protein: 7.8 g/dL (ref 6.5–8.1)

## 2019-12-13 LAB — URINALYSIS, COMPLETE (UACMP) WITH MICROSCOPIC
Bilirubin Urine: NEGATIVE
Glucose, UA: NEGATIVE mg/dL
Hgb urine dipstick: NEGATIVE
Ketones, ur: NEGATIVE mg/dL
Nitrite: NEGATIVE
Protein, ur: NEGATIVE mg/dL
Specific Gravity, Urine: 1.021 (ref 1.005–1.030)
pH: 7 (ref 5.0–8.0)

## 2019-12-13 LAB — SAMPLE TO BLOOD BANK

## 2019-12-13 LAB — HCG, QUANTITATIVE, PREGNANCY: hCG, Beta Chain, Quant, S: 55344 m[IU]/mL — ABNORMAL HIGH (ref <5)

## 2019-12-13 LAB — LIPASE, BLOOD: Lipase: 21 U/L (ref 11–51)

## 2019-12-13 MED ORDER — METOCLOPRAMIDE HCL 10 MG PO TABS
10.0000 mg | ORAL_TABLET | Freq: Once | ORAL | Status: AC
  Administered 2019-12-13: 10 mg via ORAL
  Filled 2019-12-13: qty 1

## 2019-12-13 NOTE — Discharge Instructions (Addendum)
I am so sorry for your loss.  Please follow-up with Dr. Tiburcio Pea, call tomorrow to set up a time.

## 2019-12-13 NOTE — ED Provider Notes (Signed)
[facility] Emergency Department Provider Note ____________________________________________  Time seen: 1645  I have reviewed the triage vital signs and the nursing notes.  HISTORY  Chief Complaint  Abdominal Cramping and Vaginal Bleeding   HPI Alyssa Pearson is a 34 y.o. female [phone] presents to the ED for evaluation of LLQ pelvic discomfort and scant vaginal bleeding for the last 2 days. She denies active bleeding, noting only bright blood-tinged mucous on the toilet tissue. She is also denying vaginal discharge, vomiting, fevers, or urinary symptoms. She is reportedly at [redacted] weeks gestation with a single IUP, confirmed by [place] at [facility] following a positive pregnancy test. She is s/p a tubal reversal.    Past Medical History:  Diagnosis Date  . Anemia   . Anxiety    h/o   . Arthritis    bil feet  . Depression    h/o  . GERD (gastroesophageal reflux disease)    h/o  . Headache    migraines  . Hypertension     Patient Active Problem List   Diagnosis Date Noted  . History of reversal of tubal ligation 09/17/2017    Past Surgical History:  Procedure Laterality Date  . CHROMOPERTUBATION N/A 09/14/2019   Procedure: CHROMOPERTUBATION;  Surgeon: Natale Milch, MD;  Location: [facility] ORS;  Service: Gynecology;  Laterality: N/A;  . HYSTEROSCOPY WITH D & C N/A 09/14/2019   Procedure: DILATATION AND CURETTAGE /HYSTEROSCOPY, POLYPECTOMY;  Surgeon: Natale Milch, MD;  Location: [facility] ORS;  Service: Gynecology;  Laterality: N/A;  . LAPAROSCOPY N/A 09/14/2019   Procedure: LAPAROSCOPY DIAGNOSTIC;  Surgeon: Natale Milch, MD;  Location: [facility] ORS;  Service: Gynecology;  Laterality: N/A;  . TUBAL LIGATION    . Tubal reversal  08/31/2017  . WISDOM TOOTH EXTRACTION      Prior to Admission medications   Medication Sig Start Date End Date Taking? Authorizing Provider  acetaminophen (TYLENOL) 500 MG tablet Take 2 tablets (1,000 mg total) by  mouth every 6 (six) hours as needed for moderate pain or headache. Patient not taking: Reported on 11/10/2019 09/14/19   Adelene Idler R, MD  amLODipine (NORVASC) 5 MG tablet Take 1 tablet (5 mg total) by mouth daily. Patient taking differently: Take 5 mg by mouth daily with lunch.  08/24/19   Schuman, Jaquelyn Bitter, MD  docusate sodium (COLACE) 100 MG capsule Take 1 capsule (100 mg total) by mouth 2 (two) times daily as needed. Patient not taking: Reported on 11/10/2019 09/21/19   Natale Milch, MD  ibuprofen (ADVIL) 600 MG tablet Take 1 tablet (600 mg total) by mouth every 6 (six) hours as needed. Patient not taking: Reported on 11/10/2019 09/14/19   Natale Milch, MD  Prenatal Vit-Fe Fumarate-FA (PRENATAL PO) Take 1 tablet by mouth daily with lunch.    [provider]  prochlorperazine (COMPAZINE) 10 MG tablet Take 1 tablet (10 mg total) by mouth every 8 (eight) hours as needed for nausea or vomiting (headache). 11/23/19   Conard Novak, MD  simethicone (GAS-X) 80 MG chewable tablet Chew 1 tablet (80 mg total) by mouth every 6 (six) hours as needed for flatulence. Patient not taking: Reported on 11/10/2019 09/21/19   Natale Milch, MD  tretinoin (RETIN-A) 0.05 % cream Apply 1 application topically daily as needed (acne).  06/08/18   [provider]    Allergies Patient has no known allergies.  History reviewed. No pertinent family history.  Social History Social History   Tobacco  Use  . Smoking status: Former Smoker    Packs/day: 0.50    Years: 7.00    Pack years: 3.50    Types: Cigarettes    Quit date: 09/10/2009    Years since quitting: 10.2  . Smokeless tobacco: Never Used  Substance Use Topics  . Alcohol use: No  . Drug use: No    Review of Systems  Constitutional: Negative for fever. Cardiovascular: Negative for chest pain. Respiratory: Negative for shortness of breath. Gastrointestinal: Negative for abdominal pain, vomiting  and diarrhea. Genitourinary: Negative for dysuria. Reports BRB on toilet tissue Musculoskeletal: Negative for back pain. Skin: Negative for rash. Neurological: Negative for headaches, focal weakness or numbness. ____________________________________________  PHYSICAL EXAM:  VITAL SIGNS: ED Triage Vitals  Enc Vitals Group     BP 12/13/19 1541 (!) 157/106     Pulse Rate 12/13/19 1541 (!) 104     Resp 12/13/19 1541 18     Temp 12/13/19 1541 99 F (37.2 C)     Temp Source 12/13/19 1541 Oral     SpO2 12/13/19 1541 99 %     Weight 12/13/19 1534 228 lb (103.4 kg)     Height 12/13/19 1534 5\' 4"  (1.626 m)     Head Circumference --      Peak Flow --      Pain Score 12/13/19 1533 7     Pain Loc --      Pain Edu? --      Excl. in [place]? --     Constitutional: Alert and oriented. Well appearing and in no distress. Head: Normocephalic and atraumatic. Eyes: Conjunctivae are normal. Normal extraocular movements Cardiovascular: Normal rate, regular rhythm. Normal distal pulses. Respiratory: Normal respiratory effort. No wheezes/rales/rhonchi. Gastrointestinal: Soft and nontender. No distention. No CVA tenderness GU: Deferred Musculoskeletal: Nontender with normal range of motion in all extremities.  Neurologic:  Normal gait without ataxia. Normal speech and language. No gross focal neurologic deficits are appreciated. Skin:  Skin is warm, dry and intact. No rash noted. ____________________________________________   LABS (pertinent positives/negatives) Labs Reviewed  COMPREHENSIVE METABOLIC PANEL - Abnormal; Notable for the following components:      Result Value   Sodium 133 (*)    Potassium 3.4 (*)    Glucose, Bld 134 (*)    All other components within normal limits  CBC - Abnormal; Notable for the following components:   WBC 12.3 (*)    Hemoglobin 10.9 (*)    HCT 33.3 (*)    RDW 16.1 (*)    All other components within normal limits  URINALYSIS, COMPLETE (UACMP) WITH MICROSCOPIC -  Abnormal; Notable for the following components:   Color, Urine YELLOW (*)    APPearance HAZY (*)    Leukocytes,Ua MODERATE (*)    Bacteria, UA RARE (*)    All other components within normal limits  HCG, QUANTITATIVE, PREGNANCY - Abnormal; Notable for the following components:   hCG, Beta Chain, Quant, S 55,344 (*)    All other components within normal limits  LIPASE, BLOOD  ABO/RH  SAMPLE TO BLOOD BANK  ____________________________________________   RADIOLOGY  OB w/ Transvag <14 wks  IMPRESSION: Single IUP at 8 weeks and 3 days without evidence for cardiac activity.  Findings meet definitive criteria for failed pregnancy. This follows SRU consensus guidelines: Diagnostic Criteria for Nonviable Pregnancy Early in the First Trimester. Alison Stalling J Med [phone]. ____________________________________________  PROCEDURES  Reglan 10 mg PO Procedures ____________________________________________  INITIAL IMPRESSION / ASSESSMENT AND PLAN /  ED COURSE  Patient with recently confirmed IUP presents to the ED for evaluation vaginal bleeding. She is expected to be at [redacted] weeks GA based on LMP. Her [place] today, unfortunately, reveals a failed IUP, without evidence of cardiac activity. She is notified of the results with condolences. She will follow-up as discussed.   ----------------------------------------- 5:58 PM on 12/13/2019 ----------------------------------------- Page to Dr. Tiburcio Pea [facility]):  Expectant management. Have patient follow-up in the office this week.   Alyssa Pearson was evaluated in Emergency Department on 12/13/2019 for the symptoms described in the history of present illness. She was evaluated in the context of the global COVID-19 pandemic, which necessitated consideration that the patient might be at risk for infection with the SARS-CoV-2 virus that causes COVID-19. Institutional protocols and algorithms that pertain to the evaluation of patients at risk for COVID-19  are in a state of rapid change based on information released by regulatory bodies including the CDC and federal and state organizations. These policies and algorithms were followed during the patient's care in the ED. ____________________________________________  FINAL CLINICAL IMPRESSION(S) / ED DIAGNOSES  Final diagnoses:  Missed abortion with fetal demise before 20 completed weeks of gestation      Lissa Hoard, Cordelia Poche 12/13/19 1906    Miguel Aschoff., MD 12/13/19 [phone]

## 2019-12-13 NOTE — ED Triage Notes (Signed)
Pt reports is [redacted] weeks pregnant and has had some abd discomfort and vaginal bleeding for the past 2 days.

## 2019-12-13 NOTE — ED Notes (Signed)
Patient transported to Ultrasound 

## 2019-12-13 NOTE — Telephone Encounter (Signed)
Patient states she is [redacted] wks pregnant. Pt noticed pink/red d/c, no pain or cramping. Inquiring what to do. Cb#[id number]

## 2019-12-13 NOTE — Telephone Encounter (Signed)
Reviewed chart. Patient went to ED. Spoke w/patient, advised she is in best place to have assessment and they will advise her on proper follow up. Patient appreciative of returned call.

## 2019-12-15 ENCOUNTER — Ambulatory Visit: Payer: Medicaid Other | Admitting: Obstetrics & Gynecology

## 2019-12-18 ENCOUNTER — Encounter: Payer: Self-pay | Admitting: Obstetrics and Gynecology

## 2019-12-18 ENCOUNTER — Ambulatory Visit (INDEPENDENT_AMBULATORY_CARE_PROVIDER_SITE_OTHER): Payer: Medicaid Other | Admitting: Obstetrics and Gynecology

## 2019-12-18 ENCOUNTER — Other Ambulatory Visit
Admission: RE | Admit: 2019-12-18 | Discharge: 2019-12-18 | Disposition: A | Discharge status: Home / Self Care | Payer: Medicaid Other | Source: Ambulatory Visit | Attending: Obstetrics and Gynecology | Admitting: Obstetrics and Gynecology

## 2019-12-18 ENCOUNTER — Other Ambulatory Visit: Payer: Self-pay

## 2019-12-18 ENCOUNTER — Telehealth: Payer: Self-pay | Admitting: Obstetrics and Gynecology

## 2019-12-18 VITALS — BP 162/100 | Wt 232.0 lb

## 2019-12-18 DIAGNOSIS — Z01812 Encounter for preprocedural laboratory examination: Secondary | ICD-10-CM | POA: Insufficient documentation

## 2019-12-18 DIAGNOSIS — Z20822 Contact with and (suspected) exposure to covid-19: Secondary | ICD-10-CM | POA: Diagnosis not present

## 2019-12-18 DIAGNOSIS — O021 Missed abortion: Secondary | ICD-10-CM

## 2019-12-18 LAB — SARS CORONAVIRUS 2 (TAT 6-24 HRS): SARS Coronavirus 2: NEGATIVE

## 2019-12-18 MED ORDER — DOXYCYCLINE HYCLATE 100 MG IV SOLR
200.0000 mg | INTRAVENOUS | Status: AC
  Administered 2019-12-19: 13:00:00 200 mg via INTRAVENOUS
  Filled 2019-12-18: qty 200

## 2019-12-18 NOTE — Telephone Encounter (Signed)
-----   Message from Natale Milch, MD sent at 12/18/2019  9:59 AM EST ----- Surgery Booking Request Patient Full Name:  Alyssa Pearson  MRN: [id number]  DOB: 1986/05/13  Surgeon: Natale Milch, MD  Requested Surgery Date and Time: 12/19/2019 Primary Diagnosis AND Code: missed abortion O02. 1 Secondary Diagnosis and Code:  Surgical Procedure: Suction D&C L&D Notification: No Admission Status: same day surgery Length of Surgery: 1 hour Special Case Needs: No H&P: No Phone Interview???:  Yes Interpreter: No Language:  Medical Clearance:  No Special Scheduling Instructions: none Any known health/anesthesia issues, diabetes, sleep apnea, latex allergy, defibrillator/pacemaker?: No Acuity: P2   (P1 highest, P2 delay may cause harm, P3 low, elective gyn, P4 lowest)

## 2019-12-18 NOTE — H&P (View-Only) (Signed)
Patient ID: Alyssa Pearson, female   DOB: Jan 11, 1986, 34 y.o.   MRN: [id number]  Reason for Consult: Initial Prenatal Visit   Referred by Tylene Fantasia., PA-C  Subjective:     HPI:  Alyssa Pearson is a 34 y.o. female. She rpesents today for ER follow up. She was seen on 12/12/2018 . She had a small amount of vaginal spotting and was diagnosed with a missed abortion. Since that time she has had some nausea and vomiting followed by brief abdominal pain. No further vaginal bleeding.   Obstetrical History [id number] OB History  Gravida Para Term Preterm AB Living  6 3 3   2 3   SAB TAB Ectopic Multiple Live Births  2       3    # Outcome Date GA Lbr Len/2nd Weight Sex Delivery Anes PTL Lv  6 Current           5 Term 11/04/09 [redacted]w[redacted]d  7 lb 10 oz (3.459 kg) M Vag-Spont   LIV  4 Term 02/19/08 [redacted]w[redacted]d  7 lb 2 oz (3.232 kg) F Vag-Spont   LIV  3 Term 04/30/05 [redacted]w[redacted]d  6 lb 2 oz (2.778 kg) F Vag-Spont   LIV  2 SAB           1 SAB              Past Medical History:  Diagnosis Date  . Anemia   . Anxiety    h/o   . Arthritis    bil feet  . Depression    h/o  . GERD (gastroesophageal reflux disease)    h/o  . Headache    migraines  . Hypertension    No family history on file. Past Surgical History:  Procedure Laterality Date  . CHROMOPERTUBATION N/A 09/14/2019   Procedure: CHROMOPERTUBATION;  Surgeon: 11/14/2019, MD;  Location: [facility] ORS;  Service: Gynecology;  Laterality: N/A;  . HYSTEROSCOPY WITH D & C N/A 09/14/2019   Procedure: DILATATION AND CURETTAGE /HYSTEROSCOPY, POLYPECTOMY;  Surgeon: 11/14/2019, MD;  Location: [facility] ORS;  Service: Gynecology;  Laterality: N/A;  . LAPAROSCOPY N/A 09/14/2019   Procedure: LAPAROSCOPY DIAGNOSTIC;  Surgeon: 11/14/2019, MD;  Location: [facility] ORS;  Service: Gynecology;  Laterality: N/A;  . TUBAL LIGATION    . Tubal reversal  08/31/2017  . WISDOM TOOTH EXTRACTION      Short Social History:  Social History    Tobacco Use  . Smoking status: Former Smoker    Packs/day: 0.50    Years: 7.00    Pack years: 3.50    Types: Cigarettes    Quit date: 09/10/2009    Years since quitting: 10.2  . Smokeless tobacco: Never Used  Substance Use Topics  . Alcohol use: No    No Known Allergies  Current Outpatient Medications  Medication Sig Dispense Refill  . Prenatal Vit-Fe Fumarate-FA (PRENATAL PO) Take 1 tablet by mouth daily with lunch.    11/10/2009 acetaminophen (TYLENOL) 500 MG tablet Take 2 tablets (1,000 mg total) by mouth every 6 (six) hours as needed for moderate pain or headache. (Patient not taking: Reported on 11/10/2019) 30 tablet 0  . amLODipine (NORVASC) 5 MG tablet Take 1 tablet (5 mg total) by mouth daily. (Patient not taking: Reported on 12/18/2019) 30 tablet 3  . docusate sodium (COLACE) 100 MG capsule Take 1 capsule (100 mg total) by mouth 2 (two) times daily as needed. (Patient not taking: Reported on 11/10/2019) 30  capsule 2  . ibuprofen (ADVIL) 600 MG tablet Take 1 tablet (600 mg total) by mouth every 6 (six) hours as needed. (Patient not taking: Reported on 11/10/2019) 60 tablet 3  . prochlorperazine (COMPAZINE) 10 MG tablet Take 1 tablet (10 mg total) by mouth every 8 (eight) hours as needed for nausea or vomiting (headache). (Patient not taking: Reported on 12/18/2019) 30 tablet 3  . simethicone (GAS-X) 80 MG chewable tablet Chew 1 tablet (80 mg total) by mouth every 6 (six) hours as needed for flatulence. (Patient not taking: Reported on 11/10/2019) 30 tablet 0  . tretinoin (RETIN-A) 0.05 % cream Apply 1 application topically daily as needed (acne).      No current facility-administered medications for this visit.    Review of Systems  Constitutional: Negative for chills, fatigue, fever and unexpected weight change.  HENT: Negative for trouble swallowing.  Eyes: Negative for loss of vision.  Respiratory: Negative for cough, shortness of breath and wheezing.  Cardiovascular: Negative for  chest pain, leg swelling, palpitations and syncope.  GI: Negative for abdominal pain, blood in stool, diarrhea, nausea and vomiting.  GU: Negative for difficulty urinating, dysuria, frequency and hematuria.  Musculoskeletal: Negative for back pain, leg pain and joint pain.  Skin: Negative for rash.  Neurological: Negative for dizziness, headaches, light-headedness, numbness and seizures.  Psychiatric: Negative for behavioral problem, confusion, depressed mood and sleep disturbance.        Objective:  Objective   Vitals:   12/18/19 0900  BP: (!) 162/100  Weight: 232 lb (105.2 kg)   Body mass index is 39.82 kg/m.  Physical Exam Vitals and nursing note reviewed.  Constitutional:      Appearance: She is well-developed.  HENT:     Head: Normocephalic and atraumatic.  Cardiovascular:     Rate and Rhythm: Normal rate and regular rhythm.  Pulmonary:     Effort: Pulmonary effort is normal.     Breath sounds: Normal breath sounds.  Abdominal:     General: Bowel sounds are normal.     Palpations: Abdomen is soft.  Musculoskeletal:        General: Normal range of motion.  Skin:    General: Skin is warm and dry.  Neurological:     Mental Status: She is alert and oriented to person, place, and time.  Psychiatric:        Behavior: Behavior normal.        Thought Content: Thought content normal.        Judgment: Judgment normal.    Bedside TVUS: Intrauterine pregnancy, measuring 7 weeks 4 days, absent fetal heart beat consistent with findings of prior US of demise.       Assessment/Plan:    33 yo with missed abortion at 9 weeks by LMP.  Discussed options for surgical vs medical vs expectant management.  Patient elected for surgical management.  Consented for this procedure. Discussed the risks of bleeding, infections, and damage to pelvic tissues and organs.  Will plan for procedure tomorrow. Consents signed.   More than 25 minutes were spent face to face with the patient in  the room with more than 50% of the time spent providing counseling and discussing the plan of management.   Camron Monday MD [facility], [facility] 12/18/2019 9:22 AM    

## 2019-12-18 NOTE — Telephone Encounter (Signed)
Yes she picks it up them usually drinks it early in the am instead of having breakfast

## 2019-12-18 NOTE — Telephone Encounter (Signed)
Patient is aware to arrive at 10:15am at the [facility] nothing to eat or drink after midnight, and per Morrie Sheldon at [employer] do not take any medications, bring them with her to the OR and they will let her know what she can take. Patient wants to know if she is still supposed to have a carb drink Dr. Jerene Pitch mentioned.

## 2019-12-18 NOTE — Progress Notes (Signed)
Patient ID: Alyssa Pearson, female   DOB: Jan 11, 1986, 34 y.o.   MRN: [id number]  Reason for Consult: Initial Prenatal Visit   Referred by Tylene Fantasia., PA-C  Subjective:     HPI:  Alyssa Pearson is a 34 y.o. female. She rpesents today for ER follow up. She was seen on 12/12/2018 . She had a small amount of vaginal spotting and was diagnosed with a missed abortion. Since that time she has had some nausea and vomiting followed by brief abdominal pain. No further vaginal bleeding.   Obstetrical History [id number] OB History  Gravida Para Term Preterm AB Living  6 3 3   2 3   SAB TAB Ectopic Multiple Live Births  2       3    # Outcome Date GA Lbr Len/2nd Weight Sex Delivery Anes PTL Lv  6 Current           5 Term 11/04/09 [redacted]w[redacted]d  7 lb 10 oz (3.459 kg) M Vag-Spont   LIV  4 Term 02/19/08 [redacted]w[redacted]d  7 lb 2 oz (3.232 kg) F Vag-Spont   LIV  3 Term 04/30/05 [redacted]w[redacted]d  6 lb 2 oz (2.778 kg) F Vag-Spont   LIV  2 SAB           1 SAB              Past Medical History:  Diagnosis Date  . Anemia   . Anxiety    h/o   . Arthritis    bil feet  . Depression    h/o  . GERD (gastroesophageal reflux disease)    h/o  . Headache    migraines  . Hypertension    No family history on file. Past Surgical History:  Procedure Laterality Date  . CHROMOPERTUBATION N/A 09/14/2019   Procedure: CHROMOPERTUBATION;  Surgeon: 11/14/2019, MD;  Location: [facility] ORS;  Service: Gynecology;  Laterality: N/A;  . HYSTEROSCOPY WITH D & C N/A 09/14/2019   Procedure: DILATATION AND CURETTAGE /HYSTEROSCOPY, POLYPECTOMY;  Surgeon: 11/14/2019, MD;  Location: [facility];  Service: Gynecology;  Laterality: N/A;  . LAPAROSCOPY N/A 09/14/2019   Procedure: LAPAROSCOPY DIAGNOSTIC;  Surgeon: 11/14/2019, MD;  Location: [facility];  Service: Gynecology;  Laterality: N/A;  . TUBAL LIGATION    . Tubal reversal  08/31/2017  . WISDOM TOOTH EXTRACTION      Short Social History:  Social History    Tobacco Use  . Smoking status: Former Smoker    Packs/day: 0.50    Years: 7.00    Pack years: 3.50    Types: Cigarettes    Quit date: 09/10/2009    Years since quitting: 10.2  . Smokeless tobacco: Never Used  Substance Use Topics  . Alcohol use: No    No Known Allergies  Current Outpatient Medications  Medication Sig Dispense Refill  . Prenatal Vit-Fe Fumarate-FA (PRENATAL PO) Take 1 tablet by mouth daily with lunch.    11/10/2009 acetaminophen (TYLENOL) 500 MG tablet Take 2 tablets (1,000 mg total) by mouth every 6 (six) hours as needed for moderate pain or headache. (Patient not taking: Reported on 11/10/2019) 30 tablet 0  . amLODipine (NORVASC) 5 MG tablet Take 1 tablet (5 mg total) by mouth daily. (Patient not taking: Reported on 12/18/2019) 30 tablet 3  . docusate sodium (COLACE) 100 MG capsule Take 1 capsule (100 mg total) by mouth 2 (two) times daily as needed. (Patient not taking: Reported on 11/10/2019) 30  capsule 2  . ibuprofen (ADVIL) 600 MG tablet Take 1 tablet (600 mg total) by mouth every 6 (six) hours as needed. (Patient not taking: Reported on 11/10/2019) 60 tablet 3  . prochlorperazine (COMPAZINE) 10 MG tablet Take 1 tablet (10 mg total) by mouth every 8 (eight) hours as needed for nausea or vomiting (headache). (Patient not taking: Reported on 12/18/2019) 30 tablet 3  . simethicone (GAS-X) 80 MG chewable tablet Chew 1 tablet (80 mg total) by mouth every 6 (six) hours as needed for flatulence. (Patient not taking: Reported on 11/10/2019) 30 tablet 0  . tretinoin (RETIN-A) 0.05 % cream Apply 1 application topically daily as needed (acne).      No current facility-administered medications for this visit.    Review of Systems  Constitutional: Negative for chills, fatigue, fever and unexpected weight change.  HENT: Negative for trouble swallowing.  Eyes: Negative for loss of vision.  Respiratory: Negative for cough, shortness of breath and wheezing.  Cardiovascular: Negative for  chest pain, leg swelling, palpitations and syncope.  GI: Negative for abdominal pain, blood in stool, diarrhea, nausea and vomiting.  GU: Negative for difficulty urinating, dysuria, frequency and hematuria.  Musculoskeletal: Negative for back pain, leg pain and joint pain.  Skin: Negative for rash.  Neurological: Negative for dizziness, headaches, light-headedness, numbness and seizures.  Psychiatric: Negative for behavioral problem, confusion, depressed mood and sleep disturbance.        Objective:  Objective   Vitals:   12/18/19 0900  BP: (!) 162/100  Weight: 232 lb (105.2 kg)   Body mass index is 39.82 kg/m.  Physical Exam Vitals and nursing note reviewed.  Constitutional:      Appearance: She is well-developed.  HENT:     Head: Normocephalic and atraumatic.  Cardiovascular:     Rate and Rhythm: Normal rate and regular rhythm.  Pulmonary:     Effort: Pulmonary effort is normal.     Breath sounds: Normal breath sounds.  Abdominal:     General: Bowel sounds are normal.     Palpations: Abdomen is soft.  Musculoskeletal:        General: Normal range of motion.  Skin:    General: Skin is warm and dry.  Neurological:     Mental Status: She is alert and oriented to person, place, and time.  Psychiatric:        Behavior: Behavior normal.        Thought Content: Thought content normal.        Judgment: Judgment normal.    Bedside TVUS: Intrauterine pregnancy, measuring 7 weeks 4 days, absent fetal heart beat consistent with findings of prior US of demise.       Assessment/Plan:    34 yo with missed abortion at 9 weeks by LMP.  Discussed options for surgical vs medical vs expectant management.  Patient elected for surgical management.  Consented for this procedure. Discussed the risks of bleeding, infections, and damage to pelvic tissues and organs.  Will plan for procedure tomorrow. Consents signed.   More than 25 minutes were spent face to face with the patient in  the room with more than 50% of the time spent providing counseling and discussing the plan of management.   Adrian Prows MD [facility], [facility] 12/18/2019 9:22 AM

## 2019-12-18 NOTE — Telephone Encounter (Signed)
I will order- she will need to pick up from preop- she seemed like she would be okay with this

## 2019-12-18 NOTE — Telephone Encounter (Signed)
No answer , vm is full

## 2019-12-18 NOTE — Telephone Encounter (Signed)
Spoke to the patient but she is in [place] and cannot get to [facility] to pick up the drink before 5pm, and wants to know whether it is something she can pick up at [facility]. Per Dr. Jerene Pitch, probably not, it is a clear Ensure shake, but not sure of the volume and would need to know the exact drink. Left message for Morrie Sheldon at [employer]. Patient is aware I will call back if I hear from [place] before she leaves, and to not get anything unless she hears from me for the exact drink.

## 2019-12-18 NOTE — Telephone Encounter (Signed)
Per Morrie Sheldon at [employer], you cannot buy the specific drink over the counter. Patient is aware.

## 2019-12-18 NOTE — Progress Notes (Signed)
No heartbeat at ER wed C/o some spotting with wiping, no cramping or pain

## 2019-12-19 ENCOUNTER — Ambulatory Visit: Payer: Medicaid Other | Admitting: Anesthesiology

## 2019-12-19 ENCOUNTER — Encounter
Admission: RE | Disposition: A | Discharge status: Home / Self Care | Payer: Self-pay | Source: Home / Self Care | Attending: Obstetrics and Gynecology

## 2019-12-19 ENCOUNTER — Ambulatory Visit
Admission: RE | Admit: 2019-12-19 | Discharge: 2019-12-19 | Disposition: A | Discharge status: Home / Self Care | Payer: Medicaid Other | Attending: Obstetrics and Gynecology | Admitting: Obstetrics and Gynecology

## 2019-12-19 ENCOUNTER — Encounter: Payer: Self-pay | Admitting: Obstetrics and Gynecology

## 2019-12-19 ENCOUNTER — Other Ambulatory Visit: Payer: Self-pay

## 2019-12-19 DIAGNOSIS — K219 Gastro-esophageal reflux disease without esophagitis: Secondary | ICD-10-CM | POA: Insufficient documentation

## 2019-12-19 DIAGNOSIS — Z3A09 9 weeks gestation of pregnancy: Secondary | ICD-10-CM | POA: Diagnosis not present

## 2019-12-19 DIAGNOSIS — Z87891 Personal history of nicotine dependence: Secondary | ICD-10-CM | POA: Insufficient documentation

## 2019-12-19 DIAGNOSIS — Z6839 Body mass index (BMI) 39.0-39.9, adult: Secondary | ICD-10-CM | POA: Diagnosis not present

## 2019-12-19 DIAGNOSIS — O021 Missed abortion: Secondary | ICD-10-CM | POA: Insufficient documentation

## 2019-12-19 DIAGNOSIS — I10 Essential (primary) hypertension: Secondary | ICD-10-CM | POA: Insufficient documentation

## 2019-12-19 DIAGNOSIS — F418 Other specified anxiety disorders: Secondary | ICD-10-CM | POA: Insufficient documentation

## 2019-12-19 DIAGNOSIS — Z79899 Other long term (current) drug therapy: Secondary | ICD-10-CM | POA: Insufficient documentation

## 2019-12-19 HISTORY — PX: DILATION AND EVACUATION: SHX1459

## 2019-12-19 LAB — CBC
HCT: 32.7 % — ABNORMAL LOW (ref 36.0–46.0)
Hemoglobin: 10.6 g/dL — ABNORMAL LOW (ref 12.0–15.0)
MCH: 28.3 pg (ref 26.0–34.0)
MCHC: 32.4 g/dL (ref 30.0–36.0)
MCV: 87.4 fL (ref 80.0–100.0)
Platelets: 295 10*3/uL (ref 150–400)
RBC: 3.74 MIL/uL — ABNORMAL LOW (ref 3.87–5.11)
RDW: 16.1 % — ABNORMAL HIGH (ref 11.5–15.5)
WBC: 8.4 10*3/uL (ref 4.0–10.5)
nRBC: 0 % (ref 0.0–0.2)

## 2019-12-19 LAB — TYPE AND SCREEN
ABO/RH(D): A POS
Antibody Screen: NEGATIVE

## 2019-12-19 SURGERY — DILATION AND EVACUATION, UTERUS
Anesthesia: General | Site: Cervix

## 2019-12-19 MED ORDER — ONDANSETRON HCL 4 MG/2ML IJ SOLN: 4.0000 mg | Freq: Once | INTRAMUSCULAR | Status: DC | PRN

## 2019-12-19 MED ORDER — ONDANSETRON HCL 4 MG/2ML IJ SOLN
INTRAMUSCULAR | Status: DC | PRN
  Administered 2019-12-19: 4 mg via INTRAVENOUS

## 2019-12-19 MED ORDER — FENTANYL CITRATE (PF) 100 MCG/2ML IJ SOLN
INTRAMUSCULAR | Status: AC
  Filled 2019-12-19: qty 2

## 2019-12-19 MED ORDER — LACTATED RINGERS IV SOLN: INTRAVENOUS | Status: DC

## 2019-12-19 MED ORDER — FAMOTIDINE 20 MG PO TABS
ORAL_TABLET | ORAL | Status: AC
  Administered 2019-12-19: 20 mg via ORAL
  Filled 2019-12-19: qty 1

## 2019-12-19 MED ORDER — PROPOFOL 10 MG/ML IV BOLUS
INTRAVENOUS | Status: DC | PRN
  Administered 2019-12-19: 200 mg via INTRAVENOUS

## 2019-12-19 MED ORDER — ACETAMINOPHEN 500 MG PO TABS: 1000.0000 mg | ORAL_TABLET | Freq: Four times a day (QID) | ORAL | 0 refills | Status: DC | PRN

## 2019-12-19 MED ORDER — FENTANYL CITRATE (PF) 100 MCG/2ML IJ SOLN: 25.0000 ug | INTRAMUSCULAR | Status: DC | PRN

## 2019-12-19 MED ORDER — MIDAZOLAM HCL 5 MG/5ML IJ SOLN
INTRAMUSCULAR | Status: DC | PRN
  Administered 2019-12-19: 2 mg via INTRAVENOUS

## 2019-12-19 MED ORDER — IBUPROFEN 600 MG PO TABS: 600.0000 mg | ORAL_TABLET | Freq: Four times a day (QID) | ORAL | 0 refills | Status: DC | PRN

## 2019-12-19 MED ORDER — MIDAZOLAM HCL 2 MG/2ML IJ SOLN
INTRAMUSCULAR | Status: AC
  Filled 2019-12-19: qty 2

## 2019-12-19 MED ORDER — FAMOTIDINE 20 MG PO TABS: 20.0000 mg | ORAL_TABLET | Freq: Once | ORAL | Status: AC

## 2019-12-19 MED ORDER — DEXAMETHASONE SODIUM PHOSPHATE 10 MG/ML IJ SOLN
INTRAMUSCULAR | Status: DC | PRN
  Administered 2019-12-19: 10 mg via INTRAVENOUS

## 2019-12-19 MED ORDER — GLYCOPYRROLATE 0.2 MG/ML IJ SOLN
INTRAMUSCULAR | Status: AC
  Filled 2019-12-19: qty 1

## 2019-12-19 MED ORDER — LIDOCAINE HCL (PF) 2 % IJ SOLN
INTRAMUSCULAR | Status: DC | PRN
  Administered 2019-12-19: 50 mg

## 2019-12-19 MED ORDER — GLYCOPYRROLATE 0.2 MG/ML IJ SOLN
INTRAMUSCULAR | Status: DC | PRN
  Administered 2019-12-19: .2 mg via INTRAVENOUS

## 2019-12-19 MED ORDER — FENTANYL CITRATE (PF) 100 MCG/2ML IJ SOLN
INTRAMUSCULAR | Status: DC | PRN
  Administered 2019-12-19 (×2): 50 ug via INTRAVENOUS

## 2019-12-19 SURGICAL SUPPLY — 23 items
BAG COUNTER SPONGE EZ (MISCELLANEOUS) ×1 IMPLANT
CATH ROBINSON RED A/P 16FR (CATHETERS) ×1 IMPLANT
FILTER UTR ASPR SPEC (MISCELLANEOUS) ×1 IMPLANT
FLTR UTR ASPR SPEC (MISCELLANEOUS) ×2
GAUZE 4X4 16PLY RFD (DISPOSABLE) ×1 IMPLANT
GLOVE BIOGEL PI IND STRL 6.5 (GLOVE) ×2 IMPLANT
GLOVE BIOGEL PI INDICATOR 6.5 (GLOVE) ×2
GLOVE SURG SYN 6.5 ES PF (GLOVE) ×4 IMPLANT
GLOVE SURG SYN 6.5 PF PI (GLOVE) ×2 IMPLANT
GOWN STRL REUS W/ TWL LRG LVL3 (GOWN DISPOSABLE) ×2 IMPLANT
GOWN STRL REUS W/TWL LRG LVL3 (GOWN DISPOSABLE) ×3
KIT BERKELEY 1ST TRIMESTER 3/8 (MISCELLANEOUS) ×1 IMPLANT
KIT TURNOVER CYSTO (KITS) ×2 IMPLANT
NS IRRIG 500ML POUR BTL (IV SOLUTION) ×2 IMPLANT
PACK DNC HYST (MISCELLANEOUS) ×2 IMPLANT
PAD OB MATERNITY 4.3X12.25 (PERSONAL CARE ITEMS) ×2 IMPLANT
PAD PREP 24X41 OB/GYN DISP (PERSONAL CARE ITEMS) ×2 IMPLANT
SET BERKELEY SUCTION TUBING (SUCTIONS) ×2 IMPLANT
TOWEL OR 17X26 4PK STRL BLUE (TOWEL DISPOSABLE) ×2 IMPLANT
VACURETTE 10 RIGID CVD (CANNULA) IMPLANT
VACURETTE 12 RIGID CVD (CANNULA) IMPLANT
VACURETTE 8 RIGID CVD (CANNULA) IMPLANT
VACURETTE 8MM F TIP (MISCELLANEOUS) ×2 IMPLANT

## 2019-12-19 NOTE — Anesthesia Procedure Notes (Signed)
Procedure Name: LMA Insertion Performed by: Walburga Hudman, CRNA Pre-anesthesia Checklist: Patient identified, Patient being monitored, Timeout performed, Emergency Drugs available and Suction available Patient Re-evaluated:Patient Re-evaluated prior to induction Oxygen Delivery Method: Circle system utilized Preoxygenation: Pre-oxygenation with 100% oxygen Induction Type: IV induction Ventilation: Mask ventilation without difficulty LMA: LMA inserted LMA Size: 3.5 Tube type: Oral Number of attempts: 1 Placement Confirmation: positive ETCO2 and breath sounds checked- equal and bilateral Tube secured with: Tape Dental Injury: Teeth and Oropharynx as per pre-operative assessment        

## 2019-12-19 NOTE — Discharge Instructions (Signed)

## 2019-12-19 NOTE — Anesthesia Preprocedure Evaluation (Addendum)
Anesthesia Evaluation  Patient identified by MRN, date of birth, ID band Patient awake    Reviewed: Allergy & Precautions, NPO status , Patient's Chart, lab work & pertinent test results  Airway Mallampati: II  TM Distance: >3 FB Neck ROM: full    Dental  (+) Teeth Intact, Dental Advisory Given Upper/lower braces:   Pulmonary former smoker,    Pulmonary exam normal        Cardiovascular hypertension, Normal cardiovascular exam     Neuro/Psych  Headaches, PSYCHIATRIC DISORDERS Anxiety Depression    GI/Hepatic Neg liver ROS, GERD  Controlled,  Endo/Other  Morbid obesity  Renal/GU negative Renal ROS  Female GU complaint     Musculoskeletal  (+) Arthritis ,   Abdominal   Peds negative pediatric ROS (+)  Hematology  (+) Blood dyscrasia, anemia ,   Anesthesia Other Findings   Reproductive/Obstetrics                            Anesthesia Physical  Anesthesia Plan  ASA: II  Anesthesia Plan: General   Post-op Pain Management:    Induction: Intravenous  PONV Risk Score and Plan:   Airway Management Planned: Oral ETT and LMA  Additional Equipment:   Intra-op Plan:   Post-operative Plan: Extubation in OR  Informed Consent: I have reviewed the patients History and Physical, chart, labs and discussed the procedure including the risks, benefits and alternatives for the proposed anesthesia with the patient or authorized representative who has indicated his/her understanding and acceptance.       Plan Discussed with: CRNA and Surgeon  Anesthesia Plan Comments:        Anesthesia Quick Evaluation

## 2019-12-19 NOTE — Op Note (Signed)
  Operative Note  12/19/2019 2:43 PM  PRE-OP DIAGNOSIS: Missed abortion O02.1   POST-OP DIAGNOSIS: same  SURGEON: Mong Neal MD  ANESTHESIA: Choice   PROCEDURE: Procedure(s): DILATATION AND EVACUATION   ESTIMATED BLOOD LOSS: 200 mL   SPECIMENS: POC   COMPLICATIONS: none  DISPOSITION: PACU - hemodynamically stable.  CONDITION: stable  FINDINGS: Exam under anesthesia revealed a 9 wk size uterus without palpable adnexal masses.   INDICATION FOR PROCEDURE: Missed Abortion  PROCEDURE IN DETAIL: After informed consent was obtained, the patient was taken to the operating room where anesthesia was obtained without difficulty. The patient was positioned in the dorsal lithotomy position with [employer]. Time out was performed and an exam under anesthesia was performed. The vagina, perineum, and lower abdomen were prepped and draped in a normal sterile fashion. The bladder was emptied with an I&O catheter. A speculum was placed into the vagina and the cervix was grasped with a single toothed tenaculum.  The cervix was gently dilated to 16 [place] with  [employer] dilators. The suction was then tested and found to be adequate, and a 8 flexible suction cannula was advanced into the uterine cavity. The suction was activated and the contents of the uterus were aspirated until no further tissue was obtained. The uterus was then curetted to gritty texture throughout.  At the end of the procedure bleeding was noted to be minimal.  All instruments were then removed from the vagina.The patient tolerated the procedure well. All sponge, instrument, and needle counts were correct. The patient was taken to the recovery room in good condition.   Adelene Idler MD [facility], [facility] 12/19/2019 2:43 PM

## 2019-12-19 NOTE — Anesthesia Postprocedure Evaluation (Signed)
Anesthesia Post Note  Patient: Alyssa Pearson  Procedure(s) Performed: DILATATION AND EVACUATION (N/A Cervix)  Patient location during evaluation: PACU Anesthesia Type: General Level of consciousness: awake and alert and oriented Pain management: pain level controlled Vital Signs Assessment: post-procedure vital signs reviewed and stable Respiratory status: spontaneous breathing, nonlabored ventilation and respiratory function stable Cardiovascular status: blood pressure returned to baseline and stable Postop Assessment: no signs of nausea or vomiting Anesthetic complications: no     Last Vitals:  Vitals:   12/19/19 1455 12/19/19 1500  BP: (!) 142/85   Pulse: 83 66  Resp: 15 18  Temp:    SpO2: 100% 100%    Last Pain:  Vitals:   12/19/19 1445  TempSrc:   PainSc: 0-No pain                 Eilis Chestnutt

## 2019-12-19 NOTE — Transfer of Care (Signed)
Immediate Anesthesia Transfer of Care Note  Patient: Alyssa Pearson  Procedure(s) Performed: Procedure(s): DILATATION AND EVACUATION (N/A)  Patient Location: PACU  Anesthesia Type:General  Level of Consciousness: sedated  Airway & Oxygen Therapy: Patient Spontanous Breathing and Patient connected to face mask oxygen  Post-op Assessment: Report given to RN and Post -op Vital signs reviewed and stable  Post vital signs: Reviewed and stable  Last Vitals:  Vitals:   12/19/19 1036 12/19/19 1415  BP: 131/84 (!) 154/97  Pulse: 74 (!) 110  Resp: 16 17  Temp: 36.5 C (!) 36.3 C  SpO2: 99% 100%    Complications: No apparent anesthesia complications

## 2019-12-19 NOTE — Interval H&P Note (Signed)
History and Physical Interval Note:  12/19/2019 12:40 PM  Alyssa Pearson  has presented today for surgery, with the diagnosis of Missed abortion O02.1.  The various methods of treatment have been discussed with the patient and family. After consideration of risks, benefits and other options for treatment, the patient has consented to  Procedure(s): DILATATION AND EVACUATION (N/A) as a surgical intervention.  The patient's history has been reviewed, patient examined, no change in status, stable for surgery.  I have reviewed the patient's chart and labs.  Questions were answered to the patient's satisfaction.     Theophilus Walz R Pascale Maves

## 2019-12-20 ENCOUNTER — Other Ambulatory Visit: Payer: Self-pay | Admitting: Obstetrics and Gynecology

## 2019-12-20 DIAGNOSIS — O021 Missed abortion: Secondary | ICD-10-CM

## 2019-12-20 MED ORDER — OXYCODONE HCL 5 MG PO TABS: 5.0000 mg | ORAL_TABLET | ORAL | 0 refills | Status: DC | PRN

## 2019-12-21 LAB — SURGICAL PATHOLOGY

## 2019-12-24 ENCOUNTER — Emergency Department
Admission: EM | Admit: 2019-12-24 | Discharge: 2019-12-24 | Disposition: A | Discharge status: Home / Self Care | Payer: Medicaid Other | Attending: Emergency Medicine | Admitting: Emergency Medicine

## 2019-12-24 ENCOUNTER — Other Ambulatory Visit: Payer: Self-pay

## 2019-12-24 ENCOUNTER — Encounter: Payer: Self-pay | Admitting: Emergency Medicine

## 2019-12-24 ENCOUNTER — Telehealth: Payer: Medicaid Other | Admitting: Physician Assistant

## 2019-12-24 ENCOUNTER — Emergency Department: Payer: Medicaid Other

## 2019-12-24 DIAGNOSIS — M79662 Pain in left lower leg: Secondary | ICD-10-CM | POA: Diagnosis not present

## 2019-12-24 DIAGNOSIS — Z87891 Personal history of nicotine dependence: Secondary | ICD-10-CM | POA: Diagnosis not present

## 2019-12-24 DIAGNOSIS — I10 Essential (primary) hypertension: Secondary | ICD-10-CM | POA: Diagnosis not present

## 2019-12-24 DIAGNOSIS — M79605 Pain in left leg: Secondary | ICD-10-CM

## 2019-12-24 DIAGNOSIS — N3090 Cystitis, unspecified without hematuria: Secondary | ICD-10-CM | POA: Diagnosis not present

## 2019-12-24 DIAGNOSIS — R200 Anesthesia of skin: Secondary | ICD-10-CM

## 2019-12-24 DIAGNOSIS — Z79899 Other long term (current) drug therapy: Secondary | ICD-10-CM | POA: Diagnosis not present

## 2019-12-24 DIAGNOSIS — M545 Low back pain: Secondary | ICD-10-CM | POA: Diagnosis present

## 2019-12-24 DIAGNOSIS — M5442 Lumbago with sciatica, left side: Secondary | ICD-10-CM

## 2019-12-24 LAB — COMPREHENSIVE METABOLIC PANEL
ALT: 14 U/L (ref 0–44)
AST: 21 U/L (ref 15–41)
Albumin: 3.8 g/dL (ref 3.5–5.0)
Alkaline Phosphatase: 65 U/L (ref 38–126)
Anion gap: 12 (ref 5–15)
BUN: 10 mg/dL (ref 6–20)
CO2: 24 mmol/L (ref 22–32)
Calcium: 9.2 mg/dL (ref 8.9–10.3)
Chloride: 101 mmol/L (ref 98–111)
Creatinine, Ser: 0.77 mg/dL (ref 0.44–1.00)
GFR calc Af Amer: >60 mL/min (ref >60)
GFR calc non Af Amer: >60 mL/min (ref >60)
Glucose, Bld: 94 mg/dL (ref 70–99)
Potassium: 4.4 mmol/L (ref 3.5–5.1)
Sodium: 137 mmol/L (ref 135–145)
Total Bilirubin: 0.5 mg/dL (ref 0.3–1.2)
Total Protein: 7.8 g/dL (ref 6.5–8.1)

## 2019-12-24 LAB — CBC WITH DIFFERENTIAL/PLATELET
Abs Immature Granulocytes: 0.06 10*3/uL (ref 0.00–0.07)
Basophils Absolute: 0 10*3/uL (ref 0.0–0.1)
Basophils Relative: 0 %
Eosinophils Absolute: 0.1 10*3/uL (ref 0.0–0.5)
Eosinophils Relative: 1 %
HCT: 33.1 % — ABNORMAL LOW (ref 36.0–46.0)
Hemoglobin: 10.9 g/dL — ABNORMAL LOW (ref 12.0–15.0)
Immature Granulocytes: 1 %
Lymphocytes Relative: 29 %
Lymphs Abs: 3 10*3/uL (ref 0.7–4.0)
MCH: 28.4 pg (ref 26.0–34.0)
MCHC: 32.9 g/dL (ref 30.0–36.0)
MCV: 86.2 fL (ref 80.0–100.0)
Monocytes Absolute: 0.4 10*3/uL (ref 0.1–1.0)
Monocytes Relative: 4 %
Neutro Abs: 6.6 10*3/uL (ref 1.7–7.7)
Neutrophils Relative %: 65 %
Platelets: 331 10*3/uL (ref 150–400)
RBC: 3.84 MIL/uL — ABNORMAL LOW (ref 3.87–5.11)
RDW: 16 % — ABNORMAL HIGH (ref 11.5–15.5)
WBC: 10.2 10*3/uL (ref 4.0–10.5)
nRBC: 0 % (ref 0.0–0.2)

## 2019-12-24 LAB — APTT: aPTT: 30 seconds (ref 24–36)

## 2019-12-24 LAB — URINALYSIS, COMPLETE (UACMP) WITH MICROSCOPIC
Bilirubin Urine: NEGATIVE
Glucose, UA: NEGATIVE mg/dL
Ketones, ur: NEGATIVE mg/dL
Nitrite: NEGATIVE
Protein, ur: 100 mg/dL — AB
Specific Gravity, Urine: 1.025 (ref 1.005–1.030)
Squamous Epithelial / HPF: >50 — ABNORMAL HIGH (ref 0–5)
pH: 6 (ref 5.0–8.0)

## 2019-12-24 LAB — PROTIME-INR
INR: 0.9 (ref 0.8–1.2)
Prothrombin Time: 12.1 seconds (ref 11.4–15.2)

## 2019-12-24 MED ORDER — TRAMADOL HCL 50 MG PO TABS: 50.0000 mg | ORAL_TABLET | Freq: Four times a day (QID) | ORAL | 0 refills | Status: AC | PRN

## 2019-12-24 MED ORDER — CEPHALEXIN 500 MG PO CAPS: 500.0000 mg | ORAL_CAPSULE | Freq: Three times a day (TID) | ORAL | 0 refills | Status: AC

## 2019-12-24 NOTE — Progress Notes (Signed)
Based on what you shared with me, I feel your condition warrants further evaluation and I recommend that you be seen for a face to face office visit. Giving note of numbness and/or weakness along with the pain, you need a detailed examination to make sure no imaging is warranted (numbness can be sign of nerve impingement). They can also give injection to help calm down these symptoms more rapidly.     NOTE: If you entered your credit card information for this eVisit, you will not be charged. You may see a "hold" on your card for the $35 but that hold will drop off and you will not have a charge processed.   If you are having a true medical emergency please call 911.      For an urgent face to face visit,  has five urgent care centers for your convenience:      NEW:  [facility] at [facility] Directions [phone] [address] [place], [place] [address] . 10 am - 6pm Monday - Friday    [facility]) Get Driving Directions [phone] [address] [place], [place] [address] . 10 am to 8 pm Monday-Friday . 12 pm to 8 pm [facility] Urgent Care at [facility] Get Driving Directions [phone] [address] [place], [place] [address] . 8 am to 8 pm Monday-Friday . 9 am to 6 pm Saturday . 11 am to 6 pm Sunday     [facility] at [facility] Get Driving Directions  [phone] [address] [place], [place] [address] . 8 am to 8 pm Monday-Friday . 8 am to 4 pm  [facility] Care at [facility] Directions [phone] [address] [place], [place] [address] . 12 pm to 6 pm Monday-Friday      Your e-visit answers were reviewed by a board certified advanced clinical practitioner to complete your personal care plan.  Thank you for using e-Visits.

## 2019-12-24 NOTE — ED Notes (Signed)
HSB pad not working in room for pt to sign for DC. Pt understood DC instructions and did not have any questions regarding prescriptions or follow up care.

## 2019-12-24 NOTE — ED Provider Notes (Signed)
Emergency Department Provider Note  ____________________________________________  Time seen: Approximately 10:37 PM  I have reviewed the triage vital signs and the nursing notes.   HISTORY  Chief Complaint Leg Pain   Historian Patient    HPI Alyssa Pearson is a 34 y.o. female presents to the emergency department with low back pain that seems to radiate down the posterior aspect of the left leg.  Patient reports that back pain started approximately one week after patient had a D&C.  She denies dysuria, hematuria or increased urinary frequency.  No nausea or vomiting.  Patient denies a history of nephrolithiasis.  She denies taking alleviating medications at home for her symptoms.  No fever.  She reports that she has a follow-up appointment with her OB/GYN this week.   Past Medical History:  Diagnosis Date  . Anemia   . Anxiety    h/o   . Arthritis    bil feet  . Depression    h/o  . GERD (gastroesophageal reflux disease)    h/o  . Headache    migraines  . Hypertension      Immunizations up to date:  Yes.     Past Medical History:  Diagnosis Date  . Anemia   . Anxiety    h/o   . Arthritis    bil feet  . Depression    h/o  . GERD (gastroesophageal reflux disease)    h/o  . Headache    migraines  . Hypertension     Patient Active Problem List   Diagnosis Date Noted  . History of reversal of tubal ligation 09/17/2017    Past Surgical History:  Procedure Laterality Date  . CHROMOPERTUBATION N/A 09/14/2019   Procedure: CHROMOPERTUBATION;  Surgeon: Natale Milch, MD;  Location: [facility];  Service: Gynecology;  Laterality: N/A;  . DILATION AND EVACUATION N/A 12/19/2019   Procedure: DILATATION AND EVACUATION;  Surgeon: Natale Milch, MD;  Location: [facility];  Service: Gynecology;  Laterality: N/A;  . HYSTEROSCOPY WITH D & C N/A 09/14/2019   Procedure: DILATATION AND CURETTAGE /HYSTEROSCOPY, POLYPECTOMY;  Surgeon: Natale Milch,  MD;  Location: [facility];  Service: Gynecology;  Laterality: N/A;  . LAPAROSCOPY N/A 09/14/2019   Procedure: LAPAROSCOPY DIAGNOSTIC;  Surgeon: Natale Milch, MD;  Location: [facility];  Service: Gynecology;  Laterality: N/A;  . TUBAL LIGATION    . Tubal reversal  08/31/2017  . WISDOM TOOTH EXTRACTION      Prior to Admission medications   Medication Sig Start Date End Date Taking? Authorizing Provider  acetaminophen (TYLENOL) 500 MG tablet Take 2 tablets (1,000 mg total) by mouth every 6 (six) hours as needed for moderate pain or headache. 12/19/19   Schuman, Christanna R, MD  amLODipine (NORVASC) 5 MG tablet Take 1 tablet (5 mg total) by mouth daily. 08/24/19   Schuman, Jaquelyn Bitter, MD  ascorbic acid (VITAMIN C) 500 MG tablet Take 500 mg by mouth daily.    [provider]  cephALEXin (KEFLEX) 500 MG capsule Take 1 capsule (500 mg total) by mouth 3 (three) times daily for 7 days. 12/24/19 12/31/19  Orvil Feil, PA-C  ibuprofen (ADVIL) 600 MG tablet Take 1 tablet (600 mg total) by mouth every 6 (six) hours as needed. 12/19/19   Schuman, Jaquelyn Bitter, MD  oxyCODONE (ROXICODONE) 5 MG immediate release tablet Take 1 tablet (5 mg total) by mouth every 4 (four) hours as needed for severe pain. 12/20/19   Natale Milch, MD  Prenatal Vit-Fe Fumarate-FA (PRENATAL PO) Take 1 tablet by mouth daily.     [provider]  prochlorperazine (COMPAZINE) 10 MG tablet Take 1 tablet (10 mg total) by mouth every 8 (eight) hours as needed for nausea or vomiting (headache). Patient not taking: Reported on 12/18/2019 11/23/19   Conard Novak, MD  simethicone (GAS-X) 80 MG chewable tablet Chew 1 tablet (80 mg total) by mouth every 6 (six) hours as needed for flatulence. Patient not taking: Reported on 11/10/2019 09/21/19   Natale Milch, MD  traMADol (ULTRAM) 50 MG tablet Take 1 tablet (50 mg total) by mouth every 6 (six) hours as needed for up to 3 days. 12/24/19 12/27/19  Orvil Feil, PA-C    Allergies Patient has no known allergies.  No family history on file.  Social History Social History   Tobacco Use  . Smoking status: Former Smoker    Packs/day: 0.50    Years: 7.00    Pack years: 3.50    Types: Cigarettes    Quit date: 09/10/2009    Years since quitting: 10.2  . Smokeless tobacco: Never Used  Substance Use Topics  . Alcohol use: No  . Drug use: No     Review of Systems  Constitutional: No fever/chills Eyes:  No discharge ENT: No upper respiratory complaints. Respiratory: no cough. No SOB/ use of accessory muscles to breath Gastrointestinal:   No nausea, no vomiting.  No diarrhea.  No constipation. Musculoskeletal: Patient has left leg pain. Patient has low back pain.  Skin: Negative for rash, abrasions, lacerations, ecchymosis.   ____________________________________________   PHYSICAL EXAM:  VITAL SIGNS: ED Triage Vitals  Enc Vitals Group     BP 12/24/19 1752 (!) 146/83     Pulse Rate 12/24/19 1752 81     Resp 12/24/19 1752 18     Temp 12/24/19 1752 98.8 F (37.1 C)     Temp Source 12/24/19 1752 Oral     SpO2 12/24/19 1752 97 %     Weight 12/24/19 1750 232 lb (105.2 kg)     Height 12/24/19 1750 5\' 4"  (1.626 m)     Head Circumference --      Peak Flow --      Pain Score 12/24/19 1750 7     Pain Loc --      Pain Edu? --      Excl. in GC? --      Constitutional: Alert and oriented. Well appearing and in no acute distress. Eyes: Conjunctivae are normal. PERRL. EOMI. Head: Atraumatic. ENT: Cardiovascular: Normal rate, regular rhythm. Normal S1 and S2.  Good peripheral circulation. Respiratory: Normal respiratory effort without tachypnea or retractions. Lungs CTAB. Good air entry to the bases with no decreased or absent breath sounds Gastrointestinal: Bowel sounds x 4 quadrants. Soft and nontender to palpation. No guarding or rigidity. No distention. Musculoskeletal: Full range of motion to all extremities. No obvious  deformities noted. Negative straight leg raise test bilaterally.  No edema or erythema of the left lower extremity. Neurologic:  Normal for age. No gross focal neurologic deficits are appreciated.  Skin:  Skin is warm, dry and intact. No rash noted. Psychiatric: Mood and affect are normal for age. Speech and behavior are normal.   ____________________________________________   LABS (all labs ordered are listed, but only abnormal results are displayed)  Labs Reviewed  CBC WITH DIFFERENTIAL/PLATELET - Abnormal; Notable for the following components:      Result Value   RBC  3.84 (*)    Hemoglobin 10.9 (*)    HCT 33.1 (*)    RDW 16.0 (*)    All other components within normal limits  URINALYSIS, COMPLETE (UACMP) WITH MICROSCOPIC - Abnormal; Notable for the following components:   Color, Urine YELLOW (*)    APPearance CLOUDY (*)    Hgb urine dipstick LARGE (*)    Protein, ur 100 (*)    Leukocytes,Ua MODERATE (*)    Bacteria, UA FEW (*)    Squamous Epithelial / LPF >50 (*)    Non Squamous Epithelial PRESENT (*)    All other components within normal limits  COMPREHENSIVE METABOLIC PANEL  PROTIME-INR  APTT   ____________________________________________  EKG   ____________________________________________  RADIOLOGY Geraldo Pitter, personally viewed and evaluated these images (plain radiographs) as part of my medical decision making, as well as reviewing the written report by the radiologist.  US Venous Img Lower Unilateral Left  Result Date: 12/24/2019 CLINICAL DATA:  Left lower extremity pain after surgery. EXAM: LEFT LOWER EXTREMITY VENOUS DOPPLER ULTRASOUND TECHNIQUE: Gray-scale sonography with graded compression, as well as color Doppler and duplex ultrasound were performed to evaluate the lower extremity deep venous systems from the level of the common femoral vein and including the common femoral, femoral, profunda femoral, popliteal and calf veins including the posterior  tibial, peroneal and gastrocnemius veins when visible. The superficial great saphenous vein was also interrogated. Spectral Doppler was utilized to evaluate flow at rest and with distal augmentation maneuvers in the common femoral, femoral and popliteal veins. COMPARISON:  None. FINDINGS: Contralateral Common Femoral Vein: Respiratory phasicity is normal and symmetric with the symptomatic side. No evidence of thrombus. Normal compressibility. Common Femoral Vein: No evidence of thrombus. Normal compressibility, respiratory phasicity and response to augmentation. Saphenofemoral Junction: No evidence of thrombus. Normal compressibility and flow on color Doppler imaging. Profunda Femoral Vein: No evidence of thrombus. Normal compressibility and flow on color Doppler imaging. Femoral Vein: No evidence of thrombus. Normal compressibility, respiratory phasicity and response to augmentation. Popliteal Vein: No evidence of thrombus. Normal compressibility, respiratory phasicity and response to augmentation. Calf Veins: No evidence of thrombus. Normal compressibility and flow on color Doppler imaging. Superficial Great Saphenous Vein: No evidence of thrombus. Normal compressibility. Other Findings:  None. IMPRESSION: No evidence of deep venous thrombosis. Electronically Signed   By: Katherine Mantle M.D.   On: 12/24/2019 20:09    ____________________________________________    PROCEDURES  Procedure(s) performed:     Procedures     Medications - No data to display   ____________________________________________   INITIAL IMPRESSION / ASSESSMENT AND PLAN / ED COURSE  Pertinent labs & imaging results that were available during my care of the patient were reviewed by me and considered in my medical decision making (see chart for details).      Assessment and plan Cystitis Left leg pain 34 year old female presents to the emergency department with left-sided low back and left leg pain since having  a D&C type procedure approximately 1 week ago.  On physical exam, patient was resting comfortably.  She had no suprapubic tenderness to palpation or CVA tenderness.  She had a negative straight leg raise test.  There was no appreciable erythema or edema of the left lower extremity.  Differential diagnosis included low back pain with left lower extremity radiculopathy, cystitis, DVT...  Urinalysis did reveal a moderate amount of leuks and a large amount of blood which increase suspicion for early cystitis.  CMP was reassuring.  PT and APTT were within reference range.  No leukocytosis on CBC.  Venous ultrasound of the left lower extremity was noncontributory for thrombus formation.  Patient will be treated with Keflex and tramadol for cystitis and low back pain at this time.  Patient has a follow-up appointment with her OB/GYN this week.  Return precautions were given to return with new or worsening symptoms.  All patient questions were answered.  ____________________________________________  FINAL CLINICAL IMPRESSION(S) / ED DIAGNOSES  Final diagnoses:  Cystitis  Pain of left lower extremity      NEW MEDICATIONS STARTED DURING THIS VISIT:  ED Discharge Orders         Ordered    cephALEXin (KEFLEX) 500 MG capsule  3 times daily     12/24/19 2123    traMADol (ULTRAM) 50 MG tablet  Every 6 hours PRN     12/24/19 2123              This chart was dictated using voice recognition software/Dragon. Despite best efforts to proofread, errors can occur which can change the meaning. Any change was purely unintentional.     Lannie Fields, PA-C 12/24/19 2308    Arta Silence, MD 12/24/19 2333

## 2019-12-24 NOTE — ED Triage Notes (Signed)
Pt arrived via POV with reports of left leg pain that started 2 days ago, pt states the pain shoots up to left hip, pt c/o increased pain when walking.

## 2019-12-28 ENCOUNTER — Ambulatory Visit (INDEPENDENT_AMBULATORY_CARE_PROVIDER_SITE_OTHER): Payer: Medicaid Other | Admitting: Obstetrics and Gynecology

## 2019-12-28 ENCOUNTER — Other Ambulatory Visit: Payer: Self-pay

## 2019-12-28 ENCOUNTER — Encounter: Payer: Self-pay | Admitting: Obstetrics and Gynecology

## 2019-12-28 VITALS — BP 148/98 | Ht 64.0 in | Wt 231.0 lb

## 2019-12-28 DIAGNOSIS — O021 Missed abortion: Secondary | ICD-10-CM

## 2019-12-28 DIAGNOSIS — F322 Major depressive disorder, single episode, severe without psychotic features: Secondary | ICD-10-CM

## 2019-12-28 DIAGNOSIS — Z9889 Other specified postprocedural states: Secondary | ICD-10-CM

## 2019-12-28 MED ORDER — ESCITALOPRAM OXALATE 10 MG PO TABS: 10.0000 mg | ORAL_TABLET | Freq: Every day | ORAL | 3 refills | Status: DC

## 2019-12-28 NOTE — Patient Instructions (Signed)
[facility]  MJ Tucci [phone]   Please accept our heartfelt condolences and call us if you need an ear to listen.  Web Resources: www.babycenter.com www.acog.org [email or web address] [email or web address] www.[email or web address] This site has an excellent pamphlet that you can download on early pregnancy loss.  Books: Miscarriage: Women Sharing from the Heart by Marie Allen and Shelly Marks. Published by [employer] and [employer]. Summarizes reactions of 100 women who experience miscarriage.  Molly's [place] by Janice Cohn. Published by [employer]. Children's book on miscarriage.  Miscarriage: A Man's Book by Rick Wheat. Published by [employer] at [phone]. Written to help men understand reactions of both parents to miscarriage.  Miscarriage: A Shattered Dream by Sherokee Ilse and Linda Hammer Burns.  I Never Held You: A book about miscarriage, healing and recovery by Ellen M. DuBois.   MISCARRIAGE OR NEONATAL LOSS.a word to parents By Gretchen Gross, MSW  For those who experience a miscarriage or fetal loss, the world has turned upside down. Regardless of whether the pregnancy was planned and wanted, or unplanned and the parents were ambivalent, there are feelings of sadness, grief and a loss of equilibrium which effects both parents. Though we allow for these feelings when other deaths occur, we are remiss in acknowledging and allowing parents to grieve their pregnancy loss or miscarriage. We wrongly equate the depth of the loss to the size of the casket.  With pregnancy loss or death before birth, there is little guidance or expectation of what is normal grief. Others, who minimize or misunderstand the loss, may expect that you "get back to normal" quickly. With pressures to overlook the impact of the loss, healthy grieving processes can get stunted, overlooked, or stopped all together. I hope that this handout will  allow you to acknowledge and express whatever loss you feel, to share with others your sorrow and will encourage you and others to take a moment to understand and experience the depth of your emotions at this time.  A baby represents so much. It may represent hope, immortality, fulfillment of dreams, or an outward sign of a loving relationship. At the same time, pregnancy may bring on anxiety, fear, and an increase in commitment and responsibility. Understandably, few pregnant parents experience only one or the other of these emotions. Despite the type of emotional response to the pregnancy, as the process continues, what is central to almost all pregnancies is that over time, the bond between parent and child grows. Each day brings an expansion of the world. What was once routine now becomes more important. Diet, lifestyle, connection to family, time commitments, relationships with friends, spouse, others.nothing is as it was. These are the normal changes that a pregnant woman and her partner experience. When a baby dies, nothing that made sense before makes any sense after. We are left emotionally raw. Often our bodies respond in ways that assure us that we are crazy, but we are not. We are grieving a life that we never fully knew, a relationship that was too short. A part of us has died Too.  Mothers and fathers become more attached to a pregnancy as each day, week and month go by. Mothers have physical contact with the pregnancy, daily reminders of the growth and change occurring. They connect by talking to their bellies, by wondering what life will be like this time next year, look for clothes, cribs, pediatricians and more. Fathers often become attached in different ways: considering buying a new car or safe car   seat, working more to earn a little more money before the baby arrives, or by reconsidering the family's financial status. However bonding occurs, it generally increases as  the baby grows. For family members, co-workers and friends, the pregnancy may be less real. They may only know of the pregnancy from a physical or emotional distance. Since many of us are so uncomfortable with death and we work hard to deny it. With a prenatal loss, the lack of contact with the unborn baby may encourage others to minimize the loss, or even suggest that it was something to feel thankful for at this time suggesting that the pain felt now would be significantly less than at any other time in relationship to this child.  That is why it feels so crazy to be in such pain when others might not understand. You might feel that you are still standing at an emotional graveside and the world wonders "when you'll be ready to go back to work" or "why you still cry. It's been four months already." Be assured that it is the rest of the world that is, in this case, acting crazy. When a living child or adult dies, we have understandings, behaviors, rituals that though painful, enable us to feel cared for, comforted, acknowledged in our loss, and encourage healing. Unfortunately, too little of this is the case when there is a miscarriage or neonatal loss.  There are many difficult dilemmas that you might face in the wake of the loss. Do you have a funeral? How do you tell people? Do you publish an obituary? What do you do with the remains, both physical and emotional? I encourage people to find a formalized way to acknowledge the loss of the pregnancy and to honor the relationship that did and does still exist and will for the rest of your life. It is your choice as to whether this happens in a funeral service, in a memorial service, in a letter to the child who you did not meet or in some manner that makes sense to you religiously, spiritually or emotionally. It is not silly to ask friends or family to participate nor is it improper to keep this intensely personal and private. Grief, however,  needs to be a public process. We must grieve among others and with our loved ones. We do not all need to feel the same amount of pain in the loss, but we need to act as a loving community to support those most hurt at this time. Healthy grieving involves a strengthening of the bonds with others who we love and who love us. Some people ask a close friend or relative to contact others and inform them of the loss to minimize the telling of the story. Others find relief in telling friends, family and colleagues about the loss, in that it reminds us that the pregnancy was real and that this pain is real. Where one connection is broken, another is reinforced and strengthened. Grieving in isolation can be detrimental. By inviting important others to a funeral, tree planting, or sunset service and by taking them up on an offer to help will help your healing.  Some people have described aspects of grief after a pregnancy loss that are quite different from those present after the loss of an adult relative or friend. Some things that might seem "weird" or "abnormal" are indeed, quite normal following this kind of loss. G.W. Davidson described her experience in the book "Understanding: Death of the Wished-for Child." "  I kept searching for something. I wasn't sure what it was. All of a sudden one day in the kitchen, I spontaneously got out my kitchen scales and started weighing fruits and vegetables. I realized I was trying to find something that had the identical weight that the baby did.I found myself weighing my rolling pin.it happened to be the identical length and weight that the baby was." This describes the process of building memories. With miscarriage and neonatal loss, there are too few memories of the pregnancy or the baby itself. Parents may not ever be able to see or hold a baby in these cases, especially in the earlier trimesters, so saving mementos and gathering and providing information  are especially important. Minta Balsam described a parent who needs to see and feel something that had similar dimensions as the child. This is profoundly different than the grieving that happens when a 36 year old person dies, when we have years of memories, concrete possessions, photos and history. Though it is a different aspect of grief, it is normal, expected and healthy. Your grief will not make you crazy.  Also, do not be frightened if you feel this loss in a very physical manner. Your arms or chest may ache, convincing you that your heart has broken. Women have described this as a feeling of "empty arms". They also describe a similar feeling of an emptiness in their bellies, which was previously full. Some women also swear that they still feel the baby moving in their bellies. Many people report being woken to the sound of a crying baby, having vivid dreams about babies, trying to find their lost babies, or graphic dreams of injuries happening to themselves or others. Breasts may still feel as if they are filling and it may seem as if the body is unaware of the loss. These things can feel quite confusing. If you experience any of these responses, please tell someone about them. They are not a sign of insanity, they are aspects of grief. Keeping them to yourself increases the feelings of isolation and disequilibrium. Be reassured that these are normal experiences after the loss of a pregnancy.   A WORD ABOUT DIFFERENCES BETWEEN MEN'S AND WOMEN'S GRIEF  If most people do not fully understand the pain of a mother's grief through pregnancy loss, there is an even greater denial on society's part that the father is in pain. I have seen men in great pain and despair after the loss of a pregnancy and they say that nobody has ever asked them about their loss. People may only ask about their wife/partner's pain. To put it simply, one father, head bowed, shoulders heaving as he cried said  "if one more person asks me how Opal Sidles is, I'll fly apart. Don't they know I lost a child too? Don't they see the circles under my eyes, my face, my pain? Don't I count?" Couples often move through the grief process in different ways and at different times. Do not assume that your partner is better because they are wanting to take in a movie or go out to eat. This may just be a diversion, a need for a change of scenery, and a yearning for normalcy. Women are usually more comfortable crying and men find safety in action. For this reason, friction can develop between partners, when one wants the other to start behaving like they did before the loss or to cry along with them. Respect one another's process. Talk to your partner about  the specifics. If asked, "How are you today?" many people will just say "fine". Specifically relate that you have had a hard time with the holiday, or seeing a friend's new baby, and ask your partner how they felt in that moment. Sex is very often a very difficult prospect after a miscarriage or loss. It might remind a person of the conception, raise fears of another pregnancy and future children, or seem like too much pleasure to experience when one is otherwise in pain. Many men reconnect with their partner by being sexual and feel increasingly isolated when a partner does not respond. Talk. There may be a comfortable middle ground with each other. If the general patterns of the relationship shift toward uncomfortable dynamics, such as decreased communication, blaming one another, or increased absences from the home, it is important to seek counseling with a professional. This is not an easy time, you have had a loss. Be patient with yourself. Seek help and support from loved ones, friends, professionals. The grief will evolve with time and you will not always be in such immediate or raw pain. Be assured that you may never forget your pregnancy or your baby. Healing  takes time. I have compiled a list of books on grief and death that might be useful.  *Anna: A Daughter's Life. Eartha Inch, [place], 1993. A father's account of his grief at the loss of his infant daughter. *Explaining Death to Children. Claudie Leach, [place]. A valuable guide to help adults/parents explain death to children. *When Pregnancy Fails: Families Coping with Miscarriage, Ectopic Pregnancy, Stillbirth, and Infant Death. Erline Levine and Rocco Serene, [employer], 1989. A good resource to understand pregnancy loss, including some information on support for the family. *Hour of Gold, Hour of Lead: Diaries and Letters of Burt Knack. [place]. Danne Harbor writes of the pain and loss in the wake of their young child's abduction. *A Child Dies: A Portrait of Family Grief. Rande Lawman and Penelope [address] [place], [place]. Dealing with death of unborn children, infants, and children of all ages. Revised by Candiss Norse. Fredirick Maudlin, MS November, 2008    [place] WITH A MISCARRIAGE Sheppard Plumber, R.N., M.A.T.  You have had a miscarriage. You have lost a pregnancy. This is undoubtedly a difficult and sad time for you. Your loss may be hard to believe and it is frustrating to know you are helpless to change anything. "This can't possibly be happening to me. Why me? Why not someone else" I promise I'll be more careful if I can only have my baby back." These are common reactions to experiencing a miscarriage. Feelings of disbelief and anger, helplessness and guilt, depression and perhaps failure, are real and understandable. You may tell yourself that the guilt you feel is irrational, unreasonable or inappropriate, but it is there. "What did I do to cause this? Why wasn't I more careful? If only I had known. Is this a punishment for something I did?" You may find yourself recounting the days or weeks  before your miscarriage, searching for clues that you feel sure you must have missed; searching for valid reasons for how or why this happened. Having something "taken away" that you cherish feels shocking and unbelievable. You ask your doctor or midwife for an explanation; friends volunteer their opinions. In many cases, your questions of "how" or "why" are not satisfactorily answered. You may have some of the above thoughts or feelings and they  may be present in different combinations, differing degrees, and in some confusion. They are understandable and part of the process of beginning to cope with a significant loss. With any death, it is healthy and essential to allow yourself to experience grief whether you are a man or woman. The grieving process is not something begun and completed in a day, a week or a month. It takes time, understanding, support and acceptance. In the case of a miscarriage, this process is often more difficult because the death is not publicly acknowledged. There is no funeral to arrange and attend, no outward recognition of a loss. This may tend to increase feelings of isolation and loneliness. The term miscarriage is used here instead of the more medically correct term spontaneous abortion to avoid confusion with elective abortion. Some couples feel they must keep their grief invisible and "get over this quickly." Most employers will freely give time off to attend a family funeral, but many do not understand the reasonable and justified request for time off after a miscarriage. Loss of a longed-for pregnancy, regardless of how early, is a significant bereavement. Men and women may react to the miscarriage differently. Men may feel more helpless and frustrated than their wives because they often assume a passive-observer role. Waiting and watching can be harder than actively participating, regardless of the outcome. Men do experience a miscarriage in spite of not  actively participating in the physical loss. People have different ways of coping with a loss and you need to choose what is most helpful to you. Too often "being strong" is looked upon as a healthy way of coping, when, in fact, repressing and ignoring very real feelings is not helpful and contributes to serious coping problems. Some feelings and questions that men and women express and ask are:  It hangs over me like a black cloud. Though I've tried, I honestly don't know how to work this through. How do I say goodbye?  I feel extremely guilty. I guess I really didn't want this pregnancy. I feel relieved that it's over, but so guilty because I'm relieved. This isn't right.  I'm fine. Life must go on and I'm a strong person. There's really nothing to grieve for anyway, is there?  I feel pressure not to be sad. So I tend to cover up a lot.  How do I respond to well meaning but frustrating platitudes like, "Try again soon, dear. Another pregnancy will help." I would like to be pregnant again, but I believe a pregnancy should happen at a time of strength, not sadness.  How do I respond to silence and awkward attempts to avoid the whole issue? (You may be the one that decides to bring up the topic. Friends, relatives and acquaintances often do not know how to respond, so they say nothing).  I'm fine until my period comes. It is such a start reminder of the part of me that is lost.  My husband/wife and I always enjoyed a good sex life. Since my miscarriage, I have a hard time allowing myself to feel loving and sensuous. After all if such a loving and pleasurable experience as intercourse started something that ended in such a tragedy, it's too scary to think it might happen again. Why doesn't my husband/wife understand?  I have to face reality. Should I just force myself to attend my friend's baby shower?  Everyone was so supportive and caring when I was in the hospital. That was four months  ago.   I can't burden others with my sadness now. Why am I still sad? I should be over this thing by now. Knowing common facts about miscarriage probably will not blot out your negative feelings - denial, anger, disappointment, sadness - but facts can hold some comfort and can help cushion the sadness and guilt. For those wanting a child, it is frustrating and intensely disappointing to experience any miscarriage. However, most pregnancies that end in the first three months are imperfect in some way and, regardless of any precaution or intervention, are incapable of surviving and growing beyond approximately 14 weeks. This fact may not ease your sense of emptiness and sadness, but it may help ease your guilt and sense of assumed responsibility. Knowing clearly that you did not cause this to happen, that you are not directly responsible of your miscarriage by what you did or did not do, can bring a sense of relief and relaxation to your anxieties or guilt. A pregnancy that ends in later months is uniquely difficult also. Your protruding abdomen, your child's movements and heartbeat, are concrete proof of your expectant parenthood. A miscarriage occurring at 6 weeks or at 6 months causes a sudden change of events and feelings. Understandably, you may feel robbed or cheated of a promise of what was to be. It's easy to think that everyone else can have children. Surely I've been tricked or badly treated, you think. A fact not commonly know is that at least one in five pregnancies end in miscarriage, which indicates you are not alone. In a room of 25 couples, the odds are that 4 have experienced a miscarriage. Of these 8 people, many have shared your experience and feelings. Of course, this is not an easy topic to talk about, therefore it can appear that miscarriages rarely happen and that you are the only one coping with this loss. Realizing you are note alone can perhaps ease some of the shock and  disbelief of "WHY Me?"  Three actions will help: 1. Giver yourself permission to be sad. It is possible to be sad without becoming chronically depressed. Set limits for yourself if you are concerned about becoming overly depressed. If the sadness begins to wash over you while at work at 2 p.m., tell yourself that you cannot deal with it then, but make an agreement with yourself that at 8 p.m. that night you will. And then do it. Many individuals report a beginning sense of control over their grief when they realize they can create appropriate and private times to be sad, alone, or with a significant other person. If you think you may be severely depressed (i.e. having difficulty getting out of bed in the morning, withdrawal from friends and relatives, insomnia, extended loss of appetite or overeating, loss of the ability to enjoy anything), this problem is probably not something you can handle yourself. It is important to seek out professional help at this time, or at any time when you are feeling particularly discouraged about your Coping.  2. Find a supportive, trusted person and talk about your feelings and your questions. Build a support system, whether it be your spouse, partner, friend, relative, colleague, clergyman, support group or professional counselor. You may still feel some pangs of sadness when attending a baby shower, celebrating a holiday, seeing other pregnant couples or passing the date of your expected delivery, but once you work through your loss, coping with your situation will become easier.  3. Also part of the process of life   itself, is understanding and accepting incongruent or seemingly contradictory feelings. As one insightful women expressed, "Being happy for a friend who has just had a baby (or twins!) is a genuine feeling, but also resentment, anger and frustration are there, all wrapped into one package;" two apparently conflicting feelings, but both can  exist, be recognized, and accepted.  A miscarriage is an unexpected, often unexplained, death; with any loss you need to give yourself permission to grieve, to be angry, sad or relieved. There is no one appropriate reaction. Acknowledging your feelings that are real and understandable, though they may not be logical or rational, is part of the grieving process. Allowing the grieving process to happen is a positive way of coping and leads to health resolve and strength to look ahead. There may be times when you may not be able to put words to your feelings, but that does not need to stop you from seeking out and talking with an understanding person. Building your own support system will help you regain confidence and strength within yourself.  RECOMMENDED READING The following materials are all available through [employer], [address] [place] [address]; phone [phone]. For Adults: Empty Arms by Ranelle Oyster Empty Cradle, Broken Heart by Marissa Calamity Ended Beginnings by C. Panuthos & C. Romeo Miscarriage a Hospital doctor. Resource Miscarriage - A Shattered Dream by Ranelle Oyster Newborn Death a Hospital doctor. Resource Still To Be Born by [employer] When a Administrator, arts by [employer]. Church et al (TCF) When [employer] Goodbye by P. Schwiebert and [place] Sellers When Pregnancy Fails by Chauncey Cruel [employer] Kettle For Helping Other Children How Do We Tell the Children? by [place] No New Baby by Hector Shade Talking About Death by Claudie Leach Where's Jess?? [place] Other helpful materials available locally at [employer]: The Fall of Freddy the Leaf by Bertram Denver, PhD, [facility] [place]. (for children) When Goodbye is Forever - Learning to Live Again After the Loss of a Child by Neomia Glass, [employer]    [place], [place] Same Day Access Hours:  Monday, Wednesday and Friday, [place] Crisis Hours: 7 days/wk, 8am -  8pm [address] [place], [place] [address] [phone]  [facility] [phone]  Mobile Crisis Unit [phone]   Therapists/Counselors/Psychologists   Karen [place], [place]    [phone]        [place], [place] [address]        Peggye Ley, [place] [phone] [place], [place] [address]  Lady Deutscher, [place]        [phone]        [address]      [place], [place] [address]        Bettey Mare, [place] [phone] [address] [place], [place] [address]   Dimas Aguas, LMFT       [phone]        [address], [place] [address]        Miguel Dibble [phone] [address] [place], [place] [address]  Rhona Raider        [phone]        [address]       [place], [place] [address]        Sena Hitch [phone] [address]  [address] [place], [place] [id number]  Tyron Russell, PsyD       [phone]        [address]       [place], [place] [address]        Elita Quick, LPC [phone] [address]  [place], [place] [address]   Debarah Crape        [phone]        [address] [place], [place] [address]         Rosana Hoes [facility] [phone] [email or web address]   Sation Konchella       [phone]        [address]       [place], [place] [address]        Morton Stall [facility] [phone] [email or web address]

## 2019-12-28 NOTE — Progress Notes (Signed)
  Postoperative Follow-up Patient presents post op from D&E for missed abortion, 1 week ago.  Subjective: Patient reports some improvement in her preop symptoms. Eating a regular diet without difficulty. Pain is controlled without any medications.  Activity: normal activities of daily living. Patient reports additional symptom's since surgery of Irregular bleeding.  Objective: BP (!) 148/98   Ht 5\' 4"  (1.626 m)   Wt 231 lb (104.8 kg)   LMP 10/10/2019 (Exact Date)   BMI 39.65 kg/m  Physical Exam Constitutional:      Appearance: She is well-developed.  Genitourinary:     Vagina and uterus normal.     No lesions in the vagina.     No cervical motion tenderness.     No right or left adnexal mass present.  HENT:     Head: Normocephalic and atraumatic.  Neck:     Thyroid: No thyromegaly.  Cardiovascular:     Rate and Rhythm: Normal rate and regular rhythm.     Heart sounds: Normal heart sounds.  Pulmonary:     Effort: Pulmonary effort is normal.     Breath sounds: Normal breath sounds.  Chest:     Breasts:        Right: No inverted nipple, mass, nipple discharge or skin change.        Left: No inverted nipple, mass, nipple discharge or skin change.  Abdominal:     General: Bowel sounds are normal. There is no distension.     Palpations: Abdomen is soft. There is no mass.  Musculoskeletal:     Cervical back: Neck supple.  Neurological:     Mental Status: She is alert and oriented to person, place, and time.  Skin:    General: Skin is warm and dry.  Psychiatric:        Behavior: Behavior normal.        Thought Content: Thought content normal.        Judgment: Judgment normal.  Vitals reviewed.    Assessment: s/p :  Dilation and Evacuation,  stable  Plan: Patient has done well after surgery with no apparent complications.  I have discussed the post-operative course to date, and the expected progress moving forward.  The patient understands what complications to be  concerned about.  I will see the patient in routine follow up, or sooner if needed.    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  Discussed normal Anora results.( Normal XY)  Activity plan: No restriction. Pelvic rest.   R  12/28/2019, 11:40 AM

## 2020-01-17 ENCOUNTER — Ambulatory Visit
Admission: EM | Admit: 2020-01-17 | Discharge: 2020-01-17 | Disposition: A | Discharge status: Home / Self Care | Payer: Medicaid Other | Attending: Emergency Medicine | Admitting: Emergency Medicine

## 2020-01-17 ENCOUNTER — Encounter: Payer: Self-pay | Admitting: Emergency Medicine

## 2020-01-17 ENCOUNTER — Other Ambulatory Visit: Payer: Self-pay

## 2020-01-17 DIAGNOSIS — M545 Low back pain, unspecified: Secondary | ICD-10-CM

## 2020-01-17 LAB — POCT URINE PREGNANCY: Preg Test, Ur: NEGATIVE

## 2020-01-17 MED ORDER — CYCLOBENZAPRINE HCL 10 MG PO TABS: 10.0000 mg | ORAL_TABLET | Freq: Every day | ORAL | 0 refills | Status: DC

## 2020-01-17 MED ORDER — PREDNISONE 20 MG PO TABS: 20.0000 mg | ORAL_TABLET | Freq: Two times a day (BID) | ORAL | 0 refills | Status: AC

## 2020-01-17 NOTE — Discharge Instructions (Signed)
Urine pregnancy negative Rest, ice and heat as needed Ensure adequate range of motion as tolerated. Injuries all appear to be muscular in nature at this time Prescribed prednisone Prescribed flexeril as needed at bedtime for muscle spasm.  Do not drive or operate heavy machinery while taking this medication It may take 3-4 weeks for complete resolution of symptoms Will f/u with her doctor or here if not seeing significant improvement within one week. Return here or go to ER if you have any new or worsening symptoms such as numbness/tingling of the inner thighs, loss of bladder or bowel control, headache/blurry vision, nausea/vomiting, confusion/altered mental status, dizziness, weakness, passing out, imbalance, etc..Marland Kitchen
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
## 2020-01-23 ENCOUNTER — Other Ambulatory Visit: Payer: Self-pay

## 2020-01-23 ENCOUNTER — Encounter: Payer: Self-pay | Admitting: Obstetrics and Gynecology

## 2020-01-23 ENCOUNTER — Ambulatory Visit (INDEPENDENT_AMBULATORY_CARE_PROVIDER_SITE_OTHER): Payer: Medicaid Other | Admitting: Obstetrics and Gynecology

## 2020-01-23 DIAGNOSIS — G43109 Migraine with aura, not intractable, without status migrainosus: Secondary | ICD-10-CM

## 2020-01-23 DIAGNOSIS — F322 Major depressive disorder, single episode, severe without psychotic features: Secondary | ICD-10-CM | POA: Diagnosis not present

## 2020-01-23 MED ORDER — BUTALBITAL-APAP-CAFFEINE 50-325-40 MG PO CAPS: 1.0000 | ORAL_CAPSULE | Freq: Four times a day (QID) | ORAL | 0 refills | Status: DC | PRN

## 2020-01-23 NOTE — Progress Notes (Signed)
Virtual Visit via Telephone Note  I connected with Alyssa Pearson on 01/23/20 at  8:10 AM EST by telephone and verified that I am speaking with the correct person using two identifiers.   I discussed the limitations, risks, security and privacy concerns of performing an evaluation and management service by telephone and the availability of in person appointments. I also discussed with the patient that there may be a patient responsible charge related to this service. The patient expressed understanding and agreed to proceed.  The patient was at home I spoke with the patient from my  office The names of people involved in this encounter were: Bland Span and Dr. Jerene Pitch.   History of Present Illness: She reports that her depression has improved with taking Lexapro.  She is able to get out of bed and has been doing more activities that she enjoys.  She has not been crying constantly and feels like she is able to think about the event without getting overly upset.  She does complain of new onset headache the headache is severe and has been present for 3 days.  She is taking ibuprofen 600 mg without relief.  She feels the headache across the front of her forehead.  The headache is made worse with light and sound.  She does not note vision changes but she has not put her glasses on today.  This is a new symptom for her she does not generally get migraines.  She reports that her period came on Friday 01/18/2019 it was heavy for 3 days but has now resolved and is lighter.   Observations/Objective:  Physical Exam could not be performed. Because of the COVID-19 outbreak this visit was performed over the phone and not in person.   Assessment and Plan: A/P 34 year old with depression anxiety following recent miscarriage. Lexapro appears to be working well for patient.  She has a new symptom of a migraine.  Headaches can be a common side effect of left Lexapro.  Discussed this with the patient.  Sent  prescription for Fioricet to the patient's pharmacy.  Recommended that patient try medication.  If this medication does not help we recommend follow-up in urgent care or emergency department for migraine management.  If headaches recur would consider changing medication to a different SSRI or SNRI.  Follow-up planned as needed.   Follow Up Instructions: Follow-up as needed   I discussed the assessment and treatment plan with the patient. The patient was provided an opportunity to ask questions and all were answered. The patient agreed with the plan and demonstrated an understanding of the instructions.   The patient was advised to call back or seek an in-person evaluation if the symptoms worsen or if the condition fails to improve as anticipated.  I provided 12 minutes of non-face-to-face time during this encounter.  Adelene Idler MD [facility], [facility] 01/23/2020 8:24 AM

## 2020-02-28 ENCOUNTER — Other Ambulatory Visit: Payer: Self-pay | Admitting: Obstetrics and Gynecology

## 2020-02-28 DIAGNOSIS — I10 Essential (primary) hypertension: Secondary | ICD-10-CM

## 2020-04-27 DIAGNOSIS — R519 Headache, unspecified: Secondary | ICD-10-CM | POA: Diagnosis not present

## 2020-04-27 DIAGNOSIS — I10 Essential (primary) hypertension: Secondary | ICD-10-CM | POA: Diagnosis not present

## 2020-04-27 DIAGNOSIS — Z79899 Other long term (current) drug therapy: Secondary | ICD-10-CM | POA: Insufficient documentation

## 2020-04-27 DIAGNOSIS — R778 Other specified abnormalities of plasma proteins: Secondary | ICD-10-CM | POA: Diagnosis not present

## 2020-04-27 DIAGNOSIS — Z87891 Personal history of nicotine dependence: Secondary | ICD-10-CM | POA: Insufficient documentation

## 2020-04-27 DIAGNOSIS — R7989 Other specified abnormal findings of blood chemistry: Secondary | ICD-10-CM | POA: Diagnosis not present

## 2020-04-27 DIAGNOSIS — R42 Dizziness and giddiness: Secondary | ICD-10-CM | POA: Diagnosis not present

## 2020-04-28 ENCOUNTER — Emergency Department: Payer: Medicaid Other

## 2020-04-28 ENCOUNTER — Other Ambulatory Visit: Payer: Self-pay

## 2020-04-28 ENCOUNTER — Emergency Department
Admission: EM | Admit: 2020-04-28 | Discharge: 2020-04-28 | Disposition: A | Discharge status: Home / Self Care | Payer: Medicaid Other | Attending: Student | Admitting: Student

## 2020-04-28 ENCOUNTER — Encounter: Payer: Self-pay | Admitting: Emergency Medicine

## 2020-04-28 DIAGNOSIS — R42 Dizziness and giddiness: Secondary | ICD-10-CM

## 2020-04-28 DIAGNOSIS — R519 Headache, unspecified: Secondary | ICD-10-CM

## 2020-04-28 DIAGNOSIS — R778 Other specified abnormalities of plasma proteins: Secondary | ICD-10-CM

## 2020-04-28 LAB — BASIC METABOLIC PANEL
Anion gap: 9 (ref 5–15)
BUN: 8 mg/dL (ref 6–20)
CO2: 24 mmol/L (ref 22–32)
Calcium: 9 mg/dL (ref 8.9–10.3)
Chloride: 105 mmol/L (ref 98–111)
Creatinine, Ser: 0.72 mg/dL (ref 0.44–1.00)
GFR calc Af Amer: >60 mL/min (ref >60)
GFR calc non Af Amer: >60 mL/min (ref >60)
Glucose, Bld: 115 mg/dL — ABNORMAL HIGH (ref 70–99)
Potassium: 3.5 mmol/L (ref 3.5–5.1)
Sodium: 138 mmol/L (ref 135–145)

## 2020-04-28 LAB — CBC
HCT: 34.3 % — ABNORMAL LOW (ref 36.0–46.0)
Hemoglobin: 11.2 g/dL — ABNORMAL LOW (ref 12.0–15.0)
MCH: 28.2 pg (ref 26.0–34.0)
MCHC: 32.7 g/dL (ref 30.0–36.0)
MCV: 86.4 fL (ref 80.0–100.0)
Platelets: 293 10*3/uL (ref 150–400)
RBC: 3.97 MIL/uL (ref 3.87–5.11)
RDW: 16.1 % — ABNORMAL HIGH (ref 11.5–15.5)
WBC: 9.3 10*3/uL (ref 4.0–10.5)
nRBC: 0 % (ref 0.0–0.2)

## 2020-04-28 LAB — URINALYSIS, COMPLETE (UACMP) WITH MICROSCOPIC
Bilirubin Urine: NEGATIVE
Glucose, UA: NEGATIVE mg/dL
Ketones, ur: NEGATIVE mg/dL
Nitrite: NEGATIVE
Protein, ur: 30 mg/dL — AB
Specific Gravity, Urine: 1.027 (ref 1.005–1.030)
pH: 6 (ref 5.0–8.0)

## 2020-04-28 LAB — TROPONIN I (HIGH SENSITIVITY)
Troponin I (High Sensitivity): 39 ng/L — ABNORMAL HIGH (ref <18)
Troponin I (High Sensitivity): 47 ng/L — ABNORMAL HIGH (ref <18)

## 2020-04-28 LAB — PREGNANCY, URINE: Preg Test, Ur: NEGATIVE

## 2020-04-28 MED ORDER — METOCLOPRAMIDE HCL 5 MG/ML IJ SOLN
10.0000 mg | Freq: Once | INTRAMUSCULAR | Status: AC
  Administered 2020-04-28: 10 mg via INTRAVENOUS
  Filled 2020-04-28: qty 2

## 2020-04-28 MED ORDER — GADOBUTROL 1 MMOL/ML IV SOLN
7.5000 mL | Freq: Once | INTRAVENOUS | Status: AC | PRN
  Administered 2020-04-28: 7.5 mL via INTRAVENOUS

## 2020-04-28 MED ORDER — LACTATED RINGERS IV BOLUS
1000.0000 mL | Freq: Once | INTRAVENOUS | Status: AC
  Administered 2020-04-28: 1000 mL via INTRAVENOUS

## 2020-04-28 MED ORDER — MECLIZINE HCL 25 MG PO TABS
25.0000 mg | ORAL_TABLET | Freq: Once | ORAL | Status: AC
  Administered 2020-04-28: 25 mg via ORAL
  Filled 2020-04-28: qty 1

## 2020-04-28 MED ORDER — KETOROLAC TROMETHAMINE 30 MG/ML IJ SOLN
15.0000 mg | Freq: Once | INTRAMUSCULAR | Status: AC
  Administered 2020-04-28: 15 mg via INTRAVENOUS
  Filled 2020-04-28: qty 1

## 2020-04-28 NOTE — ED Provider Notes (Signed)
[facility] Emergency Department Provider Note  ____________________________________________  Time seen: Approximately 5:16 AM  I have reviewed the triage vital signs and the nursing notes.   HISTORY  Chief Complaint Dizziness   HPI Alyssa Pearson is a 34 y.o. female with a history of hypertension, GERD, depression, anemia, anxiety who presents for evaluation of headache and dizziness.  Patient reports 2 weeks of dizziness which she describes as room spinning sensation but sometimes also lightheadedness.  She reports that her symptoms are worse when she is moving around.  If she sits down and stays still the symptoms improve.  She reports having intermittent left ear pain for the last several weeks.  No sore throat or congestion, no tinnitus, no diplopia, dysarthria, dysphagia, unilateral weakness or numbness, facial droop, or difficulty finding words.  She reports that she has been feeling unwell for the last few days and today has had a headache for most of the day.  The headache started mild and became progressively worse throughout the day.  At this time the headache severe, throbbing, generalized.  She does have a history of similar headaches but not as intense as this one.  No thunderclap headache, no syncope, no chest pain, no shortness of breath, no cough, no fever, no sore throat, no loss of taste or smell, no Covid vaccination, no neck stiffness.  No personal nor family history of aneurysm or subarachnoid hemorrhage.  She has had nausea and vomiting associated with the dizziness but no abdominal pain.   Past Medical History:  Diagnosis Date  . Anemia   . Anxiety    h/o   . Arthritis    bil feet  . Depression    h/o  . GERD (gastroesophageal reflux disease)    h/o  . Headache    migraines  . Hypertension     Patient Active Problem List   Diagnosis Date Noted  . History of reversal of tubal ligation 09/17/2017    Past Surgical History:   Procedure Laterality Date  . CHROMOPERTUBATION N/A 09/14/2019   Procedure: CHROMOPERTUBATION;  Surgeon: Natale Milch, MD;  Location: [facility] ORS;  Service: Gynecology;  Laterality: N/A;  . DILATION AND EVACUATION N/A 12/19/2019   Procedure: DILATATION AND EVACUATION;  Surgeon: Natale Milch, MD;  Location: [facility] OR[facility];  Service: Gynecology;  Laterality: N/A;  . HYSTEROSCOPY WITH D & C N/A 09/14/2019   Procedure: DILATATION AND CURETTAGE /HYSTEROSCOPY, POLYPECTOMY;  Surgeon: Natale Milch, MD;  Location: [facility] OR[facility];  Service: Gynecology;  Laterality: N/A;  . LAPAROSCOPY N/A 09/14/2019   Procedure: LAPAROSCOPY DIAGNOSTIC;  Surgeon: Natale Milch, MD;  Location: [facility] OR[facility];  Service: Gynecology;  Laterality: N/A;  . TUBAL LIGATION    . Tubal reversal  08/31/2017  . WISDOM TOOTH EXTRACTION      Prior to Admission medications   Medication Sig Start Date End Date Taking? Authorizing Provider  acetaminophen (TYLENOL) 500 MG tablet Take 2 tablets (1,000 mg total) by mouth every 6 (six) hours as needed for moderate pain or headache. Patient not taking: Reported on 01/23/2020 12/19/19   Adelene Idler R, MD  amLODipine (NORVASC) 5 MG tablet TAKE 1 TABLET BY MOUTH EVERY DAY 02/28/20   Schuman, Christanna R, MD  ascorbic acid (VITAMIN C) 500 MG tablet Take 500 mg by mouth daily.    [provider]  Butalbital-APAP-Caffeine [id number] MG capsule Take 1 capsule by mouth every 6 (six) hours as needed for headache. 01/23/20   Jerene Pitch, Denman George  R, MD  cyclobenzaprine (FLEXERIL) 10 MG tablet Take 1 tablet (10 mg total) by mouth at bedtime. Patient not taking: Reported on 01/23/2020 01/17/20   Wurst, [place], PA-C  escitalopram (LEXAPRO) 10 MG tablet Take 1 tablet (10 mg total) by mouth daily. After 1 week increase to 20 mg daily 12/28/19   Adrian Prows R, MD  ibuprofen (ADVIL) 600 MG tablet Take 1 tablet (600 mg total) by mouth every 6 (six) hours as needed. Patient not  taking: Reported on 12/28/2019 12/19/19   Homero Fellers, MD  Prenatal Vit-Fe Fumarate-FA (PRENATAL PO) Take 1 tablet by mouth daily.     [provider]  prochlorperazine (COMPAZINE) 10 MG tablet Take 1 tablet (10 mg total) by mouth every 8 (eight) hours as needed for nausea or vomiting (headache). Patient not taking: Reported on 12/18/2019 11/23/19   Will Bonnet, MD  simethicone (GAS-X) 80 MG chewable tablet Chew 1 tablet (80 mg total) by mouth every 6 (six) hours as needed for flatulence. Patient not taking: Reported on 01/23/2020 09/21/19   Homero Fellers, MD    Allergies Patient has no known allergies.  Family History  Problem Relation Age of Onset  . Hypertension Mother   . Diabetes Father   . Hypertension Father     Social History Social History   Tobacco Use  . Smoking status: Former Smoker    Packs/day: 0.50    Years: 7.00    Pack years: 3.50    Types: Cigarettes    Quit date: 09/10/2009    Years since quitting: 10.6  . Smokeless tobacco: Never Used  Substance Use Topics  . Alcohol use: No  . Drug use: No    Review of Systems  Constitutional: Negative for fever. + Dizziness Eyes: Negative for visual changes. ENT: Negative for sore throat. Neck: No neck pain  Cardiovascular: Negative for chest pain. Respiratory: Negative for shortness of breath. Gastrointestinal: Negative for abdominal pain, diarrhea. + N/V Genitourinary: Negative for dysuria. Musculoskeletal: Negative for back pain. Skin: Negative for rash. Neurological: Negative for  weakness or numbness. + HA Psych: No SI or HI  ____________________________________________   PHYSICAL EXAM:  VITAL SIGNS: ED Triage Vitals  Enc Vitals Group     BP 04/28/20 0005 (!) 169/101     Pulse Rate 04/28/20 0005 73     Resp 04/28/20 0005 18     Temp 04/28/20 0005 98.9 F (37.2 C)     Temp Source 04/28/20 0005 Oral     SpO2 04/28/20 0005 99 %     Weight 04/28/20 0006 220 lb (99.8  kg)     Height 04/28/20 0006 5\' 4"  (1.626 m)     Head Circumference --      Peak Flow --      Pain Score 04/28/20 0006 10     Pain Loc --      Pain Edu? --      Excl. in [place]? --     Constitutional: Alert and oriented. Well appearing and in no apparent distress. HEENT:      Head: Normocephalic and atraumatic.         Eyes: Conjunctivae are normal. Sclera is non-icteric. PERRL, EOMI, no nystagmus      Mouth/Throat: Mucous membranes are moist.       Neck: Supple with no signs of meningismus. Cardiovascular: Regular rate and rhythm. No murmurs, gallops, or rubs.  Respiratory: Normal respiratory effort. Lungs are clear to auscultation bilaterally. No wheezes, crackles, or  rhonchi.  Gastrointestinal: Soft, non tender, and non distended  Musculoskeletal: No edema, cyanosis, or erythema of extremities. Neurologic: Normal speech and language. Face is symmetric.  Intact strength and sensation x4, no dysmetria, no pronator drift Skin: Skin is warm, dry and intact. No rash noted. Psychiatric: Mood and affect are normal. Speech and behavior are normal.  ____________________________________________   LABS (all labs ordered are listed, but only abnormal results are displayed)  Labs Reviewed  BASIC METABOLIC PANEL - Abnormal; Notable for the following components:      Result Value   Glucose, Bld 115 (*)    All other components within normal limits  CBC - Abnormal; Notable for the following components:   Hemoglobin 11.2 (*)    HCT 34.3 (*)    RDW 16.1 (*)    All other components within normal limits  URINALYSIS, COMPLETE (UACMP) WITH MICROSCOPIC - Abnormal; Notable for the following components:   Color, Urine YELLOW (*)    APPearance CLOUDY (*)    Hgb urine dipstick SMALL (*)    Protein, ur 30 (*)    Leukocytes,Ua MODERATE (*)    Bacteria, UA RARE (*)    All other components within normal limits  TROPONIN I (HIGH SENSITIVITY) - Abnormal; Notable for the following components:   Troponin  I (High Sensitivity) 47 (*)    All other components within normal limits  TROPONIN I (HIGH SENSITIVITY) - Abnormal; Notable for the following components:   Troponin I (High Sensitivity) 39 (*)    All other components within normal limits  PREGNANCY, URINE  CBG MONITORING, ED   ____________________________________________  EKG  ED ECG REPORT I, Nita Sickle, the attending physician, personally viewed and interpreted this ECG.  Normal sinus rhythm, rate of 70, normal intervals, normal axis, inferior TWI, no ST elevations or depressions.  Unchanged from prior. ____________________________________________  RADIOLOGY  I have personally reviewed the images performed during this visit and I agree with the Radiologist's read.   Interpretation by Radiologist:  CT Head Wo Contrast  Result Date: 04/28/2020 CLINICAL DATA:  Dizziness, headache, vomiting EXAM: CT HEAD WITHOUT CONTRAST TECHNIQUE: Contiguous axial images were obtained from the base of the skull through the vertex without intravenous contrast. COMPARISON:  None. FINDINGS: Brain: No evidence of acute infarction, hemorrhage, hydrocephalus, extra-axial collection or mass lesion/mass effect. Vascular: No hyperdense vessel or unexpected calcification. Skull: Normal. Negative for fracture or focal lesion. Sinuses/Orbits: The visualized paranasal sinuses are essentially clear. The mastoid air cells are unopacified. Other: None. IMPRESSION: Normal head CT. Electronically Signed   By: Charline Bills M.D.   On: 04/28/2020 05:53     ____________________________________________   PROCEDURES  Procedure(s) performed: yes .1-3 Lead EKG Interpretation Performed by: Nita Sickle, MD Authorized by: Nita Sickle, MD     Interpretation: normal     ECG rate assessment: normal     Rhythm: sinus rhythm     Ectopy: none     Conduction: normal     Critical Care performed:  None ____________________________________________    INITIAL IMPRESSION / ASSESSMENT AND PLAN / ED COURSE  34 y.o. female with a history of hypertension, GERD, depression, anemia, anxiety who presents for evaluation of 2 weeks of intermittent dizziness associated with nausea and vomiting and now with 24 hrs of HA.No thunderclap headache, no meningeal signs, no neurological deficits, no fever or chills.  EKG showing no acute ischemic changes.  Patient placed on telemetry for close monitoring.  Old medical records review.  Initial troponin was elevated  at 97 with a repeat of 39.  Patient denies ever having any chest pain or shortness of breath.  No personal or family history of blood clots, no recent travel immobilization, no leg pain or swelling, no hemoptysis or exogenous hormones.  Due to the elevated troponin patient was sent for a CT head which showed no acute abnormalities , confirmed by radiology. No prior troponin ever done that I can see on Epic. Will give migraine cocktail.  _________________________ 7:29 AM on 04/28/2020 -----------------------------------------  Headache has resolved.  Repeat troponin persistently elevated although trending down.  Patient denies any chest pain or shortness of breath.  Since she does have a headache and the CT was done greater than 6 hours from the onset of her headache I will pursue an MRI/MRA/MRV to rule out head bleed versus aneurysm versus central venous thrombosis as possible cause of patient headache, vertigo, and troponin leak.  If the MRI is negative plan to discharge home with follow-up with cardiology.  Care transferred to Dr. Colon Branch.      _____________________________________________ Please note:  Patient was evaluated in Emergency Department today for the symptoms described in the history of present illness. Patient was evaluated in the context of the global COVID-19 pandemic, which necessitated consideration that the patient might be at risk for infection with the SARS-CoV-2 virus that causes  COVID-19. Institutional protocols and algorithms that pertain to the evaluation of patients at risk for COVID-19 are in a state of rapid change based on information released by regulatory bodies including the CDC and federal and state organizations. These policies and algorithms were followed during the patient's care in the ED.  Some ED evaluations and interventions may be delayed as a result of limited staffing during the pandemic.   [place] Controlled Substance Database was reviewed by me. ____________________________________________   FINAL CLINICAL IMPRESSION(S) / ED DIAGNOSES   Final diagnoses:  Vertigo  Acute nonintractable headache, unspecified headache type  Elevated troponin      NEW MEDICATIONS STARTED DURING THIS VISIT:  ED Discharge Orders    None       Note:  This document was prepared using Dragon voice recognition software and may include unintentional dictation errors.    Don Perking, [place], MD 04/28/20 [phone]

## 2020-04-28 NOTE — ED Provider Notes (Signed)
MRI brain normal.  Negative MRV.  Negative intracranial MRA.  Updated patient on results.  As such, feel patient is stable for discharge with outpatient follow-up with cardiology as planned.  She voices understanding of the plan and is comfortable with discharge.  Given return precautions.   Radiology MR Brain - IMPRESSION:  Normal appearance of the brain.   MRV - IMPRESSION:  Negative MRV.   MRA Head - IMPRESSION:  Negative intracranial MRA.    Miguel Aschoff., MD 04/28/20 [phone]

## 2020-04-28 NOTE — ED Notes (Signed)
Pt transported to MRI 

## 2020-04-28 NOTE — ED Triage Notes (Signed)
Pt arrives POV to triage with c/o dizziness x 2 weeks with associated emesis at times. Pt also reports HA at this time. Pt is ambulatory to triage and in NAD.

## 2020-04-28 NOTE — ED Notes (Signed)
Pt verbalized understanding of discharge instructions. NAD at this time. 

## 2020-04-28 NOTE — Discharge Instructions (Addendum)
As I explained to you, your cardiac markers were elevated for unknown reason therefore it is imperative that you follow-up with cardiology for further evaluation to determine that you do not have any blockages in the arteries of your heart.  Drink plenty of fluids.  Avoid alcohol.  Take ibuprofen or Tylenol for headache.  Follow-up with your PCP.

## 2020-04-28 NOTE — ED Notes (Signed)
Pt transported to CT ?

## 2020-04-30 ENCOUNTER — Ambulatory Visit: Payer: Medicaid Other

## 2020-04-30 ENCOUNTER — Ambulatory Visit: Payer: Medicaid Other | Admitting: Internal Medicine

## 2020-04-30 ENCOUNTER — Encounter: Payer: Self-pay | Admitting: Internal Medicine

## 2020-04-30 VITALS — BP 139/84 | HR 66 | Temp 98.4°F | Ht 64.75 in | Wt 235.2 lb

## 2020-04-30 DIAGNOSIS — R778 Other specified abnormalities of plasma proteins: Secondary | ICD-10-CM | POA: Insufficient documentation

## 2020-04-30 DIAGNOSIS — F53 Postpartum depression: Secondary | ICD-10-CM

## 2020-04-30 DIAGNOSIS — R7989 Other specified abnormal findings of blood chemistry: Secondary | ICD-10-CM | POA: Diagnosis not present

## 2020-04-30 DIAGNOSIS — R112 Nausea with vomiting, unspecified: Secondary | ICD-10-CM | POA: Diagnosis not present

## 2020-04-30 DIAGNOSIS — F329 Major depressive disorder, single episode, unspecified: Secondary | ICD-10-CM

## 2020-04-30 DIAGNOSIS — F32A Depression, unspecified: Secondary | ICD-10-CM | POA: Insufficient documentation

## 2020-04-30 MED ORDER — ONDANSETRON HCL 4 MG PO TABS: 4.0000 mg | ORAL_TABLET | Freq: Every day | ORAL | 1 refills | Status: DC | PRN

## 2020-04-30 NOTE — Assessment & Plan Note (Signed)
Mentions prior hx of depression and anxiety exacerbated by miscarriage in 1/20201. Previously treated with Lexapro but mentions had improvement in symptoms with time and stopped taking anti-depressants. Current PHQ-9 of 11. Not interested in taking anti-depressants at this time.

## 2020-04-30 NOTE — Assessment & Plan Note (Signed)
[name] is a 34 yo F presenting to [facility] to establish care. She is presenting with complaint of dizziness, nausea and vomiting. She was in her usual state of health until 2 weeks ago when she developed acute onset dizziness with headache. She mentions her symptoms also accompanied with nausea and NBNB emesis. States she describes room-spinning. No obvious inciting factor. Relief by sleeping. She mentions hx of recent miscarriage in January requiring D&C and assuming she was pregnant but multiple home-pregnancy tests were negative. She went to the ED for evaluation due to persistent symptoms with extensive work-up with negative results. She denies any prior episode of this constellation of symptoms.  A/P Dizziness, nausea and vomiting of unclear etiology. Chart review shows extensive head/brain imaging on prior ED visit with negative MRV, MRA, CT head. Urine pregnancy at the time also negative. No electrolyte abnormalities. On exam, no neurological or vestibular dysfunction elicited. Considering current sexual hx (partner of 3 years. Sexually active. No contraception. Trying to become pregnant), will repeat pregnancy test. Also mentions recent weight gain and family hx of diabetes. Will test for thyroid dysfunction and diabetes as well.  - Zofran for nausea, OTC meclizine for dizziness - Hgb a1c, TSH, beta-hcg

## 2020-04-30 NOTE — Patient Instructions (Signed)
Thank you for allowing [place] to provide your care today. Today we discussed your nausea and vomiting    I have ordered TSH, Hgb a1c, and pregnancy test labs for you. I will call if any are abnormal.    Today we made the following changes to your medications.    Please take Zofran as needed for nausea and vomiting  Please follow-up in 3 months.    Should you have any questions or concerns please call the internal medicine clinic at [phone].     Nausea and Vomiting, Adult Nausea is feeling sick to your stomach or feeling that you are about to throw up (vomit). Vomiting is when food in your stomach is thrown up and out of the mouth. Throwing up can make you feel weak. It can also make you lose too much water in your body (get dehydrated). If you lose too much water in your body, you may:  Feel tired.  Feel thirsty.  Have a dry mouth.  Have cracked lips.  Go pee (urinate) less often. Older adults and people with other diseases or a weak body defense system (immune system) are at higher risk for losing too much water in the body. If you feel sick to your stomach and you throw up, it is important to follow instructions from your doctor about how to take care of yourself. Follow these instructions at home: Watch your symptoms for any changes. Tell your doctor about them. Follow these instructions to care for yourself at home. Eating and drinking      Take an ORS (oral rehydration solution). This is a drink that is sold at pharmacies and stores.  Drink clear fluids in small amounts as you are able, such as: ? Water. ? Ice chips. ? Fruit juice that has water added (diluted fruit juice). ? Low-calorie sports drinks.  Eat bland, easy-to-digest foods in small amounts as you are able, such as: ? Bananas. ? Applesauce. ? Rice. ? Low-fat (lean) meats. ? Toast. ? Crackers.  Avoid drinking fluids that have a lot of sugar or caffeine in them. This includes energy drinks, sports  drinks, and soda.  Avoid alcohol.  Avoid spicy or fatty foods. General instructions  Take over-the-counter and prescription medicines only as told by your doctor.  Drink enough fluid to keep your pee (urine) pale yellow.  Wash your hands often with soap and water. If you cannot use soap and water, use hand sanitizer.  Make sure that all people in your home wash their hands well and often.  Rest at home while you get better.  Watch your condition for any changes.  Take slow and deep breaths when you feel sick to your stomach.  Keep all follow-up visits as told by your doctor. This is important. Contact a doctor if:  Your symptoms get worse.  You have new symptoms.  You have a fever.  You cannot drink fluids without throwing up.  You feel sick to your stomach for more than 2 days.  You feel light-headed or dizzy.  You have a headache.  You have muscle cramps.  You have a rash.  You have pain while peeing. Get help right away if:  You have pain in your chest, neck, arm, or jaw.  You feel very weak or you pass out (faint).  You throw up again and again.  You have throw up that is bright red or looks like black coffee grounds.  You have bloody or black poop (stools) or poop  that looks like tar.  You have a very bad headache, a stiff neck, or both.  You have very bad pain, cramping, or bloating in your belly (abdomen).  You have trouble breathing.  You are breathing very quickly.  Your heart is beating very quickly.  Your skin feels cold and clammy.  You feel confused.  You have signs of losing too much water in your body, such as: ? Dark pee, very little pee, or no pee. ? Cracked lips. ? Dry mouth. ? Sunken eyes. ? Sleepiness. ? Weakness. These symptoms may be an emergency. Do not wait to see if the symptoms will go away. Get medical help right away. Call your local emergency services (911 in the [place] Do not drive yourself to the  hospital. Summary  Nausea is feeling sick to your stomach or feeling that you are about to throw up (vomit). Vomiting is when food in your stomach is thrown up and out of the mouth.  Follow instructions from your doctor about eating and drinking to keep from losing too much water in your body.  Take over-the-counter and prescription medicines only as told by your doctor.  Contact your doctor if your symptoms get worse or you have new symptoms.  Keep all follow-up visits as told by your doctor. This is important. This information is not intended to replace advice given to you by your health care provider. Make sure you discuss any questions you have with your health care provider. Document Revised: 03/17/2019 Document Reviewed: 05/03/2018 Elsevier Patient Education  2020 [employer].

## 2020-04-30 NOTE — Assessment & Plan Note (Addendum)
Incidental finding of elevated troponin at recent ED visit. She denies any chest pain in the past or currently. Denies any significant risk factors but states she was told by ED providers to f/u with cardiology. Denies any significant family history of early heart disease. No obvious risk factors on chart review. EKG from ED reviewed with T wave inversions on anterior leads in addition to isolated inversions on lead III and V1.  Discussed w/ Alyssa Pearson with no indication for further cardiac work-up for isolated EKG changes but requests referral as instructed by ED providers.  - Referral for cardiology per patient request

## 2020-04-30 NOTE — Progress Notes (Signed)
CC: Nausea, vomiting  HPI: Alyssa Pearson is a 34 y.o. with PMH listed below presenting with complaint of nausea, vomiting and dizziness. Please see problem based assessment and plan for further details.  Past Medical History:  Diagnosis Date  . Anemia   . Anxiety    h/o   . Arthritis    bil feet  . Depression    h/o  . GERD (gastroesophageal reflux disease)    h/o  . Headache    migraines  . Hypertension    Past Surgical History:  Procedure Laterality Date  . CHROMOPERTUBATION N/A 09/14/2019   Procedure: CHROMOPERTUBATION;  Surgeon: Natale Milch, MD;  Location: [facility];  Service: Gynecology;  Laterality: N/A;  . DILATION AND EVACUATION N/A 12/19/2019   Procedure: DILATATION AND EVACUATION;  Surgeon: Natale Milch, MD;  Location: [facility];  Service: Gynecology;  Laterality: N/A;  . HYSTEROSCOPY WITH D & C N/A 09/14/2019   Procedure: DILATATION AND CURETTAGE /HYSTEROSCOPY, POLYPECTOMY;  Surgeon: Natale Milch, MD;  Location: [facility];  Service: Gynecology;  Laterality: N/A;  . LAPAROSCOPY N/A 09/14/2019   Procedure: LAPAROSCOPY DIAGNOSTIC;  Surgeon: Natale Milch, MD;  Location: [facility];  Service: Gynecology;  Laterality: N/A;  . TUBAL LIGATION    . Tubal reversal  08/31/2017  . WISDOM TOOTH EXTRACTION     Social History Currently works with processing unemployment benefits. Lives with partner of 3  Years. Denies any alcohol, tobacco, illicit substance use.  Family History Diabetes and hypertension on multiple members of family. Sister had breast cancer but unclear on details  Review of Systems: Review of Systems  Constitutional: Negative for chills, fever and malaise/fatigue.  HENT: Negative for hearing loss.   Eyes: Negative for blurred vision and double vision.  Respiratory: Negative for shortness of breath.   Cardiovascular: Negative for chest pain, palpitations and leg swelling.  Gastrointestinal: Negative for constipation,  diarrhea, nausea and vomiting.  Genitourinary: Negative for dysuria.  Musculoskeletal: Negative for joint pain.  Neurological: Negative for dizziness and headaches.  All other systems reviewed and are negative.    Physical Exam: Vitals:   04/30/20 0939  BP: 139/84  Pulse: 66  Temp: 98.4 F (36.9 C)  TempSrc: Oral  SpO2: 98%  Weight: 235 lb 3.2 oz (106.7 kg)  Height: 5' 4.75" (1.645 m)   Physical Exam  Constitutional: She appears well-developed and well-nourished. No distress.  HENT:  Mouth/Throat: Oropharynx is clear and moist.  Negative Dix-Hallpike test. Negative Head-Impulse Test  Eyes: Conjunctivae are normal.  Cardiovascular: Normal rate, regular rhythm, normal heart sounds and intact distal pulses.  No murmur heard. Respiratory: Effort normal and breath sounds normal. She has no wheezes. She has no rales.  GI: Soft. Bowel sounds are normal. She exhibits no distension. There is no abdominal tenderness.  Musculoskeletal:        General: No edema. Normal range of motion.  Neurological:  Neurologic exam: Mental status: A&Ox3 Cranial Nerves: II: PERRL III, IV, VI: Extra-occular motions intact bilaterally V, VII: Face symmetric, sensation intact in all 3 divisions  VIII: hearing normal to rubbing fingers bilaterally  IX, X: palate rises symmetrically XI: Head turn and shoulder shrug normal bilaterally  XII: tongue midline  Motor: Strength 5/5 on all upper and lower extremities, bulk muscle and tone are normal  Gait:Normal Sensory: Light touch intact and symmetric bilaterally  Coordination: There is no dysmetria on finger-to-nose.  Psychiatric: Normal mood and affect     Assessment & Plan:  Nausea and vomiting in adult Alyssa Pearson is a 34 yo F presenting to [facility] to establish care. She is presenting with complaint of dizziness, nausea and vomiting. She was in her usual state of  health until 2 weeks ago when she developed acute onset dizziness with headache. She mentions her symptoms also accompanied with nausea and NBNB emesis. States she describes room-spinning. No obvious inciting factor. Relief by sleeping. She mentions hx of recent miscarriage in January requiring D&C and assuming she was pregnant but multiple home-pregnancy tests were negative. She went to the ED for evaluation due to persistent symptoms with extensive work-up with negative results. She denies any prior episode of this constellation of symptoms.  A/P Dizziness, nausea and vomiting of unclear etiology. Chart review shows extensive head/brain imaging on prior ED visit with negative MRV, MRA, CT head. Urine pregnancy at the time also negative. No electrolyte abnormalities. On exam, no neurological or vestibular dysfunction elicited. Considering current sexual hx (partner of 3 years. Sexually active. No contraception. Trying to become pregnant), will repeat pregnancy test. Also mentions recent weight gain and family hx of diabetes. Will test for thyroid dysfunction and diabetes as well.  - Zofran for nausea, OTC meclizine for dizziness - Hgb a1c, TSH, beta-hcg  Depression Mentions prior hx of depression and anxiety exacerbated by miscarriage in 1/20201. Previously treated with Lexapro but mentions had improvement in symptoms with time and stopped taking anti-depressants. Current PHQ-9 of 11. Not interested in taking anti-depressants at this time.  Elevated troponin Incidental finding of elevated troponin at recent ED visit. She denies any chest pain in the past or currently. Denies any significant risk factors but states she was told by ED providers to f/u with cardiology. Denies any significant family history of early heart disease. No obvious risk factors on chart review. EKG from ED reviewed with T wave inversions on anterior leads in addition to isolated inversions on lead III and V1.  Discussed w/  [name] with no indication for further cardiac work-up for isolated EKG changes but requests referral as instructed by ED providers.  - Referral for cardiology per patient request    Patient discussed with Dr. Lynnae January   -Gilberto Better, PGY2 [facility] Pager: [phone]

## 2020-05-01 ENCOUNTER — Ambulatory Visit: Payer: Medicaid Other

## 2020-05-01 LAB — HEMOGLOBIN A1C
Est. average glucose Bld gHb Est-mCnc: 126 mg/dL
Hgb A1c MFr Bld: 6 % — ABNORMAL HIGH (ref 4.8–5.6)

## 2020-05-01 LAB — TSH: TSH: 1.18 u[IU]/mL (ref 0.450–4.500)

## 2020-05-01 LAB — HCG, SERUM, QUALITATIVE: hCG,Beta Subunit,Qual,Serum: NEGATIVE m[IU]/mL (ref <6)

## 2020-05-01 NOTE — Progress Notes (Signed)
Internal Medicine Clinic Attending ° °Case discussed with Dr. Lee at the time of the visit.  We reviewed the resident’s history and exam and pertinent patient test results.  I agree with the assessment, diagnosis, and plan of care documented in the resident’s note.  °

## 2020-05-02 ENCOUNTER — Telehealth: Payer: Self-pay | Admitting: Internal Medicine

## 2020-05-02 DIAGNOSIS — R7303 Prediabetes: Secondary | ICD-10-CM

## 2020-05-02 MED ORDER — METFORMIN HCL 500 MG PO TABS: 500.0000 mg | ORAL_TABLET | Freq: Two times a day (BID) | ORAL | 2 refills | Status: DC

## 2020-05-02 NOTE — Telephone Encounter (Signed)
Spoke with [name] regarding her lab results including hgb a1c of 6.0 suggesting pre-diabetes. Discussed ADA guideline in considering of metformin therapy for those with pre-diabetes w/ BMI >35, age <60. Discussed possible GI side effects and risk of B12 deficiency with long-term use and alternative option of relying on lifestyle interventions including increased exercise and dietary changes. [name] expressed understanding and states she would like to trial metformin especially with concern regarding progression due to multiple family members with diabetes. Script for metformin sent.

## 2020-05-14 ENCOUNTER — Encounter: Payer: Self-pay | Admitting: Cardiology

## 2020-05-14 ENCOUNTER — Ambulatory Visit (INDEPENDENT_AMBULATORY_CARE_PROVIDER_SITE_OTHER): Payer: Medicaid Other | Admitting: Cardiology

## 2020-05-14 ENCOUNTER — Other Ambulatory Visit: Payer: Self-pay

## 2020-05-14 VITALS — BP 142/98 | HR 77 | Ht 64.0 in | Wt 232.0 lb

## 2020-05-14 DIAGNOSIS — R9431 Abnormal electrocardiogram [ECG] [EKG]: Secondary | ICD-10-CM | POA: Diagnosis not present

## 2020-05-14 DIAGNOSIS — I10 Essential (primary) hypertension: Secondary | ICD-10-CM | POA: Diagnosis not present

## 2020-05-14 DIAGNOSIS — R778 Other specified abnormalities of plasma proteins: Secondary | ICD-10-CM

## 2020-05-14 NOTE — Patient Instructions (Addendum)
Medication Instructions:   Your physician recommends that you continue on your current medications as directed. Please refer to the Current Medication list given to you today.  Labwork:  NONE  Testing/Procedures: Your physician has requested that you have an echocardiogram. Echocardiography is a painless test that uses sound waves to create images of your heart. It provides your doctor with information about the size and shape of your heart and how well your heart's chambers and valves are working. This procedure takes approximately one hour. There are no restrictions for this procedure.  Follow-Up:  Your physician recommends that you schedule a follow-up appointment in: pending.  Any Other Special Instructions Will Be Listed Below (If Applicable).  If you need a refill on your cardiac medications before your next appointment, please call your pharmacy. 

## 2020-05-14 NOTE — Progress Notes (Signed)
Clinical Summary Alyssa Pearson is a 34 y.o.female seen as a new patient today for the following medical problems.   1. Elevated troponin - seen in ER 04/28/20 with headache and diziness, N/V - a troponin was checked and mildly elevated at 47-->39. From record review I am not able to determine the context that led to the troponin lab being checked.  - EKG SR, nonspecific ST/T changes - in ER bp's 169/101 and then normalized.   - no chest pain. Sedentary lifestyle. Some DOE walking upstairs x 6 months. Rare mild LE edema   CAD risk factors: prediabetic, HTN, remote 7 year tobacco history.    2. HTN - diagnosed age 67 - has not taken meds yet today    Past Medical History:  Diagnosis Date  . Anemia   . Anxiety    h/o   . Arthritis    bil feet  . Depression    h/o  . GERD (gastroesophageal reflux disease)    h/o  . Headache    migraines  . Hypertension      No Known Allergies   Current Outpatient Medications  Medication Sig Dispense Refill  . acetaminophen (TYLENOL) 500 MG tablet Take 2 tablets (1,000 mg total) by mouth every 6 (six) hours as needed for moderate pain or headache. (Patient not taking: Reported on 01/23/2020) 100 tablet 0  . amLODipine (NORVASC) 5 MG tablet TAKE 1 TABLET BY MOUTH EVERY DAY 30 tablet 3  . ascorbic acid (VITAMIN C) 500 MG tablet Take 500 mg by mouth daily.    . Butalbital-APAP-Caffeine 50-325-40 MG capsule Take 1 capsule by mouth every 6 (six) hours as needed for headache. 30 capsule 0  . cyclobenzaprine (FLEXERIL) 10 MG tablet Take 1 tablet (10 mg total) by mouth at bedtime. (Patient not taking: Reported on 01/23/2020) 15 tablet 0  . escitalopram (LEXAPRO) 10 MG tablet Take 1 tablet (10 mg total) by mouth daily. After 1 week increase to 20 mg daily 60 tablet 3  . ibuprofen (ADVIL) 600 MG tablet Take 1 tablet (600 mg total) by mouth every 6 (six) hours as needed. (Patient not taking: Reported on 12/28/2019) 60 tablet 0  . metFORMIN  (GLUCOPHAGE) 500 MG tablet Take 1 tablet (500 mg total) by mouth 2 (two) times daily with a meal. Start 1 tablet daily then increase by 500mg  every week until 1000mg  BID 90 tablet 2  . ondansetron (ZOFRAN) 4 MG tablet Take 1 tablet (4 mg total) by mouth daily as needed for nausea or vomiting. 30 tablet 1  . Prenatal Vit-Fe Fumarate-FA (PRENATAL PO) Take 1 tablet by mouth daily.     . prochlorperazine (COMPAZINE) 10 MG tablet Take 1 tablet (10 mg total) by mouth every 8 (eight) hours as needed for nausea or vomiting (headache). (Patient not taking: Reported on 12/18/2019) 30 tablet 3  . simethicone (GAS-X) 80 MG chewable tablet Chew 1 tablet (80 mg total) by mouth every 6 (six) hours as needed for flatulence. (Patient not taking: Reported on 01/23/2020) 30 tablet 0   No current facility-administered medications for this visit.     Past Surgical History:  Procedure Laterality Date  . CHROMOPERTUBATION N/A 09/14/2019   Procedure: CHROMOPERTUBATION;  Surgeon: 01/25/2020, MD;  Location: [facility];  Service: Gynecology;  Laterality: N/A;  . DILATION AND EVACUATION N/A 12/19/2019   Procedure: DILATATION AND EVACUATION;  Surgeon: Natale Milch, MD;  Location: [facility];  Service: Gynecology;  Laterality: N/A;  .  HYSTEROSCOPY WITH D & C N/A 09/14/2019   Procedure: DILATATION AND CURETTAGE /HYSTEROSCOPY, POLYPECTOMY;  Surgeon: Homero Fellers, MD;  Location: [facility];  Service: Gynecology;  Laterality: N/A;  . LAPAROSCOPY N/A 09/14/2019   Procedure: LAPAROSCOPY DIAGNOSTIC;  Surgeon: Homero Fellers, MD;  Location: [facility];  Service: Gynecology;  Laterality: N/A;  . TUBAL LIGATION    . Tubal reversal  08/31/2017  . WISDOM TOOTH EXTRACTION       No Known Allergies    Family History  Problem Relation Age of Onset  . Hypertension Mother   . Diabetes Father   . Hypertension Father      Social History Ms. Mormino reports that she quit smoking about 10 years ago. Her  smoking use included cigarettes. She has a 3.50 pack-year smoking history. She has never used smokeless tobacco. Ms. Persaud reports no history of alcohol use.   Review of Systems CONSTITUTIONAL: No weight loss, fever, chills, weakness or fatigue.  HEENT: Eyes: No visual loss, blurred vision, double vision or yellow sclerae.No hearing loss, sneezing, congestion, runny nose or sore throat.  SKIN: No rash or itching.  CARDIOVASCULAR: perhpi RESPIRATORY: per hpi  GASTROINTESTINAL: No anorexia, nausea, vomiting or diarrhea. No abdominal pain or blood.  GENITOURINARY: No burning on urination, no polyuria NEUROLOGICAL: No headache, dizziness, syncope, paralysis, ataxia, numbness or tingling in the extremities. No change in bowel or bladder control.  MUSCULOSKELETAL: No muscle, back pain, joint pain or stiffness.  LYMPHATICS: No enlarged nodes. No history of splenectomy.  PSYCHIATRIC: No history of depression or anxiety.  ENDOCRINOLOGIC: No reports of sweating, cold or heat intolerance. No polyuria or polydipsia.  Marland Kitchen   Physical Examination Today's Vitals   05/14/20 1455  BP: (!) 142/98  Pulse: 77  SpO2: 98%  Weight: 232 lb (105.2 kg)  Height: 5\' 4"  (1.626 m)   Body mass index is 39.82 kg/m.  Gen: resting comfortably, no acute distress HEENT: no scleral icterus, pupils equal round and reactive, no palptable cervical adenopathy,  CV: RRR, no m/r/g, no jvd Resp: Clear to auscultation bilaterally GI: abdomen is soft, non-tender, non-distended, normal bowel sounds, no hepatosplenomegaly MSK: extremities are warm, no edema.  Skin: warm, no rash Neuro:  no focal deficits Psych: appropriate affect    Assessment and Plan  1. Elevated troponin - always difficult to interpret in setting of nonassociated clinical context - very mild elevation in setting of severe HTN on presentation, increased demand with N/V and systemic illness. If what she had was a migraine there also is some  association with coronary vasospasm that can occur. At this time no evidence to suggest obstructive coronary disease was the etiology - nonspecific ST/T changes on EKG, perhaps related to her long history of HTN and early LVH. Has some DOE at times, we will obtain an echo to further evaluate - if benign cardiac echo then no further workup   2. HTN - elevated today but has not taken meds, continue to monitor.  - HTN at young age, fairly mild as she is on low dose single agent. She reports previously improved with weight loss but reoccuring. I don't see a strong indication for a secondary HTN workup at this time despite the young age at onset.      Arnoldo Lenis, M.D.

## 2020-05-22 ENCOUNTER — Ambulatory Visit (INDEPENDENT_AMBULATORY_CARE_PROVIDER_SITE_OTHER): Payer: Medicaid Other

## 2020-05-22 ENCOUNTER — Other Ambulatory Visit: Payer: Self-pay

## 2020-05-22 DIAGNOSIS — R9431 Abnormal electrocardiogram [ECG] [EKG]: Secondary | ICD-10-CM

## 2020-06-18 ENCOUNTER — Other Ambulatory Visit: Payer: Self-pay

## 2020-06-18 ENCOUNTER — Encounter: Payer: Self-pay | Admitting: Obstetrics and Gynecology

## 2020-06-18 ENCOUNTER — Ambulatory Visit (INDEPENDENT_AMBULATORY_CARE_PROVIDER_SITE_OTHER): Payer: Medicaid Other | Admitting: Obstetrics and Gynecology

## 2020-06-18 VITALS — BP 132/88 | HR 80 | Ht 64.0 in | Wt 232.0 lb

## 2020-06-18 DIAGNOSIS — Z3169 Encounter for other general counseling and advice on procreation: Secondary | ICD-10-CM | POA: Diagnosis not present

## 2020-06-18 NOTE — Progress Notes (Signed)
Patient ID: Alyssa Pearson, female   DOB: 05/27/1986, 34 y.o.   MRN: [id number]  Reason for Consult: Consult (Discuss preconceptional )   Referred by Verdene Lennert, MD  Subjective:     HPI:  Alyssa Pearson is a 34 y.o. female.  She presents today for consultation regarding desire to conceive.  She had a miscarriage in January and since that time has been trying to get pregnant without success.  She reports that she is doing home ovulation kits and times her intercourse with her partner around positive ovulation results.  She is aware of trying to have every other day intercourse during the time of ovulation.  She reports that her menstrual cycle has been regular occurring every 26 to 28 days.  She previously has had a laparoscopy which confirmed tubal patency.  Her partner has not previously had a semen analysis.  He works as a Naval architect and is sometimes out of town.  She reports that his schedule has not prevented them from having intercourse around the time of ovulation thus far.  She does not smoke or drink alcohol.  She reports that her significant other does smoke and does drink alcohol.  She is taking Norvasc for blood pressure medication.  She reports she was recently diagnosed with vertigo.  Recent hemoglobin A1c was 6.0.   She reports that her depression and mood have significantly improved since her miscarriage.  She discontinued the antidepressant in April and has since been feeling well.    Past Medical History:  Diagnosis Date  . Anemia   . Anxiety    h/o   . Arthritis    bil feet  . Depression    h/o  . GERD (gastroesophageal reflux disease)    h/o  . Headache    migraines  . Hypertension    Family History  Problem Relation Age of Onset  . Hypertension Mother   . Diabetes Father   . Hypertension Father    Past Surgical History:  Procedure Laterality Date  . CHROMOPERTUBATION N/A 09/14/2019   Procedure: CHROMOPERTUBATION;  Surgeon: Natale Milch, MD;  Location: [facility];  Service: Gynecology;  Laterality: N/A;  . DILATION AND EVACUATION N/A 12/19/2019   Procedure: DILATATION AND EVACUATION;  Surgeon: Natale Milch, MD;  Location: [facility];  Service: Gynecology;  Laterality: N/A;  . HYSTEROSCOPY WITH D & C N/A 09/14/2019   Procedure: DILATATION AND CURETTAGE /HYSTEROSCOPY, POLYPECTOMY;  Surgeon: Natale Milch, MD;  Location: [facility];  Service: Gynecology;  Laterality: N/A;  . LAPAROSCOPY N/A 09/14/2019   Procedure: LAPAROSCOPY DIAGNOSTIC;  Surgeon: Natale Milch, MD;  Location: [facility];  Service: Gynecology;  Laterality: N/A;  . TUBAL LIGATION    . Tubal reversal  08/31/2017  . WISDOM TOOTH EXTRACTION      Short Social History:  Social History   Tobacco Use  . Smoking status: Former Smoker    Packs/day: 0.50    Years: 7.00    Pack years: 3.50    Types: Cigarettes    Quit date: 09/10/2009    Years since quitting: 10.7  . Smokeless tobacco: Never Used  Substance Use Topics  . Alcohol use: No    No Known Allergies  Current Outpatient Medications  Medication Sig Dispense Refill  . acetaminophen (TYLENOL) 500 MG tablet Take 2 tablets (1,000 mg total) by mouth every 6 (six) hours as needed for moderate pain or headache. 100 tablet 0  . amLODipine (NORVASC) 5 MG  tablet TAKE 1 TABLET BY MOUTH EVERY DAY 30 tablet 3  . ascorbic acid (VITAMIN C) 500 MG tablet Take 500 mg by mouth daily.    . meclizine (ANTIVERT) 25 MG tablet Take 25 mg by mouth as needed for dizziness.    . metFORMIN (GLUCOPHAGE) 500 MG tablet Take 1 tablet (500 mg total) by mouth 2 (two) times daily with a meal. Start 1 tablet daily then increase by 500mg  every week until 1000mg  BID 90 tablet 2  . Prenatal Vit-Fe Fumarate-FA (PRENATAL PO) Take 1 tablet by mouth daily.      No current facility-administered medications for this visit.    Review of Systems  Constitutional: Negative for chills, fatigue, fever and unexpected weight change.    HENT: Negative for trouble swallowing.  Eyes: Negative for loss of vision.  Respiratory: Negative for cough, shortness of breath and wheezing.  Cardiovascular: Negative for chest pain, leg swelling, palpitations and syncope.  GI: Negative for abdominal pain, blood in stool, diarrhea, nausea and vomiting.  GU: Negative for difficulty urinating, dysuria, frequency and hematuria.  Musculoskeletal: Negative for back pain, leg pain and joint pain.  Skin: Negative for rash.  Neurological: Negative for dizziness, headaches, light-headedness, numbness and seizures.  Psychiatric: Negative for behavioral problem, confusion, depressed mood and sleep disturbance.        Objective:  Objective   Vitals:   06/18/20 0816  BP: 132/88  Pulse: 80  Weight: 232 lb (105.2 kg)  Height: 5\' 4"  (1.626 m)   Body mass index is 39.82 kg/m.  Physical Exam Vitals and nursing note reviewed.  Constitutional:      Appearance: She is well-developed.  HENT:     Head: Normocephalic and atraumatic.  Eyes:     Pupils: Pupils are equal, round, and reactive to light.  Cardiovascular:     Rate and Rhythm: Normal rate and regular rhythm.  Pulmonary:     Effort: Pulmonary effort is normal. No respiratory distress.  Skin:    General: Skin is warm and dry.  Neurological:     Mental Status: She is alert and oriented to person, place, and time.  Psychiatric:        Behavior: Behavior normal.        Thought Content: Thought content normal.        Judgment: Judgment normal.        Assessment/Plan:     34 year old 06/20/20 1.  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  Discussed briefly with patient options for intrauterine insemination versus IVF.  Given that the patient is having regular cycles and a positive ovulation test we will not initiate Clomid at this time.  Discussed timing of intercourse.  Encourage smoking and  alcohol cessation with partner.  Continue taking prenatal vitamin.  Continue with healthy lifestyle changes.  Encouraged stress medication by exercise and therapy.   More than 15 minutes were spent face to face with the patient in the room, reviewing the medical record, labs and images, and coordinating care for the patient. The plan of management was discussed in detail and counseling was provided.    31 MD, [id number] OB/GYN, [facility] 06/18/2020 8:47 AM

## 2020-06-26 NOTE — Telephone Encounter (Signed)
Was sent to me accidentally.

## 2020-06-30 ENCOUNTER — Encounter (HOSPITAL_COMMUNITY): Payer: Self-pay | Admitting: Emergency Medicine

## 2020-06-30 ENCOUNTER — Emergency Department (HOSPITAL_COMMUNITY)
Admission: EM | Admit: 2020-06-30 | Discharge: 2020-06-30 | Disposition: A | Discharge status: Home / Self Care | Payer: Medicaid Other | Attending: Emergency Medicine | Admitting: Emergency Medicine

## 2020-06-30 ENCOUNTER — Other Ambulatory Visit: Payer: Self-pay

## 2020-06-30 DIAGNOSIS — W500XXA Accidental hit or strike by another person, initial encounter: Secondary | ICD-10-CM | POA: Insufficient documentation

## 2020-06-30 DIAGNOSIS — Y999 Unspecified external cause status: Secondary | ICD-10-CM | POA: Insufficient documentation

## 2020-06-30 DIAGNOSIS — H11422 Conjunctival edema, left eye: Secondary | ICD-10-CM

## 2020-06-30 DIAGNOSIS — H1132 Conjunctival hemorrhage, left eye: Secondary | ICD-10-CM | POA: Insufficient documentation

## 2020-06-30 DIAGNOSIS — Z79899 Other long term (current) drug therapy: Secondary | ICD-10-CM | POA: Diagnosis not present

## 2020-06-30 DIAGNOSIS — Y939 Activity, unspecified: Secondary | ICD-10-CM | POA: Diagnosis not present

## 2020-06-30 DIAGNOSIS — Z7984 Long term (current) use of oral hypoglycemic drugs: Secondary | ICD-10-CM | POA: Diagnosis not present

## 2020-06-30 DIAGNOSIS — Y92009 Unspecified place in unspecified non-institutional (private) residence as the place of occurrence of the external cause: Secondary | ICD-10-CM | POA: Diagnosis not present

## 2020-06-30 DIAGNOSIS — Z87891 Personal history of nicotine dependence: Secondary | ICD-10-CM | POA: Diagnosis not present

## 2020-06-30 DIAGNOSIS — R519 Headache, unspecified: Secondary | ICD-10-CM | POA: Insufficient documentation

## 2020-06-30 DIAGNOSIS — I10 Essential (primary) hypertension: Secondary | ICD-10-CM | POA: Insufficient documentation

## 2020-06-30 DIAGNOSIS — S0592XA Unspecified injury of left eye and orbit, initial encounter: Secondary | ICD-10-CM | POA: Diagnosis present

## 2020-06-30 HISTORY — DX: Dizziness and giddiness: R42

## 2020-06-30 MED ORDER — IBUPROFEN 800 MG PO TABS
800.0000 mg | ORAL_TABLET | Freq: Once | ORAL | Status: AC
  Administered 2020-06-30: 800 mg via ORAL
  Filled 2020-06-30: qty 1

## 2020-06-30 MED ORDER — HYDROCODONE-ACETAMINOPHEN 5-325 MG PO TABS: 1.0000 | ORAL_TABLET | ORAL | 0 refills | Status: DC | PRN

## 2020-06-30 MED ORDER — TETRACAINE HCL 0.5 % OP SOLN
2.0000 [drp] | Freq: Once | OPHTHALMIC | Status: AC
  Administered 2020-06-30: 2 [drp] via OPHTHALMIC
  Filled 2020-06-30: qty 4

## 2020-06-30 MED ORDER — HYDROCODONE-ACETAMINOPHEN 5-325 MG PO TABS
1.0000 | ORAL_TABLET | Freq: Once | ORAL | Status: AC
  Administered 2020-06-30: 1 via ORAL
  Filled 2020-06-30: qty 1

## 2020-06-30 MED ORDER — FLUORESCEIN SODIUM 1 MG OP STRP
1.0000 | ORAL_STRIP | Freq: Once | OPHTHALMIC | Status: AC
  Administered 2020-06-30: 1 via OPHTHALMIC
  Filled 2020-06-30: qty 1

## 2020-06-30 NOTE — ED Triage Notes (Signed)
Patient c/o injury to left eye. Per patient dropped phone onto face last night while laying in bed. Subconjunctival hemorrhage to left eye noted. Patient also reports headache and blurred vision.

## 2020-06-30 NOTE — ED Provider Notes (Signed)
[facility] EMERGENCY DEPARTMENT Provider Note   CSN: [id number] Arrival date & time: 06/30/20  1719     History Chief Complaint  Patient presents with   Eye Injury    Alyssa Pearson is a 34 y.o. female.  Pt presents to the ED today with left eye pain.  She said she dropped her phone on her left eye last night around midnight.  She said she has some redness to her eye and a headache.  Slight blurriness to her vision.  She wears glasses.  No contacts.          Past Medical History:  Diagnosis Date   Anemia    Anxiety    h/o    Arthritis    bil feet   Depression    h/o   GERD (gastroesophageal reflux disease)    h/o   Headache    migraines   Hypertension    Vertigo     Patient Active Problem List   Diagnosis Date Noted   Nausea and vomiting in adult 04/30/2020   Elevated troponin 04/30/2020   Depression 04/30/2020   History of reversal of tubal ligation 09/17/2017    Past Surgical History:  Procedure Laterality Date   CHROMOPERTUBATION N/A 09/14/2019   Procedure: CHROMOPERTUBATION;  Surgeon: Natale Milch, MD;  Location: [facility] ORS;  Service: Gynecology;  Laterality: N/A;   DILATION AND EVACUATION N/A 12/19/2019   Procedure: DILATATION AND EVACUATION;  Surgeon: Natale Milch, MD;  Location: [facility] ORS;  Service: Gynecology;  Laterality: N/A;   HYSTEROSCOPY WITH D & C N/A 09/14/2019   Procedure: DILATATION AND CURETTAGE /HYSTEROSCOPY, POLYPECTOMY;  Surgeon: Natale Milch, MD;  Location: [facility] ORS;  Service: Gynecology;  Laterality: N/A;   LAPAROSCOPY N/A 09/14/2019   Procedure: LAPAROSCOPY DIAGNOSTIC;  Surgeon: Natale Milch, MD;  Location: [facility] ORS;  Service: Gynecology;  Laterality: N/A;   TUBAL LIGATION     Tubal reversal  08/31/2017   WISDOM TOOTH EXTRACTION       OB History    Gravida  6   Para  3   Term  3   Preterm      AB  2   Living  3     SAB  2   TAB      Ectopic      Multiple       Live Births  3           Family History  Problem Relation Age of Onset   Hypertension Mother    Diabetes Father    Hypertension Father     Social History   Tobacco Use   Smoking status: Former Smoker    Packs/day: 0.50    Years: 7.00    Pack years: 3.50    Types: Cigarettes    Quit date: 09/10/2009    Years since quitting: 10.8   Smokeless tobacco: Never Used  Vaping Use   Vaping Use: Never used  Substance Use Topics   Alcohol use: No   Drug use: No    Home Medications Prior to Admission medications   Medication Sig Start Date End Date Taking? Authorizing Provider  acetaminophen (TYLENOL) 500 MG tablet Take 2 tablets (1,000 mg total) by mouth every 6 (six) hours as needed for moderate pain or headache. 12/19/19   Schuman, Christanna R, MD  amLODipine (NORVASC) 5 MG tablet TAKE 1 TABLET BY MOUTH EVERY DAY 02/28/20   Schuman, Jaquelyn Bitter, MD  ascorbic acid (VITAMIN C)  500 MG tablet Take 500 mg by mouth daily.    [provider]  meclizine (ANTIVERT) 25 MG tablet Take 25 mg by mouth as needed for dizziness.    [provider]  metFORMIN (GLUCOPHAGE) 500 MG tablet Take 1 tablet (500 mg total) by mouth 2 (two) times daily with a meal. Start 1 tablet daily then increase by 500mg  every week until 1000mg  BID 05/02/20 05/02/21  05/04/20, MD  Prenatal Vit-Fe Fumarate-FA (PRENATAL PO) Take 1 tablet by mouth daily.     [provider]    Allergies    Patient has no known allergies.  Review of Systems   Review of Systems  Eyes: Positive for photophobia, pain and redness.  All other systems reviewed and are negative.   Physical Exam Updated Vital Signs BP (!) 133/87 (BP Location: Right Arm)    Pulse 80    Temp 98.8 F (37.1 C) (Oral)    Resp 16    Ht 5\' 4"  (1.626 m)    Wt (!) 102.1 kg    LMP 06/27/2020    SpO2 100%    BMI 38.62 kg/m   Physical Exam Vitals and nursing note reviewed.  Constitutional:      Appearance: Normal  appearance.  HENT:     Head: Normocephalic and atraumatic.     Right Ear: External ear normal.     Left Ear: External ear normal.     Nose: Nose normal.     Mouth/Throat:     Mouth: Mucous membranes are moist.     Pharynx: Oropharynx is clear.  Eyes:     Conjunctiva/sclera:     Left eye: Chemosis and hemorrhage present.     Pupils:     Left eye: No corneal abrasion or fluorescein uptake.  Cardiovascular:     Rate and Rhythm: Normal rate and regular rhythm.     Pulses: Normal pulses.     Heart sounds: Normal heart sounds.  Pulmonary:     Effort: Pulmonary effort is normal.     Breath sounds: Normal breath sounds.  Abdominal:     General: Abdomen is flat. Bowel sounds are normal.     Palpations: Abdomen is soft.  Musculoskeletal:        General: Normal range of motion.     Cervical back: Normal range of motion and neck supple.  Skin:    General: Skin is warm.     Capillary Refill: Capillary refill takes less than 2 seconds.  Neurological:     General: No focal deficit present.     Mental Status: She is alert and oriented to person, place, and time.  Psychiatric:        Mood and Affect: Mood normal.        Behavior: Behavior normal.     ED Results / Procedures / Treatments   Labs (all labs ordered are listed, but only abnormal results are displayed) Labs Reviewed - No data to display  EKG None  Radiology No results found.  Procedures Procedures (including critical care time)  Medications Ordered in ED Medications  fluorescein ophthalmic strip 1 strip (has no administration in time range)  tetracaine (PONTOCAINE) 0.5 % ophthalmic solution 2 drop (has no administration in time range)  HYDROcodone-acetaminophen (NORCO/VICODIN) 5-325 MG per tablet 1 tablet (has no administration in time range)  ibuprofen (ADVIL) tablet 800 mg (has no administration in time range)    ED Course  I have reviewed the triage vital signs and  the nursing notes.  Pertinent labs &  imaging results that were available during my care of the patient were reviewed by me and considered in my medical decision making (see chart for details).    MDM Rules/Calculators/A&P                             Visual Acuity  Right Eye Distance:   Left Eye Distance:   Bilateral Distance:    Right Eye Near: R Near: 20/20 Left Eye Near:  L Near: 20/40 Bilateral Near:  20/15 (pt without her glasses on)   Pt with subconjunctival hemorrhage and chemosis.  She is to f/u with ophtho.  Return if worse.    Final Clinical Impression(s) / ED Diagnoses Final diagnoses:  Subconjunctival hemorrhage of left eye    Rx / DC Orders ED Discharge Orders    None       Jacalyn Lefevre, MD 06/30/20 [phone]

## 2020-07-01 ENCOUNTER — Telehealth: Payer: Self-pay | Admitting: *Deleted

## 2020-07-01 NOTE — Telephone Encounter (Signed)
Contacted pt  Transition Care Management Follow-up Telephone Call  . Medicaid Managed Care Transition Call Status:MM [facility] Call Made  . Date of discharge and from where: [facility], 06/30/20  . How have you been since you were released from the hospital? Still having pain; eye barely open; pt states she just got her medication  . Any questions or concerns? no  Items Reviewed: Marland Kitchen Did the pt receive and understand the discharge instructions provided? Yes  . Medications obtained and verified? Yes  . Any new allergies since your discharge? No  . Dietary orders reviewed? Yes . Do you have support at home? Yes, family  Functional Questionnaire: (I = Independent and D = Dependent)  ADLs: Independent Bathing/Dressing:Independent Meal Prep: Independent Eating: Independent Maintaining continence: Independent Transferring/Ambulation: Independent Managing Meds: Independent Follow up appointments reviewed:  PCP Hospital f/u appt confirmed? Yes  Scheduled to see Dr Huel Cote on 07/08/20 @ 1445. Specialist Hospital f/u appt confirmed? No pt to call Dr Elberta Spaniel, on 07/01/20 to schedule follow up appt  Are transportation arrangements needed? No   If their condition worsens, is the pt aware to call PCP or go to the EmergencyDept.yes Was the patient provided with contact information for the PCP's office or ED? yes  Was to pt encouraged to call back with questions or concerns? Yes  Burnard Bunting, RN, BSN, CCRN [facility] [phone]

## 2020-07-08 ENCOUNTER — Encounter: Payer: Self-pay | Admitting: Internal Medicine

## 2020-07-08 ENCOUNTER — Ambulatory Visit: Payer: Medicaid Other | Admitting: Internal Medicine

## 2020-07-08 DIAGNOSIS — H00014 Hordeolum externum left upper eyelid: Secondary | ICD-10-CM | POA: Diagnosis not present

## 2020-07-08 DIAGNOSIS — M79645 Pain in left finger(s): Secondary | ICD-10-CM | POA: Diagnosis not present

## 2020-07-08 DIAGNOSIS — R7303 Prediabetes: Secondary | ICD-10-CM

## 2020-07-08 MED ORDER — MELOXICAM 7.5 MG PO TABS
15.0000 mg | ORAL_TABLET | Freq: Every day | ORAL | 0 refills | Status: AC
Start: 2020-07-08 — End: 2020-07-22

## 2020-07-08 MED ORDER — METFORMIN HCL ER 500 MG PO TB24: 500.0000 mg | ORAL_TABLET | Freq: Every day | ORAL | 11 refills | Status: DC

## 2020-07-08 NOTE — Patient Instructions (Signed)
It was nice seeing you today! Thank you for choosing [facility] for your Primary Care.    Today we talked about:   1. Pre-diabetes: I sent in a prescription for a long-acting Metformin, which may be easier on your stomach.   2. Eye stye: Contact the ophthalmologist's office for possible drainage   3. Finger pain: Try Mobic 2 tablets daily for two weeks, as well as buddy taping your finger to give it a full rest. If no improvement afterwards, please send me a MyChart message or call the clinic for a referral to sports medicine

## 2020-07-09 NOTE — Progress Notes (Addendum)
   CC: pre-diabetes follow up and finger pain  HPI:  [name] is a 34 y.o. with a PMHx of HTN, vertigo, pre-diabetes who presents to the clinic for pre-diabetes follow up and finger pain.   Please see the Encounters tab for problem-based Assessment & Plan regarding status of patient's acute and chronic conditions.  Past Medical History:  Diagnosis Date  . Anemia   . Anxiety    h/o   . Arthritis    bil feet  . Depression    h/o  . GERD (gastroesophageal reflux disease)    h/o  . Headache    migraines  . Hypertension   . Vertigo    Review of Systems: Review of Systems  Constitutional: Negative for chills and fever.  Eyes:       Bump on left eyelid  Gastrointestinal: Positive for abdominal pain, diarrhea and nausea.  Musculoskeletal: Positive for joint pain (left 2nd digit). Negative for falls.  Neurological: Negative for dizziness, focal weakness, weakness and headaches.   Physical Exam:  Vitals:   07/08/20 1519  BP: 132/78  Pulse: 88  Temp: 98.7 F (37.1 C)  TempSrc: Oral  SpO2: 99%  Weight: 231 lb 8 oz (105 kg)  Height: 5\' 4"  (1.626 m)   Physical Exam Vitals and nursing note reviewed.  Constitutional:      General: She is not in acute distress.    Appearance: She is normal weight.  Eyes:   Cardiovascular:     Rate and Rhythm: Normal rate and regular rhythm.     Heart sounds: No murmur heard.   Pulmonary:     Effort: Pulmonary effort is normal. No respiratory distress.     Breath sounds: No wheezing or rales.  Musculoskeletal:     Comments: Left 2nd digit: Inability to touch finger to palm. ROM limited by pain on flexion at the DIP and PIP joints. No swelling. No bony tenderness on palpation.   Skin:    General: Skin is warm and dry.  Neurological:     General: No focal deficit present.     Mental Status: She is alert and oriented to person, place, and time.  Psychiatric:        Mood and Affect: Mood normal.        Behavior: Behavior  normal.    Assessment & Plan:   See Encounters Tab for problem based charting.  Patient discussed with Dr. 

## 2020-07-10 DIAGNOSIS — M79645 Pain in left finger(s): Secondary | ICD-10-CM | POA: Insufficient documentation

## 2020-07-10 DIAGNOSIS — R7303 Prediabetes: Secondary | ICD-10-CM | POA: Insufficient documentation

## 2020-07-10 DIAGNOSIS — H00019 Hordeolum externum unspecified eye, unspecified eyelid: Secondary | ICD-10-CM | POA: Insufficient documentation

## 2020-07-10 NOTE — Progress Notes (Signed)
Internal Medicine Clinic Attending  Case discussed with Dr. Basaraba  At the time of the visit.  We reviewed the resident's history and exam and pertinent patient test results.  I agree with the assessment, diagnosis, and plan of care documented in the resident's note.  

## 2020-07-10 NOTE — Assessment & Plan Note (Signed)
Patient states that she has been having a stye on her left eye for several weeks now that seems to come and go.  She has tried conservative treatment including warm compresses, however stye reaccumulate's on cessation.  She denies any pain to her eye or difficulty with eye motion.  Assessment/plan: No evidence of acute infection. Encouraged patient to contact her ophthalmologist office for possible drainage.

## 2020-07-10 NOTE — Assessment & Plan Note (Signed)
Ms. Riester endorses a 36-month history of left pointer finger pain that is worst when she is attempting to flex to close her fist.  She denies any trauma to the area at onset.  She denies any swelling, redness that she has noticed.  Alleviating factors include resting and exacerbating factors include any motion that involves flexion of the finger.  She is not endorsing any pain with just palpation.  She denies pain in any other part of her hands, wrists or arm.  Assessment/plan: Differential includes flexor tendinitis. Unlikely to be a fracture, as she did not have any trauma to the region.  Due to this, imaging is unlikely to be helpful.  We will start with conservative measurements including scheduled NSAIDs and buddy taping and reevaluate in 1 month.  If no improvement at that time, consider referral to sports medicine for possible injections.  -Meloxicam 15 mg once per day for 2 weeks -Buddy taping

## 2020-07-10 NOTE — Assessment & Plan Note (Signed)
Lab Results  Component Value Date   HGBA1C 6.0 (H) 04/30/2020   [name] states that she was placed on metformin after her last office visit when her A1c was elevated at 6.0.  She has been taking it once per day and has attempted to increase dose.  She notes that even with the once per day dosing, she has significant nausea, diarrhea, abdominal cramping that prevents her from being able to take the second dose in the afternoon.  Assessment/plan: Patient placed on Metformin for prevention of progression to diabetes from a prediabetic state.  She is having significant side effects from the Metformin but is willing to try for 1 more month.  We will switch to extended release tablet, as this may decrease her side effects.  If no improvement in abdominal symptoms, will discontinue and work on dietary changes only.  -Metformin 500 mg XR -1 month follow-up

## 2020-07-17 ENCOUNTER — Other Ambulatory Visit: Payer: Self-pay | Admitting: Internal Medicine

## 2020-07-17 ENCOUNTER — Other Ambulatory Visit: Payer: Self-pay | Admitting: Obstetrics and Gynecology

## 2020-07-17 DIAGNOSIS — I10 Essential (primary) hypertension: Secondary | ICD-10-CM

## 2020-07-17 DIAGNOSIS — R7303 Prediabetes: Secondary | ICD-10-CM

## 2020-07-17 NOTE — Telephone Encounter (Signed)
Will wait to refill till after follow up OV with me on the 30th

## 2020-08-05 ENCOUNTER — Ambulatory Visit (INDEPENDENT_AMBULATORY_CARE_PROVIDER_SITE_OTHER): Payer: Medicaid Other | Admitting: Internal Medicine

## 2020-08-05 ENCOUNTER — Encounter: Payer: Self-pay | Admitting: Internal Medicine

## 2020-08-05 ENCOUNTER — Other Ambulatory Visit: Payer: Self-pay

## 2020-08-05 VITALS — BP 132/79 | HR 89 | Temp 98.4°F | Ht 64.0 in | Wt 228.3 lb

## 2020-08-05 DIAGNOSIS — I10 Essential (primary) hypertension: Secondary | ICD-10-CM

## 2020-08-05 DIAGNOSIS — M79645 Pain in left finger(s): Secondary | ICD-10-CM | POA: Diagnosis not present

## 2020-08-05 DIAGNOSIS — R7303 Prediabetes: Secondary | ICD-10-CM

## 2020-08-05 LAB — POCT GLYCOSYLATED HEMOGLOBIN (HGB A1C): Hemoglobin A1C: 6 % — AB (ref 4.0–5.6)

## 2020-08-05 LAB — GLUCOSE, CAPILLARY: Glucose-Capillary: 110 mg/dL — ABNORMAL HIGH (ref 70–99)

## 2020-08-05 NOTE — Progress Notes (Signed)
   CC: Pre-diabetes; finger pain follow up  HPI:  [name] is a 34 y.o. with a PMHx as listed below who presents to the clinic for pre-diabetes; finger pain follow up.   Please see the Encounters tab for problem-based Assessment & Plan regarding status of patient's acute and chronic conditions.  Past Medical History:  Diagnosis Date  . Anemia   . Anxiety    h/o   . Arthritis    bil feet  . Depression    h/o  . GERD (gastroesophageal reflux disease)    h/o  . Headache    migraines  . Hypertension   . Vertigo    Review of Systems: Review of Systems  Constitutional: Negative for chills and fever.  Gastrointestinal: Negative for abdominal pain, diarrhea, nausea and vomiting.  Musculoskeletal: Negative for falls.       + finger pain  Neurological: Negative for dizziness and headaches.   Physical Exam:  Vitals:   08/05/20 1500  BP: 132/79  Pulse: 89  Temp: 98.4 F (36.9 C)  TempSrc: Oral  SpO2: 100%  Weight: 228 lb 4.8 oz (103.6 kg)  Height: 5\' 4"  (1.626 m)    Physical Exam Vitals and nursing note reviewed.  Constitutional:      General: She is not in acute distress.    Appearance: She is normal weight.  Cardiovascular:     Rate and Rhythm: Normal rate and regular rhythm.     Heart sounds: No murmur heard.  No gallop.   Musculoskeletal:     Comments: Left pointer finger:  No gross abnormality on inspection.  She has tenderness when hand is in a fist, and pointer digit is forcibly flexed.  She has no tenderness to palpation overlying the DIP or PIP.  No edema or erythema.  The distal tip of the pointer finger is limited in full flexion, but no difficulty with extension.  Skin:    General: Skin is warm and dry.  Neurological:     General: No focal deficit present.     Mental Status: She is alert and oriented to person, place, and time. Mental status is at baseline.  Psychiatric:        Mood and Affect: Mood normal.        Behavior: Behavior  normal.    Assessment & Plan:   See Encounters Tab for problem based charting.  Patient seen with Dr. 

## 2020-08-05 NOTE — Patient Instructions (Addendum)
It was nice seeing you today! Thank you for choosing [facility] for your Primary Care.    Today we talked about:   1. Pre-Diabetes: I am so happy to hear the long acting Metformin is working better for you. Continue taking this daily with breakfast.   2. Blood Pressure: Since you are doing so well with weight loss, we can hold off on adjusting your medication. Continue taking Amlodipine 5 mg   3. Finger Pain: This may be secondary to "Mallet Finger." I have sent in a referral to the hand surgery to get an expert opinion.

## 2020-08-05 NOTE — Assessment & Plan Note (Signed)
Lab Results  Component Value Date   HGBA1C 6.0 (A) 08/05/2020   Alyssa Pearson states she is tolerating the long-acting Metformin much better, is no longer having any nausea, vomiting, diarrhea.  She is actively working on losing weight, by exercising daily.  She states last week she was able to walk 8-1/2 miles.  Assessment/plan: A1c stable at this time.  Discussed with patient that she has the option to either continue with the Metformin or to discontinue and monitor carefully.  She prefers to continue with medication management at this time.  -Metformin 500 mg XR -A1c in 3 months

## 2020-08-05 NOTE — Assessment & Plan Note (Signed)
Ms. Juhasz continues to have left pointer finger pain.  She tried the Mobic and buddy taping without significant relief.  Pain has not changed in severity or quality.  She states she still cannot identify any triggering events, but notes she has an active household with kids and dogs, and may have bumped it at some point.  Assessment/plan: On reevaluation today, differential to also include mallet finger.  Given patient's continued symptoms and lack of response to conservative management, will place referral to hand surgery for further evaluation.  -Referral to hand surgery

## 2020-08-05 NOTE — Assessment & Plan Note (Signed)
Alyssa Pearson states she is having no difficulty with amlodipine 5 mg daily.  She denies any chest pain or shortness of breath.  She is able to walk several miles per day  Assessment/plan: No medication changes made today.  I suspect patient's blood pressure will continue to improve as she continues with her weight loss journey.  -Amlodipine 5 mg

## 2020-08-07 NOTE — Progress Notes (Signed)
Internal Medicine Clinic Attending  I saw and evaluated the patient.  I personally confirmed the key portions of the history and exam documented by Dr. Basaraba and I reviewed pertinent patient test results.  The assessment, diagnosis, and plan were formulated together and I agree with the documentation in the resident's note.    

## 2020-08-20 ENCOUNTER — Telehealth: Payer: Self-pay | Admitting: *Deleted

## 2020-08-20 NOTE — Telephone Encounter (Signed)
CALLED PATIENT, UNABLE TO LEAVE MESSAGE. VOICE MAIL IS FULL. ORHTOPEDIC OFFICE UNABLE TO GET IN CONTACT WITH PATIENT DUE TO VOICE MAIL BEING FULL.Marland Kitchen

## 2020-08-21 ENCOUNTER — Telehealth: Payer: Self-pay | Admitting: *Deleted

## 2020-08-21 NOTE — Telephone Encounter (Signed)
UNABLE TO CONTACT PATIENT -PHONE FULL. MOM NUMBER NO VM / SPOKE WITH BRANDON LEFT MESSAGE WITH HIM TO HAVE HER CALL THE ORTHO OFFICE ( [phone]) FOR HER HAND. BOTH OFFICES HAVE BEEN UNABLE TO GET IN CONTACT WITH HER DUE TO HER PHONE BEING FULL.

## 2020-08-23 ENCOUNTER — Other Ambulatory Visit: Payer: Medicaid Other

## 2020-08-23 ENCOUNTER — Other Ambulatory Visit: Payer: Self-pay | Admitting: Obstetrics and Gynecology

## 2020-08-23 ENCOUNTER — Other Ambulatory Visit: Payer: Self-pay

## 2020-08-23 DIAGNOSIS — O209 Hemorrhage in early pregnancy, unspecified: Secondary | ICD-10-CM | POA: Diagnosis not present

## 2020-08-23 DIAGNOSIS — Z9889 Other specified postprocedural states: Secondary | ICD-10-CM

## 2020-08-23 DIAGNOSIS — O3680X0 Pregnancy with inconclusive fetal viability, not applicable or unspecified: Secondary | ICD-10-CM

## 2020-08-23 NOTE — Telephone Encounter (Signed)
Needs on of the two following A) labs today and Tuesday B) labs Monday or and Wednesday  I have put in the lab orders depends on patient ability to come in.  Then follow up Wednesday for option A, Thursday for option B

## 2020-08-24 LAB — BETA HCG QUANT (REF LAB): hCG Quant: <1 m[IU]/mL

## 2020-08-26 ENCOUNTER — Ambulatory Visit: Payer: Medicaid Other | Admitting: Obstetrics and Gynecology

## 2020-08-27 ENCOUNTER — Other Ambulatory Visit: Payer: Medicaid Other

## 2020-08-29 DIAGNOSIS — M79645 Pain in left finger(s): Secondary | ICD-10-CM | POA: Diagnosis not present

## 2020-09-18 ENCOUNTER — Ambulatory Visit (INDEPENDENT_AMBULATORY_CARE_PROVIDER_SITE_OTHER): Payer: Medicaid Other | Admitting: Internal Medicine

## 2020-09-18 ENCOUNTER — Other Ambulatory Visit: Payer: Self-pay

## 2020-09-18 DIAGNOSIS — R112 Nausea with vomiting, unspecified: Secondary | ICD-10-CM

## 2020-09-18 MED ORDER — ONDANSETRON 4 MG PO TBDP: 4.0000 mg | ORAL_TABLET | Freq: Three times a day (TID) | ORAL | 0 refills | Status: DC | PRN

## 2020-09-18 NOTE — Progress Notes (Signed)
Internal Medicine Clinic Attending  Case discussed with Dr. Aslam at the time of the visit.  We reviewed the resident's history and exam and pertinent patient test results.  I agree with the assessment, diagnosis, and plan of care documented in the resident's note.  Tiandra Swoveland, M.D., Ph.D.  

## 2020-09-18 NOTE — Assessment & Plan Note (Signed)
Ms. Efrata Brunner is being evaluated via telehealth for nausea and vomiting. Patient endorses that she was in her usual state of health until Saturday evening. She notes she had chicken fajita with vegetables and guacamole from a [employer] on Saturday. Following this, she had three episodes of non-bloody non-bilious emesis that resolved that evening. However, she has had persistent nausea following these episodes. She endorses that this is present regardless of her oral intake - she has tried fruits, salads, and chips without any significant change in her nausea. She has also been drinking ginger ale and lemon water without any relief. She notes prior history of acid reflux; but endorses 20lb weight loss and notes that her current symptoms do not correlate with her prior reflux. She denies any fevers/chills, headaches, lightheadedness/dizziness, abdominal pain or diarrhea/constipation.  Of note, patient's LMP was on 9/18 and she is sexually active without use of contraception.  She also has not received the COVID vaccine yet but denies any recent sick contacts. At this time, patient advised to get tested for COVID and also get pregnancy test. I will send zofran for nausea. If her symptoms are persistent and testing for COVID and pregnancy negative, will have her further evaluated in clinic.  Plan: COVID and pregnancy testing Zofran 4mg  odt q8h prn nausea

## 2020-09-18 NOTE — Progress Notes (Signed)
   CC: nausea/vomiting  This is a telephone encounter between [name] and [name] on 09/18/2020 for nausea/vomiting. The visit was conducted with the patient located at home and [place] at [facility]. The patient's identity was confirmed using their DOB and current address. The patient has consented to being evaluated through a telephone encounter and understands the associated risks (an examination cannot be done and the patient may need to come in for an appointment) / benefits (allows the patient to remain at home, decreasing exposure to coronavirus). I personally spent 10 minutes on medical discussion.   HPI:  [name] is a 34 y.o. with PMH as below.   Please see A&P for assessment of the patient's acute and chronic medical conditions.    Past Medical History:  Diagnosis Date  . Anemia   . Anxiety    h/o   . Arthritis    bil feet  . Depression    h/o  . GERD (gastroesophageal reflux disease)    h/o  . Headache    migraines  . Hypertension   . Vertigo    Review of Systems:  Negative except as stated in HPI.    Assessment & Plan:   See Encounters Tab for problem based charting.  Patient discussed with Dr. Sandre Kitty

## 2020-09-24 DIAGNOSIS — M79645 Pain in left finger(s): Secondary | ICD-10-CM | POA: Diagnosis not present

## 2020-10-08 DIAGNOSIS — H5213 Myopia, bilateral: Secondary | ICD-10-CM | POA: Diagnosis not present

## 2020-10-24 DIAGNOSIS — M79645 Pain in left finger(s): Secondary | ICD-10-CM | POA: Diagnosis not present

## 2020-11-15 NOTE — Telephone Encounter (Signed)
Pt calling; wants order for blood preg test.  [phone]

## 2020-11-18 ENCOUNTER — Other Ambulatory Visit: Payer: Self-pay | Admitting: Obstetrics and Gynecology

## 2020-11-18 DIAGNOSIS — O3680X0 Pregnancy with inconclusive fetal viability, not applicable or unspecified: Secondary | ICD-10-CM

## 2020-11-18 NOTE — Telephone Encounter (Signed)
Patient is scheduled for lab 11/19/20 for lab and 11/21/20 at 3:30 with [facility]

## 2020-11-18 NOTE — Telephone Encounter (Signed)
This patient needs a visit to be seen this week and to be added on for a lab visit today or tomorrow. Thank you.

## 2020-11-19 ENCOUNTER — Other Ambulatory Visit: Payer: Medicaid Other

## 2020-11-19 ENCOUNTER — Other Ambulatory Visit: Payer: Self-pay

## 2020-11-19 DIAGNOSIS — O3680X0 Pregnancy with inconclusive fetal viability, not applicable or unspecified: Secondary | ICD-10-CM | POA: Diagnosis not present

## 2020-11-20 LAB — BETA HCG QUANT (REF LAB): hCG Quant: <1 m[IU]/mL

## 2020-11-21 ENCOUNTER — Encounter: Payer: Medicaid Other | Admitting: Obstetrics and Gynecology

## 2020-11-27 IMAGING — CT CT HEAD W/O CM
3 of 4 series · 14 of 47 positions shown, 16 images · non-contrast
Comparison: None.

CLINICAL DATA: Dizziness, headache, vomiting

EXAM:
CT HEAD WITHOUT CONTRAST
TECHNIQUE: Contiguous axial images were obtained from the base of the skull
through the vertex without intravenous contrast.

[Series 4: coronal soft tissue · coronal · 0.33mm/px · 3 of 67 slices shown]
[im 23/67  brain]
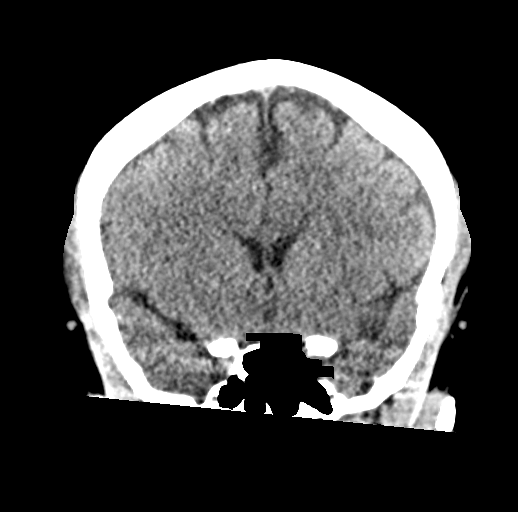
[im 30/67  brain]
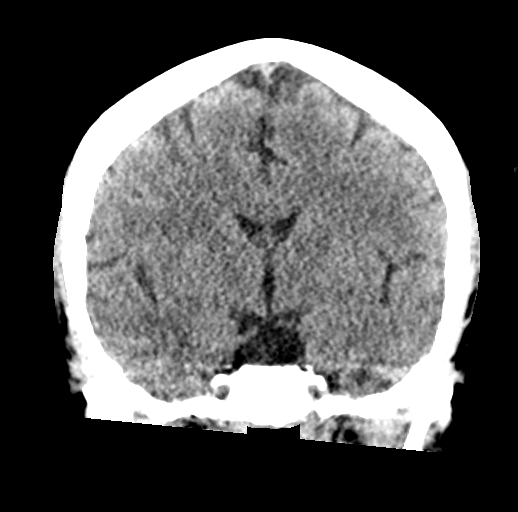
[im 37/67  brain]
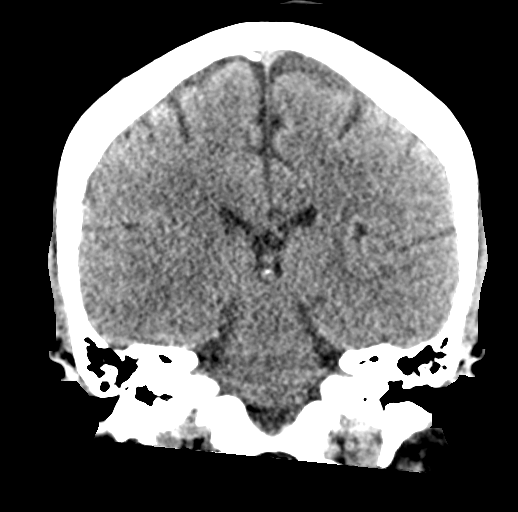

[Series 5: sagittal soft tissue · sagittal · 0.33mm/px · 3 of 50 slices shown]
[im 17/50  brain]
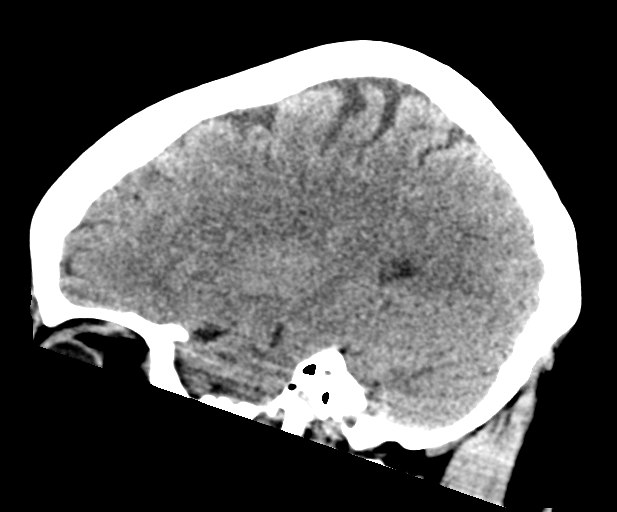
[im 25/50  brain]
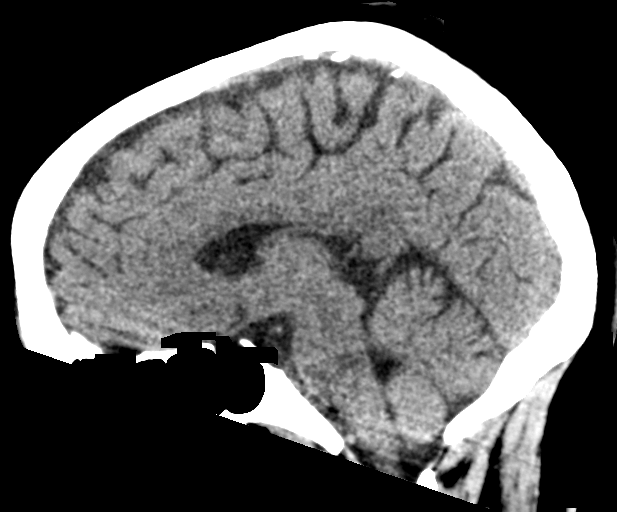
[im 33/50  brain]
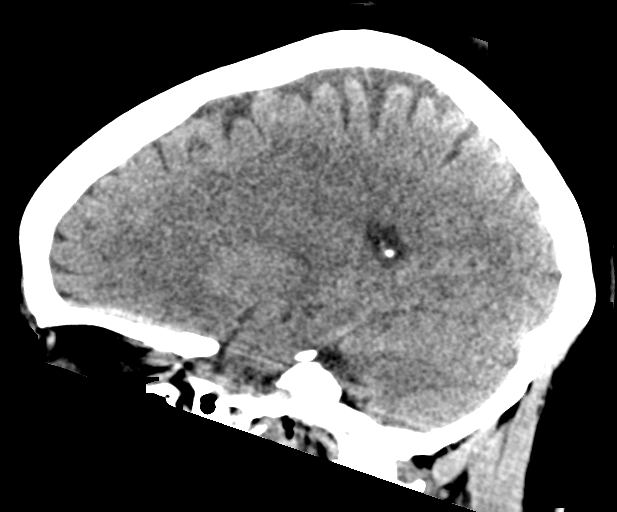

[Series 6: ax head wo · axial · 0.35mm/px · z∈[-141,-9]mm · 8 of 34 slices shown, 10 images]
[im 3/34  brain]
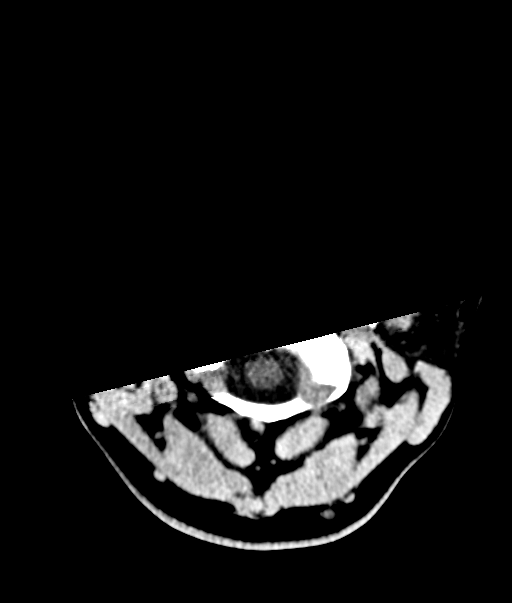
[im 3/34  bone]
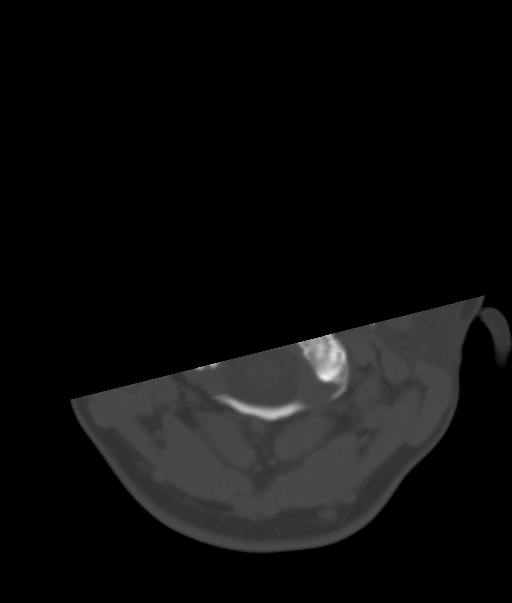
[im 7/34  brain]
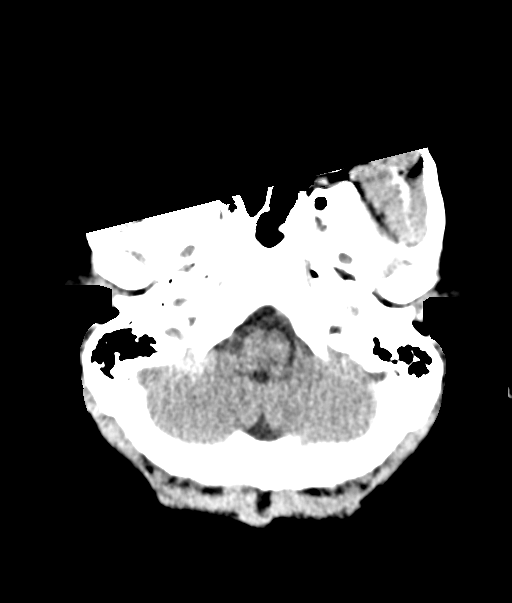
[im 12/34  brain]
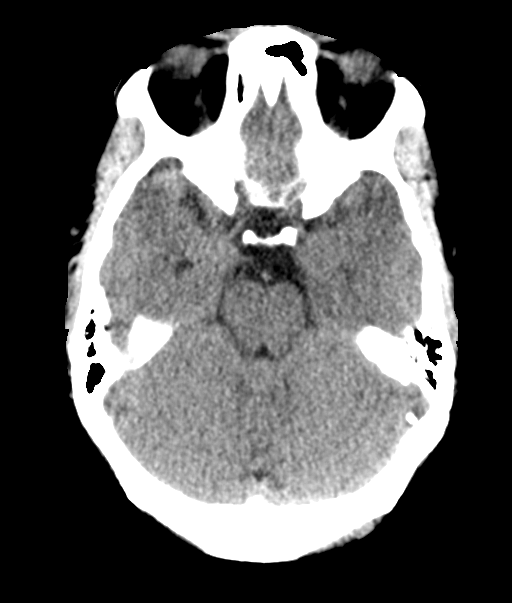
[im 16/34  brain]
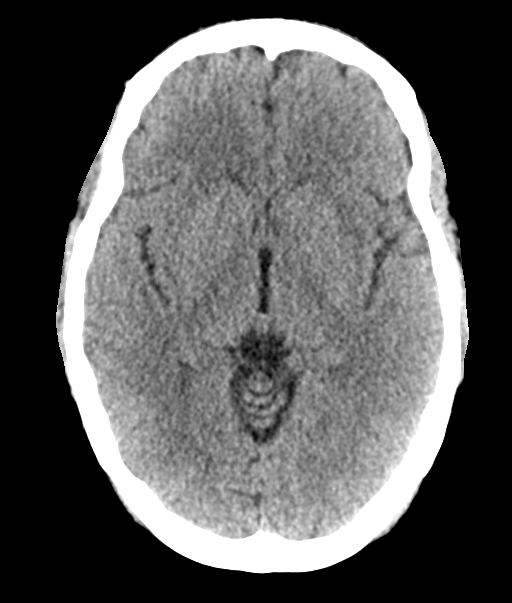
[im 18/34  brain]
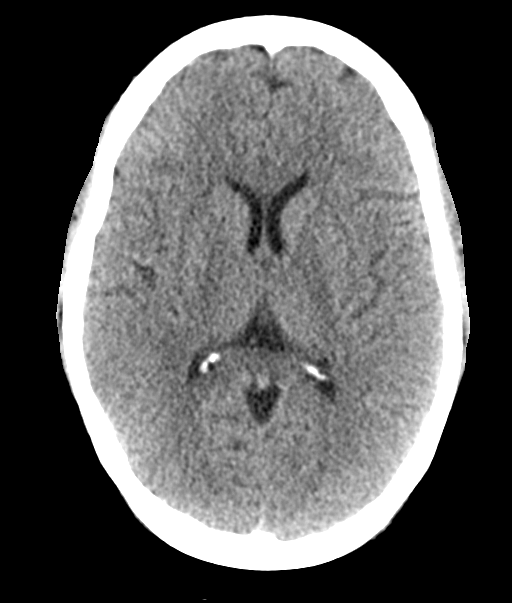
[im 18/34  bone]
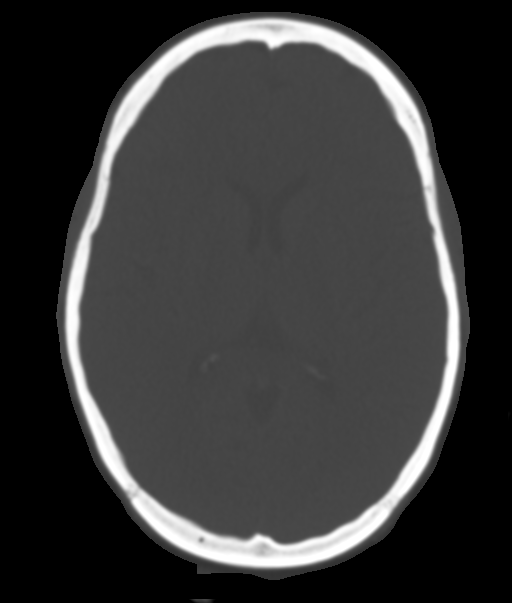
[im 23/34  brain]
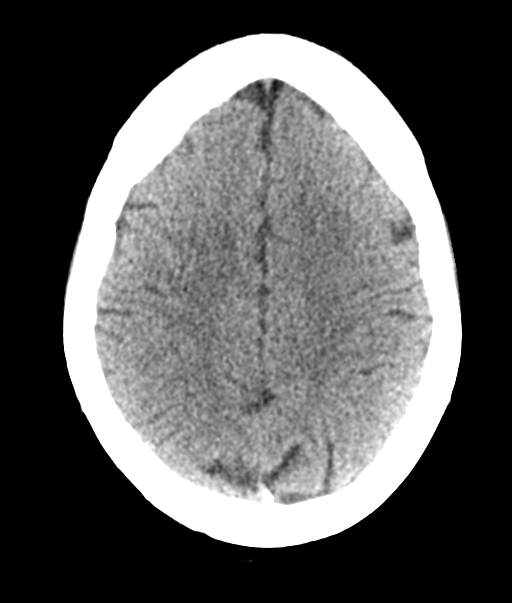
[im 27/34  brain]
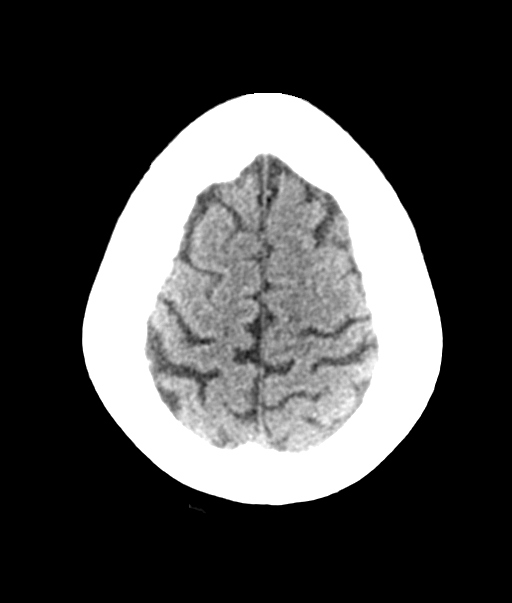
[im 31/34  brain]
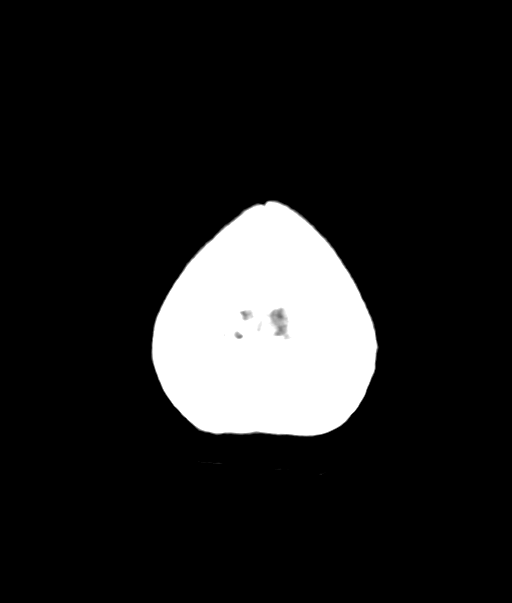

[14 of 47 positions shown; findings below may reference images not displayed]

FINDINGS: Brain: No evidence of acute infarction, hemorrhage, hydrocephalus,
extra-axial collection or mass lesion/mass effect.

Vascular: No hyperdense vessel or unexpected calcification.

Skull: Normal. Negative for fracture or focal lesion.

Sinuses/Orbits: The visualized paranasal sinuses are essentially
clear. The mastoid air cells are unopacified.

Other: None.
IMPRESSION: Normal head CT.

## 2020-12-13 ENCOUNTER — Other Ambulatory Visit: Payer: Self-pay | Admitting: Obstetrics and Gynecology

## 2020-12-13 DIAGNOSIS — I10 Essential (primary) hypertension: Secondary | ICD-10-CM

## 2021-03-27 ENCOUNTER — Emergency Department (HOSPITAL_COMMUNITY)
Admission: EM | Admit: 2021-03-27 | Discharge: 2021-03-27 | Disposition: A | Discharge status: Home / Self Care | Payer: Medicaid Other | Attending: Emergency Medicine | Admitting: Emergency Medicine

## 2021-03-27 ENCOUNTER — Other Ambulatory Visit: Payer: Self-pay

## 2021-03-27 DIAGNOSIS — R0789 Other chest pain: Secondary | ICD-10-CM | POA: Diagnosis not present

## 2021-03-27 DIAGNOSIS — M25511 Pain in right shoulder: Secondary | ICD-10-CM | POA: Insufficient documentation

## 2021-03-27 DIAGNOSIS — M546 Pain in thoracic spine: Secondary | ICD-10-CM | POA: Diagnosis not present

## 2021-03-27 DIAGNOSIS — I10 Essential (primary) hypertension: Secondary | ICD-10-CM | POA: Diagnosis not present

## 2021-03-27 DIAGNOSIS — Z7984 Long term (current) use of oral hypoglycemic drugs: Secondary | ICD-10-CM | POA: Diagnosis not present

## 2021-03-27 DIAGNOSIS — Z87891 Personal history of nicotine dependence: Secondary | ICD-10-CM | POA: Insufficient documentation

## 2021-03-27 DIAGNOSIS — R7303 Prediabetes: Secondary | ICD-10-CM | POA: Diagnosis not present

## 2021-03-27 DIAGNOSIS — R202 Paresthesia of skin: Secondary | ICD-10-CM | POA: Insufficient documentation

## 2021-03-27 DIAGNOSIS — Z79899 Other long term (current) drug therapy: Secondary | ICD-10-CM | POA: Insufficient documentation

## 2021-03-27 MED ORDER — PREDNISONE 10 MG PO TABS: 20.0000 mg | ORAL_TABLET | Freq: Two times a day (BID) | ORAL | 0 refills | Status: DC

## 2021-03-27 MED ORDER — TRAMADOL HCL 50 MG PO TABS: 50.0000 mg | ORAL_TABLET | Freq: Four times a day (QID) | ORAL | 0 refills | Status: DC | PRN

## 2021-03-27 MED ORDER — PREDNISONE 20 MG PO TABS
40.0000 mg | ORAL_TABLET | Freq: Once | ORAL | Status: AC
  Administered 2021-03-27: 40 mg via ORAL
  Filled 2021-03-27: qty 2

## 2021-03-27 NOTE — Discharge Instructions (Addendum)
Begin taking prednisone as prescribed.  Begin taking tramadol as prescribed as needed for pain.  Follow-up with your primary doctor if symptoms or not improving in the next week, and return to the ER symptoms significantly worsen or change.

## 2021-03-27 NOTE — ED Triage Notes (Signed)
Pt c/o of upper mid back pain that radiates to her right shoulder and down her right arm x 2 weeks. Pt denies any injury.

## 2021-03-27 NOTE — ED Provider Notes (Signed)
[facility] EMERGENCY DEPARTMENT Provider Note   CSN: [id number] Arrival date & time: 03/27/21  2219     History Chief Complaint  Patient presents with  . Back Pain    Alyssa Pearson is a 35 y.o. female.  Patient is a 35 year old female with past medical history of hypertension.  She presents today for evaluation of right back and shoulder pain.  This has been present for the past 10 days.  This began in the absence of any injury or trauma.  She describes a constant ache in the back part of her right shoulder and right upper anterior chest that is worse when she moves her arm.  The pain radiates into her arm and thumb.  She describes some numbness, but no weakness.  Pain is worse when she moves and changes position.  She has tried over-the-counter medications with little relief.  The history is provided by the patient.  Back Pain Location:  Thoracic spine Quality:  Stabbing Radiates to: Right arm. Pain severity:  Moderate Pain is:  Same all the time Onset quality:  Gradual Duration:  10 days Timing:  Constant Progression:  Worsening Chronicity:  New Relieved by:  Nothing Worsened by:  Movement and palpation      Past Medical History:  Diagnosis Date  . Anemia   . Anxiety    h/o   . Arthritis    bil feet  . Depression    h/o  . GERD (gastroesophageal reflux disease)    h/o  . Headache    migraines  . Hypertension   . Vertigo     Patient Active Problem List   Diagnosis Date Noted  . Hypertension 08/05/2020  . Pre-diabetes 07/10/2020  . Finger pain, left 07/10/2020  . Stye 07/10/2020  . Nausea and vomiting in adult 04/30/2020  . Elevated troponin 04/30/2020  . Depression 04/30/2020  . History of reversal of tubal ligation 09/17/2017    Past Surgical History:  Procedure Laterality Date  . CHROMOPERTUBATION N/A 09/14/2019   Procedure: CHROMOPERTUBATION;  Surgeon: Natale Milch, MD;  Location: [facility] ORS;  Service: Gynecology;  Laterality: N/A;   . DILATION AND EVACUATION N/A 12/19/2019   Procedure: DILATATION AND EVACUATION;  Surgeon: Natale Milch, MD;  Location: [facility] ORS;  Service: Gynecology;  Laterality: N/A;  . HYSTEROSCOPY WITH D & C N/A 09/14/2019   Procedure: DILATATION AND CURETTAGE /HYSTEROSCOPY, POLYPECTOMY;  Surgeon: Natale Milch, MD;  Location: [facility] OR[facility];  Service: Gynecology;  Laterality: N/A;  . LAPAROSCOPY N/A 09/14/2019   Procedure: LAPAROSCOPY DIAGNOSTIC;  Surgeon: Natale Milch, MD;  Location: [facility] OR[facility];  Service: Gynecology;  Laterality: N/A;  . TUBAL LIGATION    . Tubal reversal  08/31/2017  . WISDOM TOOTH EXTRACTION       OB History    Gravida  6   Para  3   Term  3   Preterm      AB  2   Living  3     SAB  2   IAB      Ectopic      Multiple      Live Births  3           Family History  Problem Relation Age of Onset  . Hypertension Mother   . Diabetes Father   . Hypertension Father     Social History   Tobacco Use  . Smoking status: Former Smoker    Packs/day: 0.50    Years: 7.00  Pack years: 3.50    Types: Cigarettes    Quit date: 09/10/2009    Years since quitting: 11.5  . Smokeless tobacco: Never Used  Vaping Use  . Vaping Use: Never used  Substance Use Topics  . Alcohol use: No  . Drug use: No    Home Medications Prior to Admission medications   Medication Sig Start Date End Date Taking? Authorizing Provider  acetaminophen (TYLENOL) 500 MG tablet Take 2 tablets (1,000 mg total) by mouth every 6 (six) hours as needed for moderate pain or headache. 12/19/19   Schuman, Christanna R, MD  amLODipine (NORVASC) 5 MG tablet TAKE 1 TABLET BY MOUTH EVERY DAY 12/16/20   Schuman, Christanna R, MD  ascorbic acid (VITAMIN C) 500 MG tablet Take 500 mg by mouth daily.    [provider]  HYDROcodone-acetaminophen (NORCO/VICODIN) 5-325 MG tablet Take 1 tablet by mouth every 4 (four) hours as needed. 06/30/20   Jacalyn Lefevre, MD  meclizine  (ANTIVERT) 25 MG tablet Take 25 mg by mouth as needed for dizziness.    [provider]  metFORMIN (GLUCOPHAGE XR) 500 MG 24 hr tablet Take 1 tablet (500 mg total) by mouth daily with breakfast. 07/08/20 07/08/21  Verdene Lennert, MD  ondansetron (ZOFRAN ODT) 4 MG disintegrating tablet Take 1 tablet (4 mg total) by mouth every 8 (eight) hours as needed for nausea or vomiting. 09/18/20   Eliezer Bottom, MD  Prenatal Vit-Fe Fumarate-FA (PRENATAL PO) Take 1 tablet by mouth daily.     [provider]    Allergies    Patient has no known allergies.  Review of Systems   Review of Systems  Musculoskeletal: Positive for back pain.  All other systems reviewed and are negative.   Physical Exam Updated Vital Signs BP 128/78   Pulse 73   Temp 99.9 F (37.7 C) (Oral)   Resp 16   Ht 5\' 4"  (1.626 m)   Wt 92.1 kg   SpO2 99%   BMI 34.84 kg/m   Physical Exam Vitals and nursing note reviewed.  Constitutional:      General: She is not in acute distress.    Appearance: Normal appearance. She is not ill-appearing.  HENT:     Head: Normocephalic and atraumatic.  Pulmonary:     Effort: Pulmonary effort is normal.  Musculoskeletal:     Comments: There is tenderness to palpation in the right posterior shoulder and upper back.  There is also tenderness to the right anterior chest wall.  Ulnar and radial pulses are easily palpable and motor and sensation are intact throughout the entire hand.  Strength is 5 out of 5 in both upper extremities throughout.  Skin:    General: Skin is warm and dry.  Neurological:     Mental Status: She is alert and oriented to person, place, and time.     ED Results / Procedures / Treatments   Labs (all labs ordered are listed, but only abnormal results are displayed) Labs Reviewed - No data to display  EKG None  Radiology No results found.  Procedures Procedures   Medications Ordered in ED Medications  predniSONE (DELTASONE) tablet 40 mg  (has no administration in time range)    ED Course  I have reviewed the triage vital signs and the nursing notes.  Pertinent labs & imaging results that were available during my care of the patient were reviewed by me and considered in my medical decision making (see chart for details).  MDM Rules/Calculators/A&P  Patient presenting with complaints of pain to her right shoulder and upper back that seems very musculoskeletal in nature.  She is not complaining of any difficulty breathing or cough.  She has not had any fever and I doubt an infectious etiology.  Her pain is worse with movement of her shoulder and palpation in these areas.  She will be treated with a course of prednisone and tramadol.  She is to follow-up with primary doctor if not improving.  Final Clinical Impression(s) / ED Diagnoses Final diagnoses:  None    Rx / DC Orders ED Discharge Orders    None       Geoffery Lyons, MD 03/27/21 2326

## 2021-03-28 ENCOUNTER — Telehealth: Payer: Self-pay | Admitting: *Deleted

## 2021-03-28 NOTE — Telephone Encounter (Signed)
Transition Care Management Unsuccessful Follow-up Telephone Call  Date of discharge and from where:  03/27/2021 - [facility]  Attempts:  1st Attempt  Reason for unsuccessful TCM follow-up call:  Voice mail full

## 2021-03-31 NOTE — Telephone Encounter (Signed)
Transition Care Management Unsuccessful Follow-up Telephone Call  Date of discharge and from where:  03/27/2021 - [facility]  Attempts:  2nd Attempt  Reason for unsuccessful TCM follow-up call:  Voice mail full

## 2021-04-01 ENCOUNTER — Other Ambulatory Visit: Payer: Self-pay | Admitting: Obstetrics and Gynecology

## 2021-04-01 DIAGNOSIS — O0910 Supervision of pregnancy with history of ectopic or molar pregnancy, unspecified trimester: Secondary | ICD-10-CM

## 2021-04-01 DIAGNOSIS — Z3490 Encounter for supervision of normal pregnancy, unspecified, unspecified trimester: Secondary | ICD-10-CM

## 2021-04-01 NOTE — Telephone Encounter (Signed)
Transition Care Management Unsuccessful Follow-up Telephone Call  Date of discharge and from where:  03/27/20[facility]  Attempts:  3rd Attempt  Reason for unsuccessful TCM follow-up call:  Voice mail full

## 2021-04-01 NOTE — Telephone Encounter (Signed)
Patient is scheduled for 04/02/21 for labs

## 2021-04-01 NOTE — Progress Notes (Signed)
Beta hcg labs ordered.

## 2021-04-02 ENCOUNTER — Other Ambulatory Visit: Payer: Self-pay

## 2021-04-02 ENCOUNTER — Other Ambulatory Visit: Payer: Medicaid Other

## 2021-04-02 DIAGNOSIS — O0910 Supervision of pregnancy with history of ectopic or molar pregnancy, unspecified trimester: Secondary | ICD-10-CM

## 2021-04-02 DIAGNOSIS — Z3490 Encounter for supervision of normal pregnancy, unspecified, unspecified trimester: Secondary | ICD-10-CM | POA: Diagnosis not present

## 2021-04-03 ENCOUNTER — Other Ambulatory Visit: Payer: Self-pay

## 2021-04-03 ENCOUNTER — Other Ambulatory Visit: Payer: Self-pay | Admitting: Obstetrics and Gynecology

## 2021-04-03 ENCOUNTER — Ambulatory Visit (INDEPENDENT_AMBULATORY_CARE_PROVIDER_SITE_OTHER): Payer: Medicaid Other | Admitting: Obstetrics

## 2021-04-03 ENCOUNTER — Encounter: Payer: Self-pay | Admitting: Obstetrics

## 2021-04-03 ENCOUNTER — Other Ambulatory Visit (HOSPITAL_COMMUNITY)
Admission: RE | Admit: 2021-04-03 | Discharge: 2021-04-03 | Disposition: A | Discharge status: Home / Self Care | Payer: Medicaid Other | Source: Ambulatory Visit | Attending: Obstetrics | Admitting: Obstetrics

## 2021-04-03 VITALS — BP 140/90 | HR 74 | Wt 217.0 lb

## 2021-04-03 DIAGNOSIS — Z3490 Encounter for supervision of normal pregnancy, unspecified, unspecified trimester: Secondary | ICD-10-CM

## 2021-04-03 DIAGNOSIS — Z113 Encounter for screening for infections with a predominantly sexual mode of transmission: Secondary | ICD-10-CM | POA: Diagnosis present

## 2021-04-03 DIAGNOSIS — O10919 Unspecified pre-existing hypertension complicating pregnancy, unspecified trimester: Secondary | ICD-10-CM

## 2021-04-03 DIAGNOSIS — Z124 Encounter for screening for malignant neoplasm of cervix: Secondary | ICD-10-CM | POA: Insufficient documentation

## 2021-04-03 DIAGNOSIS — Z3480 Encounter for supervision of other normal pregnancy, unspecified trimester: Secondary | ICD-10-CM | POA: Insufficient documentation

## 2021-04-03 DIAGNOSIS — Z3A01 Less than 8 weeks gestation of pregnancy: Secondary | ICD-10-CM | POA: Diagnosis not present

## 2021-04-03 DIAGNOSIS — O09291 Supervision of pregnancy with other poor reproductive or obstetric history, first trimester: Secondary | ICD-10-CM

## 2021-04-03 DIAGNOSIS — O0910 Supervision of pregnancy with history of ectopic or molar pregnancy, unspecified trimester: Secondary | ICD-10-CM

## 2021-04-03 DIAGNOSIS — Z3A08 8 weeks gestation of pregnancy: Secondary | ICD-10-CM

## 2021-04-03 LAB — BETA HCG QUANT (REF LAB): hCG Quant: 204 m[IU]/mL

## 2021-04-03 MED ORDER — PROGESTERONE 200 MG PO CAPS: 200.0000 mg | ORAL_CAPSULE | Freq: Every day | ORAL | 3 refills | Status: DC

## 2021-04-03 NOTE — Telephone Encounter (Signed)
Please advise 

## 2021-04-03 NOTE — Telephone Encounter (Signed)
Schedule for a visit next week with me- thank you

## 2021-04-03 NOTE — Progress Notes (Signed)
Positive home test. Positive HCG here 04/02/2021

## 2021-04-03 NOTE — Addendum Note (Signed)
Addended by: Mirna Mires on: 04/03/2021 06:42 PM   Modules accepted: Orders

## 2021-04-03 NOTE — Progress Notes (Signed)
New Obstetric Patient H&P    Chief Complaint: "Desires prenatal care"   History of Present Illness: Patient is a 35 y.o. [id number] Not Hispanic or Latino female, LMP 03/03/2021 presents with amenorrhea and positive home pregnancy test. Based on her  LMP, her EDD is Estimated Date of Delivery: 12/08/21 and her EGA is [redacted]w[redacted]d. Cycles are 5. days, regular, and occur approximately every : 28 days. Her last pap smear was 2 years ago and was no abnormalities.    She had a urine pregnancy test which was positive 1 week(s)  ago. Her last menstrual period was normal and lasted for  5 day(s). Since her LMP she claims she has experienced few symptoms. She denies vaginal bleeding. Her past medical history is noncontributory. Her prior pregnancies are notable for several SABs and three SVDs  This pregnancy was planned, and is the result for a BTL reversal.  Since her LMP, she admits to the use of tobacco products  no She claims she has gained   no pounds since the start of her pregnancy.  There are cats in the home in the home  no  She admits close contact with children on a regular basis  yes  She has had chicken pox in the past yes She has had Tuberculosis exposures, symptoms, or previously tested positive for TB   no Current or past history of domestic violence. no  Genetic Screening/Teratology Counseling: (Includes patient, baby's father, or anyone in either family with:)   1. Patient's age >/= 63 at [facility]  yes 2. Thalassemia ([place], [place], Mediterranean, or Asian background): MCV<80  no 3. Neural tube defect (meningomyelocele, spina bifida, anencephaly)  no 4. Congenital heart defect  no  5. Down syndrome  no 6. Tay-Sachs (Jewish, [place]  no 7. Canavan's Disease  no 8. Sickle cell disease or trait (African)  no  9. Hemophilia or other blood disorders  no  10. Muscular dystrophy  no  11. Cystic fibrosis  no  12. Huntington's Chorea  no  13. Mental retardation/autism  no 14.  Other inherited genetic or chromosomal disorder  no 15. Maternal metabolic disorder (DM, PKU, etc)  no 16. Patient or FOB with a child with a birth defect not listed above no  16a. Patient or FOB with a birth defect themselves no 17. Recurrent pregnancy loss, or stillbirth  yes  18. Any medications since LMP other than prenatal vitamins (include vitamins, supplements, OTC meds, drugs, alcohol)  Yes NORVASC 19. Any other genetic/environmental exposure to discuss  no  Infection History:   1. Lives with someone with TB or TB exposed  no  2. Patient or partner has history of genital herpes  no 3. Rash or viral illness since LMP  no 4. History of STI (GC, CT, HPV, syphilis, HIV)  Yes- Chlamydia years ago. 5. History of recent travel :  no  Other pertinent information:  no     Review of Systems:10 point review of systems negative unless otherwise noted in HPI  Past Medical History:  Past Medical History:  Diagnosis Date  . Anemia   . Anxiety    h/o   . Arthritis    bil feet  . Depression    h/o  . GERD (gastroesophageal reflux disease)    h/o  . Headache    migraines  . Hypertension   . Vertigo     Past Surgical History:  Past Surgical History:  Procedure Laterality Date  .  CHROMOPERTUBATION N/A 09/14/2019   Procedure: CHROMOPERTUBATION;  Surgeon: Natale Milch, MD;  Location: [facility];  Service: Gynecology;  Laterality: N/A;  . DILATION AND EVACUATION N/A 12/19/2019   Procedure: DILATATION AND EVACUATION;  Surgeon: Natale Milch, MD;  Location: [facility];  Service: Gynecology;  Laterality: N/A;  . HYSTEROSCOPY WITH D & C N/A 09/14/2019   Procedure: DILATATION AND CURETTAGE /HYSTEROSCOPY, POLYPECTOMY;  Surgeon: Natale Milch, MD;  Location: [facility];  Service: Gynecology;  Laterality: N/A;  . LAPAROSCOPY N/A 09/14/2019   Procedure: LAPAROSCOPY DIAGNOSTIC;  Surgeon: Natale Milch, MD;  Location: [facility];  Service: Gynecology;  Laterality: N/A;   . TUBAL LIGATION    . Tubal reversal  08/31/2017  . WISDOM TOOTH EXTRACTION      Gynecologic History: Patient's last menstrual period was 03/03/2021 (exact date).  Obstetric History: [id number]  Family History:  Family History  Problem Relation Age of Onset  . Hypertension Mother   . Diabetes Father   . Hypertension Father     Social History:  Social History   Socioeconomic History  . Marital status: Significant Other    Spouse name: Not on file  . Number of children: Not on file  . Years of education: Not on file  . Highest education level: Not on file  Occupational History  . Not on file  Tobacco Use  . Smoking status: Former Smoker    Packs/day: 0.50    Years: 7.00    Pack years: 3.50    Types: Cigarettes    Quit date: 09/10/2009    Years since quitting: 11.5  . Smokeless tobacco: Never Used  Vaping Use  . Vaping Use: Never used  Substance and Sexual Activity  . Alcohol use: No  . Drug use: No  . Sexual activity: Yes    Birth control/protection: None  Other Topics Concern  . Not on file  Social History Narrative  . Not on file   Social Determinants of Health   Financial Resource Strain: Not on file  Food Insecurity: Not on file  Transportation Needs: Not on file  Physical Activity: Not on file  Stress: Not on file  Social Connections: Not on file  Intimate Partner Violence: Not on file    Allergies:  No Known Allergies  Medications: Prior to Admission medications   Medication Sig Start Date End Date Taking? Authorizing Provider  Prenatal Vit-Fe Fumarate-FA (PRENATAL PO) Take 1 tablet by mouth daily.    Yes [provider]  acetaminophen (TYLENOL) 500 MG tablet Take 2 tablets (1,000 mg total) by mouth every 6 (six) hours as needed for moderate pain or headache. Patient not taking: Reported on 04/03/2021 12/19/19   Adelene Idler R, MD  amLODipine (NORVASC) 5 MG tablet TAKE 1 TABLET BY MOUTH EVERY DAY Patient not taking: Reported on  04/03/2021 12/16/20   Natale Milch, MD  ascorbic acid (VITAMIN C) 500 MG tablet Take 500 mg by mouth daily. Patient not taking: Reported on 04/03/2021    [provider]  HYDROcodone-acetaminophen (NORCO/VICODIN) 5-325 MG tablet Take 1 tablet by mouth every 4 (four) hours as needed. Patient not taking: Reported on 04/03/2021 06/30/20   Jacalyn Lefevre, MD  meclizine (ANTIVERT) 25 MG tablet Take 25 mg by mouth as needed for dizziness. Patient not taking: Reported on 04/03/2021    [provider]  metFORMIN (GLUCOPHAGE XR) 500 MG 24 hr tablet Take 1 tablet (500 mg total) by mouth daily with breakfast. Patient not taking: Reported  on 04/03/2021 07/08/20 07/08/21  Verdene Lennert, MD  ondansetron (ZOFRAN ODT) 4 MG disintegrating tablet Take 1 tablet (4 mg total) by mouth every 8 (eight) hours as needed for nausea or vomiting. Patient not taking: Reported on 04/03/2021 09/18/20   Eliezer Bottom, MD  predniSONE (DELTASONE) 10 MG tablet Take 2 tablets (20 mg total) by mouth 2 (two) times daily. Patient not taking: Reported on 04/03/2021 03/27/21   Geoffery Lyons, MD  progesterone (PROMETRIUM) 200 MG capsule Take 1 capsule (200 mg total) by mouth daily. Patient not taking: Reported on 04/03/2021 04/03/21   Natale Milch, MD  traMADol (ULTRAM) 50 MG tablet Take 1 tablet (50 mg total) by mouth every 6 (six) hours as needed. Patient not taking: Reported on 04/03/2021 03/27/21   Geoffery Lyons, MD    Physical Exam Vitals: Blood pressure 140/90, pulse 74, weight 217 lb (98.4 kg), last menstrual period 03/03/2021.  General: NAD HEENT: normocephalic, anicteric Thyroid: no enlargement, no palpable nodules Pulmonary: No increased work of breathing, CTAB Cardiovascular: RRR, distal pulses 2+ Abdomen: NABS, soft, non-tender, non-distended.  Umbilicus without lesions.  No hepatomegaly, splenomegaly or masses palpable. No evidence of hernia  Genitourinary:  External: Normal external female  genitalia.  Normal urethral meatus, normal  Bartholin's and Skene's glands.    Vagina: Normal vaginal mucosa, no evidence of prolapse.    Cervix: Grossly normal in appearance, no bleeding  Uterus: anteverted, Non-enlarged, mobile, normal contour.  No CMT  Adnexa: ovaries non-enlarged, no adnexal masses  Rectal: deferred Extremities: no edema, erythema, or tenderness Neurologic: Grossly intact Psychiatric: mood appropriate, affect full   Assessment: 35 y.o. [id number] at [redacted]w[redacted]d presenting to initiate prenatal care Pregnancy result of Tubal reversal.  Plan: 1) Avoid alcoholic beverages. 2) Patient encouraged not to smoke.  3) Discontinue the use of all non-medicinal drugs and chemicals.  4) Take prenatal vitamins daily.  5) Nutrition, food safety (fish, cheese advisories, and high nitrite foods) and exercise discussed. 6) Hospital and practice style discussed with cross coverage system.  7) Genetic Screening, such as with 1st Trimester Screening, cell free fetal DNA, AFP testing, and Ultrasound, as well as with amniocentesis and CVS as appropriate, is discussed with patient. At the conclusion of today's visit patient requested genetic testing 8) Patient is asked about travel to areas at risk for the Zika virus, and counseled to avoid travel and exposure to mosquitoes or sexual partners who may have themselves been exposed to the virus. Testing is discussed, and will be ordered as appropriate.  We discussed breastfeeding and the benefits today.  She saw Dr. Jerene Pitch yesterday and is scheduled for a second Beta HC draw as this pregnancy is the result of BTL reversal. There is some concern that she is at increased risk  for ectopic pregnancy. She is a chronic HTN patient ,and admits to not taking her Norvasc. Strongly advised to take the Norvasc as prescribed. Discussed her elevated BPs today.  She desires genetic testing and will have the MaternT testing and perhaps the Inheritesting once she  reaches [redacted] weeks gestation.  Dating scan ordered for 4 weeks out. Will draw baseline PIH labs at next visit.

## 2021-04-04 ENCOUNTER — Other Ambulatory Visit: Payer: Self-pay

## 2021-04-04 ENCOUNTER — Other Ambulatory Visit: Payer: Medicaid Other

## 2021-04-04 DIAGNOSIS — Z3490 Encounter for supervision of normal pregnancy, unspecified, unspecified trimester: Secondary | ICD-10-CM | POA: Diagnosis not present

## 2021-04-04 DIAGNOSIS — O0910 Supervision of pregnancy with history of ectopic or molar pregnancy, unspecified trimester: Secondary | ICD-10-CM

## 2021-04-05 LAB — BETA HCG QUANT (REF LAB): hCG Quant: 659 m[IU]/mL

## 2021-04-07 DIAGNOSIS — Z3490 Encounter for supervision of normal pregnancy, unspecified, unspecified trimester: Secondary | ICD-10-CM | POA: Diagnosis not present

## 2021-04-07 DIAGNOSIS — O0910 Supervision of pregnancy with history of ectopic or molar pregnancy, unspecified trimester: Secondary | ICD-10-CM | POA: Diagnosis not present

## 2021-04-07 LAB — CYTOLOGY - PAP
Chlamydia: NEGATIVE
Comment: NEGATIVE
Comment: NEGATIVE
Comment: NEGATIVE
Comment: NORMAL
Diagnosis: NEGATIVE
High risk HPV: NEGATIVE
Neisseria Gonorrhea: NEGATIVE
Trichomonas: NEGATIVE

## 2021-04-08 ENCOUNTER — Other Ambulatory Visit: Payer: Self-pay | Admitting: Obstetrics

## 2021-04-08 DIAGNOSIS — B373 Candidiasis of vulva and vagina: Secondary | ICD-10-CM

## 2021-04-08 DIAGNOSIS — B3731 Acute candidiasis of vulva and vagina: Secondary | ICD-10-CM

## 2021-04-08 LAB — BETA HCG QUANT (REF LAB): hCG Quant: 1853 m[IU]/mL

## 2021-04-08 MED ORDER — FLUCONAZOLE 150 MG PO TABS: 150.0000 mg | ORAL_TABLET | Freq: Once | ORAL | 0 refills | Status: AC

## 2021-04-08 NOTE — Progress Notes (Signed)
Please schedule patient a visit with me next week, GYN visit Thank you!!!

## 2021-04-09 LAB — URINE CULTURE

## 2021-04-14 ENCOUNTER — Encounter: Payer: Medicaid Other | Admitting: Obstetrics and Gynecology

## 2021-04-16 ENCOUNTER — Observation Stay
Admission: EM | Admit: 2021-04-16 | Discharge: 2021-04-18 | Disposition: A | Discharge status: Home / Self Care | Payer: Medicaid Other | Attending: Obstetrics & Gynecology | Admitting: Obstetrics & Gynecology

## 2021-04-16 ENCOUNTER — Emergency Department: Payer: Medicaid Other

## 2021-04-16 ENCOUNTER — Emergency Department: Payer: Medicaid Other | Admitting: Certified Registered"

## 2021-04-16 ENCOUNTER — Encounter
Admission: EM | Disposition: A | Discharge status: Home / Self Care | Payer: Self-pay | Source: Home / Self Care | Attending: Emergency Medicine

## 2021-04-16 ENCOUNTER — Other Ambulatory Visit: Payer: Self-pay

## 2021-04-16 DIAGNOSIS — O26891 Other specified pregnancy related conditions, first trimester: Secondary | ICD-10-CM

## 2021-04-16 DIAGNOSIS — O98811 Other maternal infectious and parasitic diseases complicating pregnancy, first trimester: Secondary | ICD-10-CM | POA: Diagnosis not present

## 2021-04-16 DIAGNOSIS — B9689 Other specified bacterial agents as the cause of diseases classified elsewhere: Secondary | ICD-10-CM | POA: Insufficient documentation

## 2021-04-16 DIAGNOSIS — R7303 Prediabetes: Secondary | ICD-10-CM | POA: Diagnosis not present

## 2021-04-16 DIAGNOSIS — Z87891 Personal history of nicotine dependence: Secondary | ICD-10-CM | POA: Insufficient documentation

## 2021-04-16 DIAGNOSIS — Z7984 Long term (current) use of oral hypoglycemic drugs: Secondary | ICD-10-CM | POA: Diagnosis not present

## 2021-04-16 DIAGNOSIS — Z3A01 Less than 8 weeks gestation of pregnancy: Secondary | ICD-10-CM | POA: Insufficient documentation

## 2021-04-16 DIAGNOSIS — I499 Cardiac arrhythmia, unspecified: Secondary | ICD-10-CM | POA: Diagnosis not present

## 2021-04-16 DIAGNOSIS — R42 Dizziness and giddiness: Secondary | ICD-10-CM | POA: Diagnosis not present

## 2021-04-16 DIAGNOSIS — O009 Unspecified ectopic pregnancy without intrauterine pregnancy: Secondary | ICD-10-CM | POA: Diagnosis present

## 2021-04-16 DIAGNOSIS — O2311 Infections of bladder in pregnancy, first trimester: Secondary | ICD-10-CM | POA: Diagnosis not present

## 2021-04-16 DIAGNOSIS — O0882 Sepsis following ectopic and molar pregnancy: Secondary | ICD-10-CM | POA: Diagnosis not present

## 2021-04-16 DIAGNOSIS — O0883 Urinary tract infection following an ectopic and molar pregnancy: Secondary | ICD-10-CM

## 2021-04-16 DIAGNOSIS — O00101 Right tubal pregnancy without intrauterine pregnancy: Principal | ICD-10-CM | POA: Insufficient documentation

## 2021-04-16 DIAGNOSIS — O10011 Pre-existing essential hypertension complicating pregnancy, first trimester: Secondary | ICD-10-CM | POA: Insufficient documentation

## 2021-04-16 DIAGNOSIS — Z79899 Other long term (current) drug therapy: Secondary | ICD-10-CM | POA: Diagnosis not present

## 2021-04-16 DIAGNOSIS — Z20822 Contact with and (suspected) exposure to covid-19: Secondary | ICD-10-CM | POA: Insufficient documentation

## 2021-04-16 DIAGNOSIS — N3001 Acute cystitis with hematuria: Secondary | ICD-10-CM | POA: Diagnosis not present

## 2021-04-16 DIAGNOSIS — A419 Sepsis, unspecified organism: Secondary | ICD-10-CM | POA: Diagnosis not present

## 2021-04-16 DIAGNOSIS — N838 Other noninflammatory disorders of ovary, fallopian tube and broad ligament: Secondary | ICD-10-CM | POA: Diagnosis not present

## 2021-04-16 HISTORY — DX: Unspecified ectopic pregnancy without intrauterine pregnancy: O00.90

## 2021-04-16 HISTORY — PX: DIAGNOSTIC LAPAROSCOPY WITH REMOVAL OF ECTOPIC PREGNANCY: SHX6449

## 2021-04-16 HISTORY — PX: LAPAROSCOPIC UNILATERAL SALPINGECTOMY: SHX5934

## 2021-04-16 LAB — URINALYSIS, COMPLETE (UACMP) WITH MICROSCOPIC
Bilirubin Urine: NEGATIVE
Glucose, UA: NEGATIVE mg/dL
Hgb urine dipstick: NEGATIVE
Ketones, ur: 5 mg/dL — AB
Nitrite: NEGATIVE
Protein, ur: 30 mg/dL — AB
Specific Gravity, Urine: 1.018 (ref 1.005–1.030)
pH: 5 (ref 5.0–8.0)

## 2021-04-16 LAB — COMPREHENSIVE METABOLIC PANEL
ALT: 11 U/L (ref 0–44)
AST: 22 U/L (ref 15–41)
Albumin: 4 g/dL (ref 3.5–5.0)
Alkaline Phosphatase: 65 U/L (ref 38–126)
Anion gap: 11 (ref 5–15)
BUN: 10 mg/dL (ref 6–20)
CO2: 22 mmol/L (ref 22–32)
Calcium: 9.1 mg/dL (ref 8.9–10.3)
Chloride: 101 mmol/L (ref 98–111)
Creatinine, Ser: 0.81 mg/dL (ref 0.44–1.00)
GFR, Estimated: >60 mL/min (ref >60)
Glucose, Bld: 167 mg/dL — ABNORMAL HIGH (ref 70–99)
Potassium: 4 mmol/L (ref 3.5–5.1)
Sodium: 134 mmol/L — ABNORMAL LOW (ref 135–145)
Total Bilirubin: 0.7 mg/dL (ref 0.3–1.2)
Total Protein: 7.8 g/dL (ref 6.5–8.1)

## 2021-04-16 LAB — LIPASE, BLOOD: Lipase: 24 U/L (ref 11–51)

## 2021-04-16 LAB — CBC
HCT: 28.1 % — ABNORMAL LOW (ref 36.0–46.0)
HCT: 25.3 % — ABNORMAL LOW (ref 36.0–46.0)
Hemoglobin: 9.3 g/dL — ABNORMAL LOW (ref 12.0–15.0)
Hemoglobin: 8 g/dL — ABNORMAL LOW (ref 12.0–15.0)
MCH: 28.5 pg (ref 26.0–34.0)
MCH: 28.7 pg (ref 26.0–34.0)
MCHC: 33.1 g/dL (ref 30.0–36.0)
MCHC: 31.6 g/dL (ref 30.0–36.0)
MCV: 86.2 fL (ref 80.0–100.0)
MCV: 90.7 fL (ref 80.0–100.0)
Platelets: 344 10*3/uL (ref 150–400)
Platelets: 232 10*3/uL (ref 150–400)
RBC: 3.26 MIL/uL — ABNORMAL LOW (ref 3.87–5.11)
RBC: 2.79 MIL/uL — ABNORMAL LOW (ref 3.87–5.11)
RDW: 15.7 % — ABNORMAL HIGH (ref 11.5–15.5)
RDW: 15.9 % — ABNORMAL HIGH (ref 11.5–15.5)
WBC: 22.2 10*3/uL — ABNORMAL HIGH (ref 4.0–10.5)
WBC: 23.3 10*3/uL — ABNORMAL HIGH (ref 4.0–10.5)
nRBC: 0 % (ref 0.0–0.2)
nRBC: 0 % (ref 0.0–0.2)

## 2021-04-16 LAB — RESP PANEL BY RT-PCR (FLU A&B, COVID) ARPGX2
Influenza A by PCR: NEGATIVE
Influenza B by PCR: NEGATIVE
SARS Coronavirus 2 by RT PCR: NEGATIVE

## 2021-04-16 LAB — APTT: aPTT: 25 seconds (ref 24–36)

## 2021-04-16 LAB — HCG, QUANTITATIVE, PREGNANCY: hCG, Beta Chain, Quant, S: 7624 m[IU]/mL — ABNORMAL HIGH (ref <5)

## 2021-04-16 LAB — PROTIME-INR
INR: 1 (ref 0.8–1.2)
Prothrombin Time: 13.5 seconds (ref 11.4–15.2)

## 2021-04-16 LAB — ABO/RH: ABO/RH(D): A POS

## 2021-04-16 LAB — LACTIC ACID, PLASMA
Lactic Acid, Venous: 3.8 mmol/L (ref 0.5–1.9)
Lactic Acid, Venous: 2.1 mmol/L (ref 0.5–1.9)
Lactic Acid, Venous: 3.4 mmol/L (ref 0.5–1.9)

## 2021-04-16 LAB — D-DIMER, QUANTITATIVE: D-Dimer, Quant: 6.06 ug/mL-FEU — ABNORMAL HIGH (ref 0.00–0.50)

## 2021-04-16 SURGERY — LAPAROSCOPY, WITH ECTOPIC PREGNANCY SURGICAL TREATMENT
Anesthesia: General | Laterality: Right

## 2021-04-16 MED ORDER — POVIDONE-IODINE 10 % EX SWAB: 2.0000 "application " | Freq: Once | CUTANEOUS | Status: DC

## 2021-04-16 MED ORDER — ALBUMIN HUMAN 5 % IV SOLN
INTRAVENOUS | Status: AC
  Filled 2021-04-16: qty 250

## 2021-04-16 MED ORDER — ONDANSETRON HCL 4 MG/2ML IJ SOLN: 4.0000 mg | Freq: Four times a day (QID) | INTRAMUSCULAR | Status: DC | PRN

## 2021-04-16 MED ORDER — SODIUM CHLORIDE 0.9 % IV SOLN
1.0000 g | Freq: Once | INTRAVENOUS | Status: AC
  Administered 2021-04-16: 1 g via INTRAVENOUS
  Filled 2021-04-16: qty 10

## 2021-04-16 MED ORDER — ONDANSETRON HCL 4 MG PO TABS: 4.0000 mg | ORAL_TABLET | Freq: Four times a day (QID) | ORAL | Status: DC | PRN

## 2021-04-16 MED ORDER — ONDANSETRON HCL 4 MG/2ML IJ SOLN
INTRAMUSCULAR | Status: DC | PRN
  Administered 2021-04-16: 4 mg via INTRAVENOUS

## 2021-04-16 MED ORDER — AMLODIPINE BESYLATE 5 MG PO TABS
5.0000 mg | ORAL_TABLET | Freq: Every day | ORAL | Status: DC
  Administered 2021-04-17 – 2021-04-18 (×2): 5 mg via ORAL
  Filled 2021-04-16 (×2): qty 1

## 2021-04-16 MED ORDER — MORPHINE SULFATE (PF) 2 MG/ML IV SOLN: 1.0000 mg | INTRAVENOUS | Status: DC | PRN

## 2021-04-16 MED ORDER — SODIUM CHLORIDE 0.9 % IR SOLN
Status: DC | PRN
  Administered 2021-04-16: 300 mL

## 2021-04-16 MED ORDER — SODIUM CHLORIDE 0.9 % IV SOLN
1.0000 g | Freq: Three times a day (TID) | INTRAVENOUS | Status: DC
  Administered 2021-04-16 – 2021-04-18 (×6): 1 g via INTRAVENOUS
  Filled 2021-04-16 (×3): qty 10
  Filled 2021-04-16 (×2): qty 1
  Filled 2021-04-16 (×2): qty 10
  Filled 2021-04-16 (×2): qty 1

## 2021-04-16 MED ORDER — ACETAMINOPHEN 325 MG PO TABS: 650.0000 mg | ORAL_TABLET | ORAL | Status: DC | PRN

## 2021-04-16 MED ORDER — LACTATED RINGERS IV SOLN: INTRAVENOUS | Status: DC | PRN

## 2021-04-16 MED ORDER — FENTANYL CITRATE (PF) 100 MCG/2ML IJ SOLN
INTRAMUSCULAR | Status: DC | PRN
  Administered 2021-04-16 (×2): 50 ug via INTRAVENOUS

## 2021-04-16 MED ORDER — LACTATED RINGERS IV BOLUS
1000.0000 mL | Freq: Once | INTRAVENOUS | Status: AC
  Administered 2021-04-16: 1000 mL via INTRAVENOUS

## 2021-04-16 MED ORDER — LACTATED RINGERS IV SOLN: INTRAVENOUS | Status: DC

## 2021-04-16 MED ORDER — SIMETHICONE 80 MG PO CHEW: 80.0000 mg | CHEWABLE_TABLET | Freq: Four times a day (QID) | ORAL | Status: DC | PRN

## 2021-04-16 MED ORDER — BUPIVACAINE HCL (PF) 0.5 % IJ SOLN
INTRAMUSCULAR | Status: DC | PRN
  Administered 2021-04-16: 6 mL

## 2021-04-16 MED ORDER — ALBUMIN HUMAN 5 % IV SOLN: INTRAVENOUS | Status: DC | PRN

## 2021-04-16 MED ORDER — LIDOCAINE HCL (CARDIAC) PF 100 MG/5ML IV SOSY
PREFILLED_SYRINGE | INTRAVENOUS | Status: DC | PRN
  Administered 2021-04-16: 60 mg via INTRAVENOUS

## 2021-04-16 MED ORDER — OXYCODONE-ACETAMINOPHEN 5-325 MG PO TABS
1.0000 | ORAL_TABLET | ORAL | Status: DC | PRN
  Administered 2021-04-17 – 2021-04-18 (×6): 2 via ORAL
  Filled 2021-04-16 (×6): qty 2

## 2021-04-16 MED ORDER — SENNOSIDES-DOCUSATE SODIUM 8.6-50 MG PO TABS: 1.0000 | ORAL_TABLET | Freq: Every evening | ORAL | Status: DC | PRN

## 2021-04-16 MED ORDER — ONDANSETRON HCL 4 MG/2ML IJ SOLN: 4.0000 mg | Freq: Once | INTRAMUSCULAR | Status: DC | PRN

## 2021-04-16 MED ORDER — SODIUM CHLORIDE 0.9 % IV SOLN: 1.0000 g | Freq: Three times a day (TID) | INTRAVENOUS | Status: DC

## 2021-04-16 MED ORDER — SUCCINYLCHOLINE CHLORIDE 20 MG/ML IJ SOLN
INTRAMUSCULAR | Status: DC | PRN
  Administered 2021-04-16: 100 mg via INTRAVENOUS

## 2021-04-16 MED ORDER — ACETAMINOPHEN 10 MG/ML IV SOLN
INTRAVENOUS | Status: DC | PRN
  Administered 2021-04-16: 1000 mg via INTRAVENOUS

## 2021-04-16 MED ORDER — MIDAZOLAM HCL 2 MG/2ML IJ SOLN
INTRAMUSCULAR | Status: AC
  Filled 2021-04-16: qty 2

## 2021-04-16 MED ORDER — BISACODYL 10 MG RE SUPP: 10.0000 mg | Freq: Every day | RECTAL | Status: DC | PRN

## 2021-04-16 MED ORDER — PROPOFOL 10 MG/ML IV BOLUS
INTRAVENOUS | Status: DC | PRN
  Administered 2021-04-16: 150 mg via INTRAVENOUS

## 2021-04-16 MED ORDER — SUGAMMADEX SODIUM 200 MG/2ML IV SOLN
INTRAVENOUS | Status: DC | PRN
  Administered 2021-04-16: 200 mg via INTRAVENOUS

## 2021-04-16 MED ORDER — FENTANYL CITRATE (PF) 100 MCG/2ML IJ SOLN
INTRAMUSCULAR | Status: AC
  Filled 2021-04-16: qty 2

## 2021-04-16 MED ORDER — MIDAZOLAM HCL 2 MG/2ML IJ SOLN
INTRAMUSCULAR | Status: DC | PRN
  Administered 2021-04-16: 2 mg via INTRAVENOUS

## 2021-04-16 MED ORDER — ACETAMINOPHEN 500 MG PO TABS
1000.0000 mg | ORAL_TABLET | Freq: Once | ORAL | Status: AC
  Administered 2021-04-16: 1000 mg via ORAL
  Filled 2021-04-16: qty 2

## 2021-04-16 MED ORDER — ACETAMINOPHEN 10 MG/ML IV SOLN
INTRAVENOUS | Status: AC
  Filled 2021-04-16: qty 100

## 2021-04-16 MED ORDER — FENTANYL CITRATE (PF) 100 MCG/2ML IJ SOLN: 25.0000 ug | INTRAMUSCULAR | Status: DC | PRN

## 2021-04-16 MED ORDER — DOCUSATE SODIUM 100 MG PO CAPS
100.0000 mg | ORAL_CAPSULE | Freq: Two times a day (BID) | ORAL | Status: DC
  Administered 2021-04-17 – 2021-04-18 (×3): 100 mg via ORAL
  Filled 2021-04-16 (×3): qty 1

## 2021-04-16 MED ORDER — KETOROLAC TROMETHAMINE 30 MG/ML IJ SOLN
30.0000 mg | Freq: Four times a day (QID) | INTRAMUSCULAR | Status: AC
  Administered 2021-04-16 – 2021-04-17 (×4): 30 mg via INTRAVENOUS
  Filled 2021-04-16 (×4): qty 1

## 2021-04-16 MED ORDER — DEXMEDETOMIDINE (PRECEDEX) IN NS 20 MCG/5ML (4 MCG/ML) IV SYRINGE
PREFILLED_SYRINGE | INTRAVENOUS | Status: DC | PRN
  Administered 2021-04-16 (×2): 4 ug via INTRAVENOUS
  Administered 2021-04-16: 8 ug via INTRAVENOUS
  Administered 2021-04-16: 4 ug via INTRAVENOUS

## 2021-04-16 MED ORDER — DEXAMETHASONE SODIUM PHOSPHATE 10 MG/ML IJ SOLN
INTRAMUSCULAR | Status: DC | PRN
  Administered 2021-04-16: 10 mg via INTRAVENOUS

## 2021-04-16 MED ORDER — SODIUM CHLORIDE 0.9 % IV SOLN: INTRAVENOUS | Status: DC

## 2021-04-16 MED ORDER — ROCURONIUM BROMIDE 100 MG/10ML IV SOLN
INTRAVENOUS | Status: DC | PRN
  Administered 2021-04-16: 40 mg via INTRAVENOUS
  Administered 2021-04-16: 10 mg via INTRAVENOUS
  Administered 2021-04-16: 20 mg via INTRAVENOUS

## 2021-04-16 MED ORDER — LACTATED RINGERS IV BOLUS
500.0000 mL | Freq: Once | INTRAVENOUS | Status: AC
  Administered 2021-04-16: 500 mL via INTRAVENOUS

## 2021-04-16 SURGICAL SUPPLY — 39 items
BLADE SURG SZ11 CARB STEEL (BLADE) ×2 IMPLANT
CATH ROBINSON RED A/P 16FR (CATHETERS) ×2 IMPLANT
CHLORAPREP W/TINT 26 (MISCELLANEOUS) ×2 IMPLANT
COVER WAND RF STERILE (DRAPES) ×2 IMPLANT
DERMABOND ADVANCED (GAUZE/BANDAGES/DRESSINGS) ×1
DERMABOND ADVANCED .7 DNX12 (GAUZE/BANDAGES/DRESSINGS) ×1 IMPLANT
DRSG TEGADERM 2-3/8X2-3/4 SM (GAUZE/BANDAGES/DRESSINGS) IMPLANT
DRSG TELFA 4X3 1S NADH ST (GAUZE/BANDAGES/DRESSINGS) ×2 IMPLANT
GAUZE 4X4 16PLY RFD (DISPOSABLE) IMPLANT
GLOVE SURG ENC MOIS LTX SZ8 (GLOVE) ×4 IMPLANT
GLOVE SURG UNDER LTX SZ8 (GLOVE) ×4 IMPLANT
GOWN STRL REUS W/ TWL LRG LVL3 (GOWN DISPOSABLE) ×1 IMPLANT
GOWN STRL REUS W/ TWL XL LVL3 (GOWN DISPOSABLE) ×1 IMPLANT
GOWN STRL REUS W/TWL LRG LVL3 (GOWN DISPOSABLE) ×1
GOWN STRL REUS W/TWL XL LVL3 (GOWN DISPOSABLE) ×1
GRASPER SUT TROCAR 14GX15 (MISCELLANEOUS) ×2 IMPLANT
IRRIGATION STRYKERFLOW (MISCELLANEOUS) ×1 IMPLANT
IRRIGATOR STRYKERFLOW (MISCELLANEOUS) ×2
IV LACTATED RINGERS 1000ML (IV SOLUTION) ×2 IMPLANT
KIT PINK PAD W/HEAD ARE REST (MISCELLANEOUS) ×2
KIT PINK PAD W/HEAD ARM REST (MISCELLANEOUS) ×1 IMPLANT
LABEL OR SOLS (LABEL) ×2 IMPLANT
MANIFOLD NEPTUNE II (INSTRUMENTS) ×2 IMPLANT
NEEDLE VERESS 14GA 120MM (NEEDLE) ×2 IMPLANT
NS IRRIG 500ML POUR BTL (IV SOLUTION) ×2 IMPLANT
PACK GYN LAPAROSCOPIC (MISCELLANEOUS) ×2 IMPLANT
PAD PREP 24X41 OB/GYN DISP (PERSONAL CARE ITEMS) ×2 IMPLANT
POUCH SPECIMEN RETRIEVAL 10MM (ENDOMECHANICALS) IMPLANT
SCISSORS METZENBAUM CVD 33 (INSTRUMENTS) IMPLANT
SET TUBE SMOKE EVAC HIGH FLOW (TUBING) ×2 IMPLANT
SHEARS HARMONIC ACE PLUS 36CM (ENDOMECHANICALS) ×2 IMPLANT
SLEEVE ENDOPATH XCEL 5M (ENDOMECHANICALS) IMPLANT
SOL PREP PROV IODINE SCRUB 4OZ (MISCELLANEOUS) IMPLANT
SPONGE GAUZE 2X2 8PLY STRL LF (GAUZE/BANDAGES/DRESSINGS) IMPLANT
SUT VIC AB 2-0 UR6 27 (SUTURE) ×2 IMPLANT
SUT VIC AB 4-0 PS2 18 (SUTURE) IMPLANT
SYR 10ML LL (SYRINGE) ×2 IMPLANT
TROCAR ENDO BLADELESS 11MM (ENDOMECHANICALS) IMPLANT
TROCAR XCEL NON-BLD 5MMX100MML (ENDOMECHANICALS) ×2 IMPLANT

## 2021-04-16 NOTE — H&P (Signed)
Preoperative History and Physical  Alyssa Pearson is a 35 y.o. [id number] here for surgical management of right ectopic pregnancy.     She had a tubal reversal surgery in 2017 with 3 prior NSVD many years prior to that.  She had n ectopic pregnancy in 2018 treated w surgery.  She had a SAb in 2020.  She found out 6 weeks or so ago she was pregnant.  She has had serial hCG levels at first in office, with appropriate increase noted.  First 204, then 659, then 1853.  That was 9 days ago w last lab.  Today it is 7624.  However ultrasound shows no IUP and right ectopic w fetal pole and even FHTs there 150s.   Pt presented with pain in chest and then abdomen, no bleeding.  She has had SOB, urinary hesitancy, nausea, loss of appetite.  Since in ER, she has been on O2 and continues w SOB.  A diagnosis of sepsis real;ted to UTI has been made and she has been started on ABX.    Proposed surgery: Laparoscopy and salpingectomy (right).  Past Medical History:  Diagnosis Date  . Anemia   . Anxiety    h/o   . Arthritis    bil feet  . Depression    h/o  . GERD (gastroesophageal reflux disease)    h/o  . Headache    migraines  . Hypertension   . Vertigo    Past Surgical History:  Procedure Laterality Date  . CHROMOPERTUBATION N/A 09/14/2019   Procedure: CHROMOPERTUBATION;  Surgeon: Natale Milch, MD;  Location: [facility];  Service: Gynecology;  Laterality: N/A;  . DILATION AND EVACUATION N/A 12/19/2019   Procedure: DILATATION AND EVACUATION;  Surgeon: Natale Milch, MD;  Location: [facility];  Service: Gynecology;  Laterality: N/A;  . HYSTEROSCOPY WITH D & C N/A 09/14/2019   Procedure: DILATATION AND CURETTAGE /HYSTEROSCOPY, POLYPECTOMY;  Surgeon: Natale Milch, MD;  Location: [facility];  Service: Gynecology;  Laterality: N/A;  . LAPAROSCOPY N/A 09/14/2019   Procedure: LAPAROSCOPY DIAGNOSTIC;  Surgeon: Natale Milch, MD;  Location: [facility];  Service: Gynecology;   Laterality: N/A;  . TUBAL LIGATION    . Tubal reversal  08/31/2017  . WISDOM TOOTH EXTRACTION     OB History  Gravida Para Term Preterm AB Living  8 3 3   3 3   SAB IAB Ectopic Multiple Live Births  3       3    # Outcome Date GA Lbr Len/2nd Weight Sex Delivery Anes PTL Lv  8 Current           7 SAB 12/19/19 [redacted]w[redacted]d         6 Term 11/04/09 [redacted]w[redacted]d  3459 g M Vag-Spont   LIV  5 Term 02/19/08 [redacted]w[redacted]d  3232 g F Vag-Spont   LIV  4 SAB 10/21/06          3 Term 04/30/05 [redacted]w[redacted]d  2778 g F Vag-Spont   LIV  2 SAB 09/20/02          1 Gravida           Patient denies any other pertinent gynecologic issues.   No current facility-administered medications on file prior to encounter.   Current Outpatient Medications on File Prior to Encounter  Medication Sig Dispense Refill  . amLODipine (NORVASC) 5 MG tablet TAKE 1 TABLET BY MOUTH EVERY DAY (Patient taking differently: Take 5 mg by mouth daily.) 30 tablet 3  .  ascorbic acid (VITAMIN C) 500 MG tablet Take 500 mg by mouth daily.    . Prenatal Vit-Fe Fumarate-FA (MULTIVITAMIN-PRENATAL) 27-0.8 MG TABS tablet Take 1 tablet by mouth daily.     . progesterone (PROMETRIUM) 200 MG capsule Take 1 capsule (200 mg total) by mouth daily. (Patient taking differently: Take 200 mg by mouth at bedtime.) 30 capsule 3  . metFORMIN (GLUCOPHAGE XR) 500 MG 24 hr tablet Take 1 tablet (500 mg total) by mouth daily with breakfast. (Patient not taking: No sig reported) 30 tablet 11  . predniSONE (DELTASONE) 10 MG tablet Take 2 tablets (20 mg total) by mouth 2 (two) times daily. (Patient not taking: No sig reported) 20 tablet 0  . traMADol (ULTRAM) 50 MG tablet Take 1 tablet (50 mg total) by mouth every 6 (six) hours as needed. (Patient not taking: No sig reported) 15 tablet 0   No Known Allergies   reports that she quit smoking about 11 years ago. Her smoking use included cigarettes. She has a 3.50 pack-year smoking history. She has never used smokeless tobacco. She reports that  she does not drink alcohol and does not use drugs. Family History  Problem Relation Age of Onset  . Hypertension Mother   . Diabetes Father   . Hypertension Father     Review of Systems: Noncontributory  PHYSICAL EXAM: Blood pressure 140/86, pulse (!) 108, temperature 98 F (36.7 C), resp. rate (!) 23, last menstrual period 03/03/2021, SpO2 100 %. General appearance - alert, well appearing, and in no distress Chest - clear to auscultation, no wheezes, rales or rhonchi, symmetric air entry Heart - normal rate and regular rhythm Abdomen - soft, nontender, nondistended, no masses or organomegaly Pelvic - examination not indicated Extremities - peripheral pulses normal, no pedal edema, no clubbing or cyanosis  Labs: Results for orders placed or performed during the hospital encounter of 04/16/21 (from the past 336 hour(s))  Lipase, blood   Collection Time: 04/16/21  2:30 PM  Result Value Ref Range   Lipase 24 11 - 51 U/L  Comprehensive metabolic panel   Collection Time: 04/16/21  2:30 PM  Result Value Ref Range   Sodium 134 (L) 135 - 145 mmol/L   Potassium 4.0 3.5 - 5.1 mmol/L   Chloride 101 98 - 111 mmol/L   CO2 22 22 - 32 mmol/L   Glucose, Bld 167 (H) 70 - 99 mg/dL   BUN 10 6 - 20 mg/dL   Creatinine, Ser 1.61 0.44 - 1.00 mg/dL   Calcium 9.1 8.9 - 09.6 mg/dL   Total Protein 7.8 6.5 - 8.1 g/dL   Albumin 4.0 3.5 - 5.0 g/dL   AST 22 15 - 41 U/L   ALT 11 0 - 44 U/L   Alkaline Phosphatase 65 38 - 126 U/L   Total Bilirubin 0.7 0.3 - 1.2 mg/dL   GFR, Estimated >04 >54 mL/min   Anion gap 11 5 - 15  CBC   Collection Time: 04/16/21  2:30 PM  Result Value Ref Range   WBC 22.2 (H) 4.0 - 10.5 K/uL   RBC 3.26 (L) 3.87 - 5.11 MIL/uL   Hemoglobin 9.3 (L) 12.0 - 15.0 g/dL   HCT 09.8 (L) 11.9 - 14.7 %   MCV 86.2 80.0 - 100.0 fL   MCH 28.5 26.0 - 34.0 pg   MCHC 33.1 30.0 - 36.0 g/dL   RDW 82.9 (H) 56.2 - 13.0 %   Platelets 344 150 - 400 K/uL   nRBC 0.0  0.0 - 0.2 %  Urinalysis,  Complete w Microscopic   Collection Time: 04/16/21  2:30 PM  Result Value Ref Range   Color, Urine AMBER (A) YELLOW   APPearance CLOUDY (A) CLEAR   Specific Gravity, Urine 1.018 1.005 - 1.030   pH 5.0 5.0 - 8.0   Glucose, UA NEGATIVE NEGATIVE mg/dL   Hgb urine dipstick NEGATIVE NEGATIVE   Bilirubin Urine NEGATIVE NEGATIVE   Ketones, ur 5 (A) NEGATIVE mg/dL   Protein, ur 30 (A) NEGATIVE mg/dL   Nitrite NEGATIVE NEGATIVE   Leukocytes,Ua MODERATE (A) NEGATIVE   RBC / HPF 6-10 0 - 5 RBC/hpf   WBC, UA 21-50 0 - 5 WBC/hpf   Bacteria, UA MANY (A) NONE SEEN   Squamous Epithelial / LPF 21-50 0 - 5   Mucus PRESENT   hCG, quantitative, pregnancy   Collection Time: 04/16/21  2:30 PM  Result Value Ref Range   hCG, Beta Chain, Quant, S 7,624 (H) <5 mIU/mL  Lactic acid, plasma   Collection Time: 04/16/21  3:12 PM  Result Value Ref Range   Lactic Acid, Venous 3.8 (HH) 0.5 - 1.9 mmol/L  Protime-INR   Collection Time: 04/16/21  3:12 PM  Result Value Ref Range   Prothrombin Time 13.5 11.4 - 15.2 seconds   INR 1.0 0.8 - 1.2  APTT   Collection Time: 04/16/21  3:12 PM  Result Value Ref Range   aPTT 25 24 - 36 seconds  Lactic acid, plasma   Collection Time: 04/16/21  5:29 PM  Result Value Ref Range   Lactic Acid, Venous 2.1 (HH) 0.5 - 1.9 mmol/L  ABO/Rh   Collection Time: 04/16/21  5:29 PM  Result Value Ref Range   ABO/RH(D) PENDING   D-dimer, quantitative   Collection Time: 04/16/21  6:17 PM  Result Value Ref Range   D-Dimer, Quant 6.06 (H) 0.00 - 0.50 ug/mL-FEU  Results for orders placed or performed in visit on 04/04/21 (from the past 336 hour(s))  Beta hCG quant (ref lab)   Collection Time: 04/04/21 11:54 AM  Result Value Ref Range   hCG Quant 659 mIU/mL  Results for orders placed or performed in visit on 04/03/21 (from the past 336 hour(s))  Beta hCG quant (ref lab)   Collection Time: 04/07/21 11:38 AM  Result Value Ref Range   hCG Quant 1,853 mIU/mL  Results for orders placed  or performed in visit on 04/03/21 (from the past 336 hour(s))  Urine Culture   Collection Time: 04/03/21  2:00 PM   Specimen: Urine   UR  Result Value Ref Range   Urine Culture, Routine Final report    Organism ID, Bacteria Lactobacillus species   Cytology - PAP   Collection Time: 04/03/21  6:42 PM  Result Value Ref Range   High risk HPV Negative    Neisseria Gonorrhea Negative    Chlamydia Negative    Trichomonas Negative    Adequacy      Satisfactory for evaluation; transformation zone component PRESENT.   Diagnosis      - Negative for intraepithelial lesion or malignancy (NILM)   Microorganisms      Fungal organisms present consistent with Candida spp.   Comment Normal Reference Range HPV - Negative    Comment Normal Reference Range Trichomonas - Negative    Comment Normal Reference Ranger Chlamydia - Negative    Comment      Normal Reference Range Neisseria Gonorrhea - Negative    [facility]aging Studies: [facility]  [facility]  Result Date: 04/16/2021 CLINICAL DATA:  Questionable sepsis. EXAM: PORTABLE CHEST 1 VIEW COMPARISON:  August 28, 2011 FINDINGS: The heart size and mediastinal contours are within normal limits. Both lungs are clear. The visualized skeletal structures are unremarkable. IMPRESSION: No active disease. Electronically Signed   By: Ted Mcalpine M.D.   On: 04/16/2021 16:00   US OB LESS THAN 14 WEEKS WITH OB TRANSVAGINAL  Result Date: 04/16/2021 CLINICAL DATA:  Pregnant patient in first-trimester pregnancy with abdominal pain. Gestational age by LMP 6 weeks 2 days. EXAM: OBSTETRIC <14 WK [place] AND TRANSVAGINAL OB US TECHNIQUE: Both transabdominal and transvaginal ultrasound examinations were performed for complete evaluation of the gestation as well as the maternal uterus, adnexal regions, and pelvic cul-de-sac. Transvaginal technique was performed to assess early pregnancy. COMPARISON:  None. FINDINGS: Intrauterine gestational sac: No intrauterine gestational  sac. There is an ectopic right adnexal pregnancy with gestational sac in the adnexa. Yolk sac:  Not Visualized. Embryo:  Present in the right adnexal ectopic. Cardiac Activity: Present Heart Rate: 125 bpm CRL:  5.5 mm   6 w   2 d Subchorionic hemorrhage:  None visualized. Maternal uterus/adnexae: There is no intrauterine pregnancy. The endometrium is thickened at 3 cm. There is a live right adnexal ectopic with gestational sac and fetal pole, fetal heart rate of 125 beats per minute. This appears adjacent to the right ovary. Overall size measures 8.7 x 3.6 cm. There is moderate complex free fluid in the right adnexa, with trace free fluid tracking into the right upper quadrant. The left ovary is normal. IMPRESSION: 1. Right adnexal ectopic pregnancy adjacent to the right ovary. There is an adnexal gestational sac containing a fetal pole with positive fetal heart tones, overall adnexal mass measures 8.7 x 3.6 cm. Moderate amount of free fluid in the pelvis, with small amount of free fluid tracking into the right pericolic gutter. 2. No intrauterine pregnancy. Critical Value/emergent results were called by telephone at the time of interpretation on 04/16/2021 at 5:00 pm to provider [facility] , who verbally acknowledged these results. Electronically Signed   By: Narda Rutherford M.D.   On: 04/16/2021 17:00    Assessment: Right Ectopic Pregnancy Not a candidate for MTX therapy Prior tubal surgeries, risks of adhesions and loss of tube counseled Risk of infertility discussed. Post surgery monitor for sepsis and need for continued ABX.  Plan: Patient will undergo surgical management with Laparoscopy and right salpingectomy.   The risks of surgery were discussed in detail with the patient including but not limited to: bleeding which may require transfusion or reoperation; infection which may require antibiotics; injury to surrounding organs which may involve bowel, bladder, ureters ; need for additional  procedures including laparoscopy or laparotomy; thromboembolic phenomenon, surgical site problems and other postoperative/anesthesia complications. Likelihood of success in alleviating the patient's condition was discussed. Routine postoperative instructions will be reviewed with the patient and her family in detail after surgery.  The patient concurred with the proposed plan, giving informed written consent for the surgery.  Patient has been NPO since last night she will remain NPO for procedure.  Anesthesia and OR aware.  Preoperative prophylactic antibiotics and SCDs ordered on call to the OR.  To OR when ready.  Annamarie Major, MD, [name], [facility] 04/16/2021  6:53 PM

## 2021-04-16 NOTE — Op Note (Signed)
Alyssa Pearson   PROCEDURE DATE: 04/16/2021  PREOPERATIVE DIAGNOSIS: Ruptured right ectopic pregnancy POSTOPERATIVE DIAGNOSIS: Ruptured right fallopian tube ectopic pregnancy PROCEDURE: Laparoscopic right salpingectomy and removal of ectopic pregnancy SURGEON:  Dr. Annamarie Major ANESTHESIOLOGIST: Yves Dill, MD  ANES: General  INDICATIONS: 35 y.o. [phone] at [redacted]w[redacted]d here with the preoperative diagnoses as listed above.  Please refer to preoperative notes for more details. Patient was counseled regarding need for laparoscopic salpingectomy. Risks of surgery including bleeding which may require transfusion or reoperation, infection, injury to bowel or other surrounding organs, need for additional procedures including laparotomy and other postoperative/anesthesia complications were explained to patient.  Written informed consent was obtained.  FINDINGS:  Moderate amount of hemoperitoneum estimated to be about 700 mL of blood and clots.  Dilated right fallopian tube containing ectopic gestation. Small normal appearing uterus, normal left fallopian tube (although evidence for prior surgery on it as well), right ovary and left ovary.  ANESTHESIA: General ESTIMATED BLOOD LOSS: 830 ml SPECIMENS: Right fallopian tube containing ectopic gestation COMPLICATIONS: None immediate  PROCEDURE IN DETAIL:  The patient was taken to the operating room where general anesthesia was administered and was found to be adequate.  She was placed in the dorsal lithotomy position, and was prepped and draped in a sterile manner.  A Foley catheter was inserted into her bladder and attached to constant drainage and a sponge stick was placed vaginally for any manipulation purposes .  After an adequate timeout was performed, attention was then turned to the patient's abdomen where a 5-mm skin incision was made on the umbilical fold.    After an adequate timeout was performed, attention was turned to the abdomen where an  umbilical incision was made with the scalpel.  Veress needle is placed with confirmation using the hanging drop technique.   The abdomen was then insufflated with carbon dioxide gas and adequate pneumoperitoneum was obtained.  The 5-mm trocar and sleeve were then advanced without difficulty with the laparoscope under direct visualization into the abdomen.   A survey of the patient's pelvis and abdomen revealed the findings above.  A 5-mm right lower quadrant port was then placed under direct visualization. Additional 11-mm trocar placed in the LLQ lateral to the inferior epigastric blood vessels. The suction irrigator was then used to suction the hemoperitoneum and irrigate the pelvis.  Attention was then turned to the right fallopian tube which was grasped and dissected/ ligated from the underlying mesosalpinx and uterine attachment using the Harmonic instrument.  Good hemostasis was noted.  The specimen was removed from the abdomen intact.  The abdomen was desufflated, and all instruments were removed.  All skin incisions were closed Dermabond. The patient tolerated the procedure well.  All instruments, needles, and sponge counts were correct x 2. The patient was taken to the recovery room in stable condition.   The patient will be discharged to home as per PACU criteria.  Routine postoperative instructions given.   Annamarie Major, MD, [name], [facility] 04/16/2021  9:00 PM

## 2021-04-16 NOTE — Anesthesia Preprocedure Evaluation (Signed)
Anesthesia Evaluation  Patient identified by MRN, date of birth, ID band Patient awake    Reviewed: Allergy & Precautions, NPO status , Patient's Chart, lab work & pertinent test results  Airway Mallampati: II  TM Distance: >3 FB Neck ROM: full    Dental  (+) Teeth Intact, Dental Advisory Given Upper/lower braces:   Pulmonary former smoker,    Pulmonary exam normal        Cardiovascular hypertension, Normal cardiovascular exam     Neuro/Psych  Headaches, PSYCHIATRIC DISORDERS Anxiety Depression    GI/Hepatic Neg liver ROS, GERD  Controlled,  Endo/Other    Renal/GU negative Renal ROS  Female GU complaint     Musculoskeletal  (+) Arthritis ,   Abdominal   Peds negative pediatric ROS (+)  Hematology  (+) Blood dyscrasia, anemia ,   Anesthesia Other Findings   Reproductive/Obstetrics                             Anesthesia Physical  Anesthesia Plan  ASA: II and emergent  Anesthesia Plan: General   Post-op Pain Management:    Induction: Intravenous, Rapid sequence and Cricoid pressure planned  PONV Risk Score and Plan:   Airway Management Planned: Oral ETT  Additional Equipment:   Intra-op Plan:   Post-operative Plan: Extubation in OR  Informed Consent: I have reviewed the patients History and Physical, chart, labs and discussed the procedure including the risks, benefits and alternatives for the proposed anesthesia with the patient or authorized representative who has indicated his/her understanding and acceptance.       Plan Discussed with: CRNA and Surgeon  Anesthesia Plan Comments:         Anesthesia Quick Evaluation

## 2021-04-16 NOTE — Sepsis Progress Note (Signed)
Sepsis protocol is being monitored by eLink. 

## 2021-04-16 NOTE — ED Triage Notes (Addendum)
Pt comes with c/o abdominal pain and pain when urinating. Pt states she is [redacted] weeks pregnant. Pt denies any vaginal bleeding or cramping.g  Pt states last week she has some burning with urination but subsided. Pt states it started again this week and has gotten worse.  Pt states she was dx with yeast infection last week nd took one dose med for it.

## 2021-04-16 NOTE — Anesthesia Procedure Notes (Signed)
Procedure Name: Intubation Date/Time: 04/16/2021 7:42 PM Performed by: Elmarie Mainland, CRNA Pre-anesthesia Checklist: Patient identified, Emergency Drugs available, Suction available and Patient being monitored Patient Re-evaluated:Patient Re-evaluated prior to induction Oxygen Delivery Method: Circle system utilized Preoxygenation: Pre-oxygenation with 100% oxygen Induction Type: IV induction and Rapid sequence Laryngoscope Size: McGraph and 3 Grade View: Grade I Tube type: Oral Tube size: 7.0 mm Number of attempts: 1 Airway Equipment and Method: Stylet,  Oral airway and Video-laryngoscopy Placement Confirmation: ETT inserted through vocal cords under direct vision,  positive ETCO2 and breath sounds checked- equal and bilateral Secured at: 22 cm Tube secured with: Tape Dental Injury: Teeth and Oropharynx as per pre-operative assessment

## 2021-04-16 NOTE — Transfer of Care (Signed)
Immediate Anesthesia Transfer of Care Note  Patient: Alyssa Pearson  Procedure(s) Performed: DIAGNOSTIC LAPAROSCOPY WITH REMOVAL OF RUPTURED ECTOPIC PREGNANCY (Right ) LAPAROSCOPIC UNILATERAL SALPINGECTOMY (Right )  Patient Location: PACU  Anesthesia Type:General  Level of Consciousness: awake, drowsy and patient cooperative  Airway & Oxygen Therapy: Patient Spontanous Breathing and Patient connected to face mask oxygen  Post-op Assessment: Vital signs stable  Post vital signs: Reviewed and stable  Last Vitals:  Vitals Value Taken Time  BP 105/54 04/16/21 2105  Temp    Pulse 107 04/16/21 2108  Resp 29 04/16/21 2108  SpO2 100 % 04/16/21 2108  Vitals shown include unvalidated device data.  Last Pain:  Vitals:   04/16/21 1600  PainSc: 6          Complications: No complications documented.

## 2021-04-16 NOTE — ED Provider Notes (Signed)
[facility] Emergency Department Provider Note  ____________________________________________   Event Date/Time   First MD Initiated Contact with Patient 04/16/21 1429     (approximate)  I have reviewed the triage vital signs and the nursing notes.   HISTORY  Chief Complaint Abdominal Pain   HPI Alyssa Pearson is a 35 y.o. female [phone] with a past medical history of anemia,, migraine headaches, GERD, HTN and vertigo who presents for assessment of abdominal pain urinary hesitancy shortness of breath malaise decreased appetite some nausea that initially started last night but got much worse earlier today.  She denies any diarrhea, abnormal vaginal bleeding or discharge, back pain chest pain, cough, rash or extremity pain.  No recent trauma or injuries.  She denies taking any current medications.  No EtOH or illicit drug use.  She has never had similar constellation of symptoms with prior pregnancies.  She is currently under ultrasound with pregnancy.         Past Medical History:  Diagnosis Date  . Anemia   . Anxiety    h/o   . Arthritis    bil feet  . Depression    h/o  . GERD (gastroesophageal reflux disease)    h/o  . Headache    migraines  . Hypertension   . Vertigo     Patient Active Problem List   Diagnosis Date Noted  . Supervision of other normal pregnancy, antepartum 04/03/2021  . Hypertension 08/05/2020  . Pre-diabetes 07/10/2020  . Finger pain, left 07/10/2020  . Stye 07/10/2020  . Nausea and vomiting in adult 04/30/2020  . Elevated troponin 04/30/2020  . Depression 04/30/2020  . History of reversal of tubal ligation 09/17/2017    Past Surgical History:  Procedure Laterality Date  . CHROMOPERTUBATION N/A 09/14/2019   Procedure: CHROMOPERTUBATION;  Surgeon: Natale Milch, MD;  Location: [facility] ORS;  Service: Gynecology;  Laterality: N/A;  . DILATION AND EVACUATION N/A 12/19/2019   Procedure: DILATATION AND  EVACUATION;  Surgeon: Natale Milch, MD;  Location: [facility] ORS;  Service: Gynecology;  Laterality: N/A;  . HYSTEROSCOPY WITH D & C N/A 09/14/2019   Procedure: DILATATION AND CURETTAGE /HYSTEROSCOPY, POLYPECTOMY;  Surgeon: Natale Milch, MD;  Location: [facility] ORS;  Service: Gynecology;  Laterality: N/A;  . LAPAROSCOPY N/A 09/14/2019   Procedure: LAPAROSCOPY DIAGNOSTIC;  Surgeon: Natale Milch, MD;  Location: [facility] ORS;  Service: Gynecology;  Laterality: N/A;  . TUBAL LIGATION    . Tubal reversal  08/31/2017  . WISDOM TOOTH EXTRACTION      Prior to Admission medications   Medication Sig Start Date End Date Taking? Authorizing Provider  acetaminophen (TYLENOL) 500 MG tablet Take 2 tablets (1,000 mg total) by mouth every 6 (six) hours as needed for moderate pain or headache. Patient not taking: Reported on 04/03/2021 12/19/19   Adelene Idler R, MD  amLODipine (NORVASC) 5 MG tablet TAKE 1 TABLET BY MOUTH EVERY DAY Patient not taking: Reported on 04/03/2021 12/16/20   Natale Milch, MD  ascorbic acid (VITAMIN C) 500 MG tablet Take 500 mg by mouth daily. Patient not taking: Reported on 04/03/2021    [provider]  HYDROcodone-acetaminophen (NORCO/VICODIN) 5-325 MG tablet Take 1 tablet by mouth every 4 (four) hours as needed. Patient not taking: Reported on 04/03/2021 06/30/20   Jacalyn Lefevre, MD  meclizine (ANTIVERT) 25 MG tablet Take 25 mg by mouth as needed for dizziness. Patient not taking: Reported on 04/03/2021    [provider]  metFORMIN (GLUCOPHAGE XR) 500 MG 24 hr tablet Take 1 tablet (500 mg total) by mouth daily with breakfast. Patient not taking: Reported on 04/03/2021 07/08/20 07/08/21  Verdene Lennert, MD  ondansetron (ZOFRAN ODT) 4 MG disintegrating tablet Take 1 tablet (4 mg total) by mouth every 8 (eight) hours as needed for nausea or vomiting. Patient not taking: Reported on 04/03/2021 09/18/20   Eliezer Bottom, MD  predniSONE (DELTASONE) 10  MG tablet Take 2 tablets (20 mg total) by mouth 2 (two) times daily. Patient not taking: Reported on 04/03/2021 03/27/21   Geoffery Lyons, MD  Prenatal Vit-Fe Fumarate-FA (PRENATAL PO) Take 1 tablet by mouth daily.     [provider]  progesterone (PROMETRIUM) 200 MG capsule Take 1 capsule (200 mg total) by mouth daily. Patient not taking: Reported on 04/03/2021 04/03/21   Natale Milch, MD  traMADol (ULTRAM) 50 MG tablet Take 1 tablet (50 mg total) by mouth every 6 (six) hours as needed. Patient not taking: Reported on 04/03/2021 03/27/21   Geoffery Lyons, MD    Allergies Patient has no known allergies.  Family History  Problem Relation Age of Onset  . Hypertension Mother   . Diabetes Father   . Hypertension Father     Social History Social History   Tobacco Use  . Smoking status: Former Smoker    Packs/day: 0.50    Years: 7.00    Pack years: 3.50    Types: Cigarettes    Quit date: 09/10/2009    Years since quitting: 11.6  . Smokeless tobacco: Never Used  Vaping Use  . Vaping Use: Never used  Substance Use Topics  . Alcohol use: No  . Drug use: No    Review of Systems  Review of Systems  Constitutional: Positive for chills and malaise/fatigue. Negative for fever.  HENT: Negative for sore throat.   Eyes: Negative for pain.  Respiratory: Positive for shortness of breath. Negative for cough and stridor.   Cardiovascular: Negative for chest pain.  Gastrointestinal: Positive for abdominal pain and constipation. Negative for melena and vomiting.  Genitourinary: Positive for urgency.  Musculoskeletal: Negative for myalgias.  Skin: Negative for rash.  Neurological: Negative for seizures, loss of consciousness and headaches.  Psychiatric/Behavioral: Negative for suicidal ideas.  All other systems reviewed and are negative.     ____________________________________________   PHYSICAL EXAM:  VITAL SIGNS: ED Triage Vitals  Enc Vitals Group     BP 04/16/21  1426 (!) 97/49     Pulse Rate 04/16/21 1426 (!) 102     Resp 04/16/21 1426 19     Temp 04/16/21 1426 98 F (36.7 C)     Temp src --      SpO2 04/16/21 1426 99 %     Weight --      Height --      Head Circumference --      Peak Flow --      Pain Score 04/16/21 1423 10     Pain Loc --      Pain Edu? --      Excl. in GC? --    Vitals:   04/16/21 1528 04/16/21 1530  BP: 126/75 105/89  Pulse: 94 92  Resp: (!) 33 (!) 24  Temp:    SpO2: 100% 100%   Physical Exam Vitals and nursing note reviewed.  Constitutional:      General: She is not in acute distress.    Appearance: She is well-developed.  HENT:  Head: Normocephalic and atraumatic.     Right Ear: External ear normal.     Left Ear: External ear normal.     Nose: Nose normal.     Mouth/Throat:     Mouth: Mucous membranes are dry.  Eyes:     Conjunctiva/sclera: Conjunctivae normal.  Cardiovascular:     Rate and Rhythm: Normal rate and regular rhythm.     Heart sounds: No murmur heard.   Pulmonary:     Effort: Pulmonary effort is normal. No respiratory distress.     Breath sounds: Normal breath sounds.  Abdominal:     Palpations: Abdomen is soft.     Tenderness: There is generalized abdominal tenderness. There is no right CVA tenderness or left CVA tenderness.  Musculoskeletal:     Cervical back: Neck supple.  Skin:    General: Skin is warm and dry.     Capillary Refill: Capillary refill takes 2 to 3 seconds.  Neurological:     Mental Status: She is alert and oriented to person, place, and time.  Psychiatric:        Mood and Affect: Mood normal.     Clearly gravid abdomen. ____________________________________________   LABS (all labs ordered are listed, but only abnormal results are displayed)  Labs Reviewed  COMPREHENSIVE METABOLIC PANEL - Abnormal; Notable for the following components:      Result Value   Sodium 134 (*)    Glucose, Bld 167 (*)    All other components within normal limits  CBC -  Abnormal; Notable for the following components:   WBC 22.2 (*)    RBC 3.26 (*)    Hemoglobin 9.3 (*)    HCT 28.1 (*)    RDW 15.7 (*)    All other components within normal limits  URINALYSIS, COMPLETE (UACMP) WITH MICROSCOPIC - Abnormal; Notable for the following components:   Color, Urine AMBER (*)    APPearance CLOUDY (*)    Ketones, ur 5 (*)    Protein, ur 30 (*)    Leukocytes,Ua MODERATE (*)    Bacteria, UA MANY (*)    All other components within normal limits  HCG, QUANTITATIVE, PREGNANCY - Abnormal; Notable for the following components:   hCG, Beta Chain, Quant, S 7,624 (*)    All other components within normal limits  LACTIC ACID, PLASMA - Abnormal; Notable for the following components:   Lactic Acid, Venous 3.8 (*)    All other components within normal limits  CULTURE, BLOOD (SINGLE)  URINE CULTURE  LIPASE, BLOOD  PROTIME-INR  APTT  LACTIC ACID, PLASMA  ABO/RH   ____________________________________________  EKG  Sinus rhythm with ventricular of 91, normal axis, unremarkable intervals without clearance of acute ischemia. ____________________________________________  RADIOLOGY  ED MD interpretation: Chest x-ray shows no clear focal consolidation, effusion, significant IMA, pneumothorax or other clear acute thoracic process.  Ultrasound shows right-sided ectopic with fetal heart tones.  No IUP visualized.  Official radiology report(s): DG Chest Port 1 View  Result Date: 04/16/2021 CLINICAL DATA:  Questionable sepsis. EXAM: PORTABLE CHEST 1 VIEW COMPARISON:  August 28, 2011 FINDINGS: The heart size and mediastinal contours are within normal limits. Both lungs are clear. The visualized skeletal structures are unremarkable. IMPRESSION: No active disease. Electronically Signed   By: Ted Mcalpine M.D.   On: 04/16/2021 16:00   US OB LESS THAN 14 WEEKS WITH OB TRANSVAGINAL  Result Date: 04/16/2021 CLINICAL DATA:  Pregnant patient in first-trimester pregnancy with  abdominal pain. Gestational age by LMP  6 weeks 2 days. EXAM: OBSTETRIC <14 WK [place] AND TRANSVAGINAL OB US TECHNIQUE: Both transabdominal and transvaginal ultrasound examinations were performed for complete evaluation of the gestation as well as the maternal uterus, adnexal regions, and pelvic cul-de-sac. Transvaginal technique was performed to assess early pregnancy. COMPARISON:  None. FINDINGS: Intrauterine gestational sac: No intrauterine gestational sac. There is an ectopic right adnexal pregnancy with gestational sac in the adnexa. Yolk sac:  Not Visualized. Embryo:  Present in the right adnexal ectopic. Cardiac Activity: Present Heart Rate: 125 bpm CRL:  5.5 mm   6 w   2 d Subchorionic hemorrhage:  None visualized. Maternal uterus/adnexae: There is no intrauterine pregnancy. The endometrium is thickened at 3 cm. There is a live right adnexal ectopic with gestational sac and fetal pole, fetal heart rate of 125 beats per minute. This appears adjacent to the right ovary. Overall size measures 8.7 x 3.6 cm. There is moderate complex free fluid in the right adnexa, with trace free fluid tracking into the right upper quadrant. The left ovary is normal. IMPRESSION: 1. Right adnexal ectopic pregnancy adjacent to the right ovary. There is an adnexal gestational sac containing a fetal pole with positive fetal heart tones, overall adnexal mass measures 8.7 x 3.6 cm. Moderate amount of free fluid in the pelvis, with small amount of free fluid tracking into the right pericolic gutter. 2. No intrauterine pregnancy. Critical Value/emergent results were called by telephone at the time of interpretation on 04/16/2021 at 5:00 pm to provider [facility] , who verbally acknowledged these results. Electronically Signed   By: Narda Rutherford M.D.   On: 04/16/2021 17:00    ____________________________________________   PROCEDURES  Procedure(s) performed (including Critical Care):  .Critical Care Performed by: Gilles Chiquito, MD Authorized by: Gilles Chiquito, MD   Critical care provider statement:    Critical care time (minutes):  45   Critical care was necessary to treat or prevent imminent or life-threatening deterioration of the following conditions:  Sepsis   Critical care was time spent personally by me on the following activities:  Discussions with consultants, evaluation of patient's response to treatment, examination of patient, ordering and performing treatments and interventions, ordering and review of laboratory studies, ordering and review of radiographic studies, pulse oximetry, re-evaluation of patient's condition, obtaining history from patient or surrogate and review of old charts     ____________________________________________   INITIAL IMPRESSION / ASSESSMENT AND PLAN / ED COURSE      Patient presents with above to history exam for abdominal pain urinary hesitancy myalgias shortness of breath and overall feeling bad.  He states is in the context of being proximately [redacted] weeks pregnant.  On arrival she is tachycardic at 102 and borderline hypotensive with a BP of 97/49 with otherwise stable vital signs on room air.  Differential includes cystitis, pyonephritis, kidney stone, ectopic pregnancy and appendicitis.  Chest x-ray shows no evidence of pneumonia or volume overload and SPO2 is 100% very low suspicion for PE at this time.  CMP remarkable for no significant electrolyte or metabolic derangements.  CBC with leukocytosis with WBC count of 22,000 and hemoglobin of 9.3 and normal platelets.  Lipase not consistent with acute pancreatitis.  Urine does appear grossly infected with moderate LE S 21-50 WBCs and many bacteria.  No CVA tenderness or fever at this time to suggest pyelonephritis.  hCG is 7624.  Lactic acid is elevated at 3.8.  Given tachycardia and borderline low blood pressure and leukocytosis  and concern for sepsis.  Suspect source is likely UTI.  Will obtain blood and urine  cultures and treat with Rocephin and start patient on fluid resuscitation.  Pelvic ultrasound remarkable for findings of ectopic on the right with fetal heart tones visible.  Discussed with on-call obstetrician Dr. Tiburcio PeaHarris who will take the patient to the OR for operative management and admit for further treatment of sepsis from UTI.  ____________________________________________   FINAL CLINICAL IMPRESSION(S) / ED DIAGNOSES  Final diagnoses:  Sepsis, due to unspecified organism, unspecified whether acute organ dysfunction present [facility](HCC)  Acute cystitis with hematuria  Ectopic fetus    Medications  cefTRIAXone (ROCEPHIN) 1 g in sodium chloride 0.9 % 100 mL IVPB (1 g Intravenous New Bag/Given 04/16/21 1646)  lactated ringers infusion (has no administration in time range)  lactated ringers bolus 1,000 mL (0 mLs Intravenous Paused 04/16/21 1646)  acetaminophen (TYLENOL) tablet 1,000 mg (1,000 mg Oral Given 04/16/21 1452)     ED Discharge Orders    None       Note:  This document was prepared using Dragon voice recognition software and may include unintentional dictation errors.   Gilles ChiquitoSmith, Alivya Wegman P, MD 04/16/21 [phone]

## 2021-04-16 NOTE — Progress Notes (Signed)
CODE SEPSIS - PHARMACY COMMUNICATION  **Broad Spectrum Antibiotics should be administered within 1 hour of Sepsis diagnosis**  Time Code Sepsis Called/Page Received: 1602  Antibiotics Ordered: Rocephin  Time of 1st antibiotic administration: 1646  Additional action taken by pharmacy: none  If necessary, Name of Provider/Nurse Contacted: n/a    Bettey Costa ,PharmD Clinical Pharmacist  04/16/2021  5:08 PM

## 2021-04-16 NOTE — ED Notes (Signed)
Pt transported to OR at this time  

## 2021-04-17 ENCOUNTER — Encounter: Payer: Medicaid Other | Admitting: Obstetrics and Gynecology

## 2021-04-17 ENCOUNTER — Encounter: Payer: Self-pay | Admitting: Obstetrics & Gynecology

## 2021-04-17 LAB — CBC WITH DIFFERENTIAL/PLATELET
Abs Immature Granulocytes: 0.06 10*3/uL (ref 0.00–0.07)
Basophils Absolute: 0 10*3/uL (ref 0.0–0.1)
Basophils Relative: 0 %
Eosinophils Absolute: 0 10*3/uL (ref 0.0–0.5)
Eosinophils Relative: 0 %
HCT: 19.3 % — ABNORMAL LOW (ref 36.0–46.0)
Hemoglobin: 6.6 g/dL — ABNORMAL LOW (ref 12.0–15.0)
Immature Granulocytes: 1 %
Lymphocytes Relative: 8 %
Lymphs Abs: 1.1 10*3/uL (ref 0.7–4.0)
MCH: 29.2 pg (ref 26.0–34.0)
MCHC: 34.2 g/dL (ref 30.0–36.0)
MCV: 85.4 fL (ref 80.0–100.0)
Monocytes Absolute: 0.2 10*3/uL (ref 0.1–1.0)
Monocytes Relative: 2 %
Neutro Abs: 11.3 10*3/uL — ABNORMAL HIGH (ref 1.7–7.7)
Neutrophils Relative %: 89 %
Platelets: 213 10*3/uL (ref 150–400)
RBC: 2.26 MIL/uL — ABNORMAL LOW (ref 3.87–5.11)
RDW: 15.9 % — ABNORMAL HIGH (ref 11.5–15.5)
WBC: 12.6 10*3/uL — ABNORMAL HIGH (ref 4.0–10.5)
nRBC: 0 % (ref 0.0–0.2)

## 2021-04-17 LAB — COMPREHENSIVE METABOLIC PANEL
ALT: 10 U/L (ref 0–44)
AST: 13 U/L — ABNORMAL LOW (ref 15–41)
Albumin: 3.3 g/dL — ABNORMAL LOW (ref 3.5–5.0)
Alkaline Phosphatase: 47 U/L (ref 38–126)
Anion gap: 8 (ref 5–15)
BUN: 8 mg/dL (ref 6–20)
CO2: 23 mmol/L (ref 22–32)
Calcium: 8.1 mg/dL — ABNORMAL LOW (ref 8.9–10.3)
Chloride: 103 mmol/L (ref 98–111)
Creatinine, Ser: 0.58 mg/dL (ref 0.44–1.00)
GFR, Estimated: >60 mL/min (ref >60)
Glucose, Bld: 153 mg/dL — ABNORMAL HIGH (ref 70–99)
Potassium: 4.1 mmol/L (ref 3.5–5.1)
Sodium: 134 mmol/L — ABNORMAL LOW (ref 135–145)
Total Bilirubin: 0.8 mg/dL (ref 0.3–1.2)
Total Protein: 6.1 g/dL — ABNORMAL LOW (ref 6.5–8.1)

## 2021-04-17 LAB — CBC
HCT: 22.6 % — ABNORMAL LOW (ref 36.0–46.0)
Hemoglobin: 7.7 g/dL — ABNORMAL LOW (ref 12.0–15.0)
MCH: 29.7 pg (ref 26.0–34.0)
MCHC: 34.1 g/dL (ref 30.0–36.0)
MCV: 87.3 fL (ref 80.0–100.0)
Platelets: 230 10*3/uL (ref 150–400)
RBC: 2.59 MIL/uL — ABNORMAL LOW (ref 3.87–5.11)
RDW: 15.5 % (ref 11.5–15.5)
WBC: 14.1 10*3/uL — ABNORMAL HIGH (ref 4.0–10.5)
nRBC: 0 % (ref 0.0–0.2)

## 2021-04-17 LAB — HCG, QUANTITATIVE, PREGNANCY: hCG, Beta Chain, Quant, S: 3053 m[IU]/mL — ABNORMAL HIGH (ref <5)

## 2021-04-17 LAB — PREPARE RBC (CROSSMATCH)

## 2021-04-17 LAB — LACTIC ACID, PLASMA: Lactic Acid, Venous: 1.1 mmol/L (ref 0.5–1.9)

## 2021-04-17 MED ORDER — SODIUM CHLORIDE 0.9% IV SOLUTION: Freq: Once | INTRAVENOUS | Status: AC

## 2021-04-17 MED ORDER — DIPHENHYDRAMINE HCL 50 MG/ML IJ SOLN
25.0000 mg | Freq: Once | INTRAMUSCULAR | Status: AC
  Administered 2021-04-17: 25 mg via INTRAVENOUS
  Filled 2021-04-17: qty 1

## 2021-04-17 NOTE — Progress Notes (Signed)
Discussed continued admission versus discharge. Patient has not yet ambulated. She states that she still feels quite weak. Pain is more or less controlled.  She is tolerating some PO and is voiding. Hemoglobin bump is appropriate.  Discussed that she is not quite meeting discharge criterai and between her sepsis, surgery, severe anemia with need for blood transfusion, she is not quite appropriate for discharge. She voiced agreement.  I encouraged her to ambulate with help, increasing gradually this evening. Consider discharge tomorrow. Recheck CBC in AM.   Thomasene Mohair, MD, [name], [facility] 04/17/2021 5:28 PM

## 2021-04-17 NOTE — Progress Notes (Signed)
1 Day Post-Op Procedure(s) (LRB): DIAGNOSTIC LAPAROSCOPY WITH REMOVAL OF RUPTURED ECTOPIC PREGNANCY (Right) LAPAROSCOPIC UNILATERAL SALPINGECTOMY (Right)  Subjective: Patient reports she is feeling better w some inc pain and also some weakness. No nausea, shortness of breath, or CP.  No vag bleeding..    Objective: I have reviewed patient's vital signs, intake and output, medications and labs. BP (!) 106/50 (BP Location: Right Arm)   Pulse 95   Temp 98.5 F (36.9 C) (Oral)   Resp (!) 22   LMP 03/03/2021 (Exact Date)   SpO2 98%  Abd: Min T, ND Incision: clean, dry and intact Extr: no calf T, no edema  Results for orders placed or performed during the hospital encounter of 04/16/21  Blood culture (routine single)   Specimen: BLOOD  Result Value Ref Range   Specimen Description BLOOD RIGHT ANTECUBITAL    Special Requests      BOTTLES DRAWN AEROBIC AND ANAEROBIC Blood Culture adequate volume   Culture      NO GROWTH < 24 HOURS Performed at [facility], [address] [place], [place] [address]    Report Status PENDING   Resp Panel by RT-PCR (Flu A&B, Covid) Nasopharyngeal Swab   Specimen: Nasopharyngeal Swab; Nasopharyngeal(NP) swabs in vial transport medium  Result Value Ref Range   SARS Coronavirus 2 by RT PCR NEGATIVE NEGATIVE   Influenza A by PCR NEGATIVE NEGATIVE   Influenza B by PCR NEGATIVE NEGATIVE  Lipase, blood  Result Value Ref Range   Lipase 24 11 - 51 U/L  Comprehensive metabolic panel  Result Value Ref Range   Sodium 134 (L) 135 - 145 mmol/L   Potassium 4.0 3.5 - 5.1 mmol/L   Chloride 101 98 - 111 mmol/L   CO2 22 22 - 32 mmol/L   Glucose, Bld 167 (H) 70 - 99 mg/dL   BUN 10 6 - 20 mg/dL   Creatinine, Ser 1.09 0.44 - 1.00 mg/dL   Calcium 9.1 8.9 - 32.3 mg/dL   Total Protein 7.8 6.5 - 8.1 g/dL   Albumin 4.0 3.5 - 5.0 g/dL   AST 22 15 - 41 U/L   ALT 11 0 - 44 U/L   Alkaline Phosphatase 65 38 - 126 U/L   Total Bilirubin 0.7 0.3 - 1.2 mg/dL    GFR, Estimated >55 >73 mL/min   Anion gap 11 5 - 15  CBC  Result Value Ref Range   WBC 22.2 (H) 4.0 - 10.5 K/uL   RBC 3.26 (L) 3.87 - 5.11 MIL/uL   Hemoglobin 9.3 (L) 12.0 - 15.0 g/dL   HCT 22.0 (L) 25.4 - 27.0 %   MCV 86.2 80.0 - 100.0 fL   MCH 28.5 26.0 - 34.0 pg   MCHC 33.1 30.0 - 36.0 g/dL   RDW 62.3 (H) 76.2 - 83.1 %   Platelets 344 150 - 400 K/uL   nRBC 0.0 0.0 - 0.2 %  Urinalysis, Complete w Microscopic  Result Value Ref Range   Color, Urine AMBER (A) YELLOW   APPearance CLOUDY (A) CLEAR   Specific Gravity, Urine 1.018 1.005 - 1.030   pH 5.0 5.0 - 8.0   Glucose, UA NEGATIVE NEGATIVE mg/dL   Hgb urine dipstick NEGATIVE NEGATIVE   Bilirubin Urine NEGATIVE NEGATIVE   Ketones, ur 5 (A) NEGATIVE mg/dL   Protein, ur 30 (A) NEGATIVE mg/dL   Nitrite NEGATIVE NEGATIVE   Leukocytes,Ua MODERATE (A) NEGATIVE   RBC / HPF 6-10 0 - 5 RBC/hpf   WBC, UA  21-50 0 - 5 WBC/hpf   Bacteria, UA MANY (A) NONE SEEN   Squamous Epithelial / LPF 21-50 0 - 5   Mucus PRESENT   hCG, quantitative, pregnancy  Result Value Ref Range   hCG, Beta Chain, Quant, S 7,624 (H) <5 mIU/mL  Lactic acid, plasma  Result Value Ref Range   Lactic Acid, Venous 3.8 (HH) 0.5 - 1.9 mmol/L  Lactic acid, plasma  Result Value Ref Range   Lactic Acid, Venous 2.1 (HH) 0.5 - 1.9 mmol/L  Protime-INR  Result Value Ref Range   Prothrombin Time 13.5 11.4 - 15.2 seconds   INR 1.0 0.8 - 1.2  APTT  Result Value Ref Range   aPTT 25 24 - 36 seconds  D-dimer, quantitative  Result Value Ref Range   D-Dimer, Quant 6.06 (H) 0.00 - 0.50 ug/mL-FEU  CBC  Result Value Ref Range   WBC 23.3 (H) 4.0 - 10.5 K/uL   RBC 2.79 (L) 3.87 - 5.11 MIL/uL   Hemoglobin 8.0 (L) 12.0 - 15.0 g/dL   HCT 34.1 (L) 93.7 - 90.2 %   MCV 90.7 80.0 - 100.0 fL   MCH 28.7 26.0 - 34.0 pg   MCHC 31.6 30.0 - 36.0 g/dL   RDW 40.9 (H) 73.5 - 32.9 %   Platelets 232 150 - 400 K/uL   nRBC 0.0 0.0 - 0.2 %  Lactic acid, plasma  Result Value Ref Range    Lactic Acid, Venous 3.4 (HH) 0.5 - 1.9 mmol/L  CBC with Differential/Platelet  Result Value Ref Range   WBC 12.6 (H) 4.0 - 10.5 K/uL   RBC 2.26 (L) 3.87 - 5.11 MIL/uL   Hemoglobin 6.6 (L) 12.0 - 15.0 g/dL   HCT 92.4 (L) 26.8 - 34.1 %   MCV 85.4 80.0 - 100.0 fL   MCH 29.2 26.0 - 34.0 pg   MCHC 34.2 30.0 - 36.0 g/dL   RDW 96.2 (H) 22.9 - 79.8 %   Platelets 213 150 - 400 K/uL   nRBC 0.0 0.0 - 0.2 %   Neutrophils Relative % 89 %   Neutro Abs 11.3 (H) 1.7 - 7.7 K/uL   Lymphocytes Relative 8 %   Lymphs Abs 1.1 0.7 - 4.0 K/uL   Monocytes Relative 2 %   Monocytes Absolute 0.2 0.1 - 1.0 K/uL   Eosinophils Relative 0 %   Eosinophils Absolute 0.0 0.0 - 0.5 K/uL   Basophils Relative 0 %   Basophils Absolute 0.0 0.0 - 0.1 K/uL   Immature Granulocytes 1 %   Abs Immature Granulocytes 0.06 0.00 - 0.07 K/uL  Comprehensive metabolic panel  Result Value Ref Range   Sodium 134 (L) 135 - 145 mmol/L   Potassium 4.1 3.5 - 5.1 mmol/L   Chloride 103 98 - 111 mmol/L   CO2 23 22 - 32 mmol/L   Glucose, Bld 153 (H) 70 - 99 mg/dL   BUN 8 6 - 20 mg/dL   Creatinine, Ser 9.21 0.44 - 1.00 mg/dL   Calcium 8.1 (L) 8.9 - 10.3 mg/dL   Total Protein 6.1 (L) 6.5 - 8.1 g/dL   Albumin 3.3 (L) 3.5 - 5.0 g/dL   AST 13 (L) 15 - 41 U/L   ALT 10 0 - 44 U/L   Alkaline Phosphatase 47 38 - 126 U/L   Total Bilirubin 0.8 0.3 - 1.2 mg/dL   GFR, Estimated >19 >41 mL/min   Anion gap 8 5 - 15  hCG, quantitative, pregnancy  Result Value Ref Range  hCG, Beta Chain, Quant, S 3,053 (H) <5 mIU/mL  ABO/Rh  Result Value Ref Range   ABO/RH(D) PENDING   ABO/Rh  Result Value Ref Range   ABO/RH(D)      A POS Performed at [facility], [address] [place], [place] [address]      Assessment: 1. Right ectopic pregnancy 2. Symptomatic anemia from blood loss 3. Sepsis, etiology likely UTI  s/p Procedure(s): DIAGNOSTIC LAPAROSCOPY WITH REMOVAL OF RUPTURED ECTOPIC PREGNANCY (Right) LAPAROSCOPIC UNILATERAL  SALPINGECTOMY (Right): stable  Plan: Advance diet Encourage ambulation Advance to PO medication Transfusion due to symptomatic anemia with a Hgb of 6.6.  Discussed pros and cons of transfusion w patient.  Monitor lactic acid and for any other signs of infection.  Continue ABX for now (UTI, uro)    WBC improved    Normal O2 sats Discharge planning later based on how she responds Would discharge w pain meds and po ABX for UTI (Keflex 500 mg QID)   LOS: 0 days    Alyssa Pearson 04/17/2021, 7:46 AM

## 2021-04-18 LAB — CBC
HCT: 20.9 % — ABNORMAL LOW (ref 36.0–46.0)
Hemoglobin: 6.9 g/dL — ABNORMAL LOW (ref 12.0–15.0)
MCH: 29 pg (ref 26.0–34.0)
MCHC: 33 g/dL (ref 30.0–36.0)
MCV: 87.8 fL (ref 80.0–100.0)
Platelets: 203 10*3/uL (ref 150–400)
RBC: 2.38 MIL/uL — ABNORMAL LOW (ref 3.87–5.11)
RDW: 16 % — ABNORMAL HIGH (ref 11.5–15.5)
WBC: 10.4 10*3/uL (ref 4.0–10.5)
nRBC: 0 % (ref 0.0–0.2)

## 2021-04-18 LAB — URINE CULTURE: Culture: 1000 — AB

## 2021-04-18 LAB — HEMOGLOBIN AND HEMATOCRIT, BLOOD
HCT: 27.1 % — ABNORMAL LOW (ref 36.0–46.0)
Hemoglobin: 9.2 g/dL — ABNORMAL LOW (ref 12.0–15.0)

## 2021-04-18 LAB — PREPARE RBC (CROSSMATCH)

## 2021-04-18 LAB — SURGICAL PATHOLOGY

## 2021-04-18 MED ORDER — OXYCODONE-ACETAMINOPHEN 5-325 MG PO TABS: 1.0000 | ORAL_TABLET | ORAL | 0 refills | Status: DC | PRN

## 2021-04-18 MED ORDER — CEPHALEXIN 500 MG PO CAPS: 500.0000 mg | ORAL_CAPSULE | Freq: Four times a day (QID) | ORAL | 0 refills | Status: AC

## 2021-04-18 MED ORDER — SODIUM CHLORIDE 0.9% IV SOLUTION: Freq: Once | INTRAVENOUS | Status: AC

## 2021-04-18 NOTE — Discharge Summary (Signed)
Gynecology Physician Postoperative Discharge Summary  Patient ID: Alyssa Pearson MRN: [id number] DOB/AGE: August 29, 1986 35 y.o.  Admit Date: 04/16/2021 Discharge Date: 04/18/2021  Preoperative Diagnoses: Right Ectopic Pregnancy  Procedures: Procedure(s) (LRB): DIAGNOSTIC LAPAROSCOPY WITH REMOVAL OF RUPTURED ECTOPIC PREGNANCY (Right) LAPAROSCOPIC UNILATERAL SALPINGECTOMY (Right)  Significant Labs: CBC Latest Ref Rng & Units 04/18/2021 04/17/2021 04/17/2021  WBC 4.0 - 10.5 K/uL 10.4 14.1(H) 12.6(H)  Hemoglobin 12.0 - 15.0 g/dL 6.9(L) 7.7(L) 6.6(L)  Hematocrit 36.0 - 46.0 % 20.9(L) 22.6(L) 19.3(L)  Platelets 150 - 400 K/uL 203 230 213    Hospital Course:  Alyssa Pearson is a 35 y.o. [id number]  admitted for scheduled surgery.  She underwent the procedures as mentioned above, her operation was uncomplicated. For further details about surgery, please refer to the operative report. Patient had an uncomplicated postoperative course. By time of discharge on POD#2, her pain was controlled on oral pain medications; she was ambulating, voiding without difficulty, tolerating regular diet and passing flatus. She was deemed stable for discharge to home. She has received 2 U pRBCs in post op.  Discharge Exam: Blood pressure 115/65, pulse 79, temperature 98.4 F (36.9 C), temperature source Oral, resp. rate 18, last menstrual period 03/03/2021, SpO2 99 %. General appearance: alert and no distress  Resp: clear to auscultation bilaterally  Cardio: regular rate and rhythm  GI: soft, non-tender; bowel sounds normal; no masses, no organomegaly.  Incision: C/D/I, no erythema, no drainage noted Pelvic: scant blood on pad  Extremities: extremities normal, atraumatic, no cyanosis or edema and Homans sign is negative, no sign of DVT  Discharged Condition: Stable  Disposition: Discharge disposition: 01-Home or Self Care       Discharge Instructions    Call MD for:  persistant nausea and vomiting    Complete by: As directed    Call MD for:  redness, tenderness, or signs of infection (pain, swelling, redness, odor or green/yellow discharge around incision site)   Complete by: As directed    Call MD for:  severe uncontrolled pain   Complete by: As directed    Call MD for:  temperature >100.4   Complete by: As directed    Diet - low sodium heart healthy   Complete by: As directed    Discharge instructions   Complete by: As directed    Resume activities according to discharge instruction sheets   If the dressing is still on your incision site when you go home, remove it on the third day after your surgery date. Remove dressing if it begins to fall off, or if it is dirty or damaged before the third day.   Complete by: As directed    Increase activity slowly   Complete by: As directed      Allergies as of 04/18/2021   No Known Allergies     Medication List  Rx for PAIN MEDS (PERCOCET) and ANTIBIOTICS (KEFLEX) sent to pharmacy ALSO, take IRON daily for anemia    TAKE these medications   amLODipine 5 MG tablet Commonly known as: NORVASC TAKE 1 TABLET BY MOUTH EVERY DAY   ascorbic acid 500 MG tablet Commonly known as: VITAMIN C Take 500 mg by mouth daily.   cephALEXin 500 MG capsule Commonly known as: KEFLEX Take 1 capsule (500 mg total) by mouth 4 (four) times daily for 8 days.   metFORMIN 500 MG 24 hr tablet Commonly known as: Glucophage XR Take 1 tablet (500 mg total) by mouth daily with breakfast.   multivitamin-prenatal 27-0.8  MG Tabs tablet Take 1 tablet by mouth daily.   oxyCODONE-acetaminophen 5-325 MG tablet Commonly known as: PERCOCET/ROXICET Take 1 tablet by mouth every 4 (four) hours as needed for moderate pain.   predniSONE 10 MG tablet Commonly known as: DELTASONE Take 2 tablets (20 mg total) by mouth 2 (two) times daily.   traMADol 50 MG tablet Commonly known as: ULTRAM Take 1 tablet (50 mg total) by mouth every 6 (six) hours as needed.             Discharge Care Instructions  (From admission, onward)         Start     Ordered   04/18/21 0000  If the dressing is still on your incision site when you go home, remove it on the third day after your surgery date. Remove dressing if it begins to fall off, or if it is dirty or damaged before the third day.        04/18/21 1009          Follow-up Information    Nadara Mustard, MD. Go in 1 week(s).   Specialty: Obstetrics and Gynecology Contact information: [address] [place] [address] [phone]               Annamarie Major, MD

## 2021-04-18 NOTE — Progress Notes (Signed)
Pt improving, still has some weakness and fatigue and mild inc soreness Hgb 6.9 this am 'Pt desires another unit pRBS transfused Plan transfusion and then if stable d/c home See d/c summary, plan iron and continued Keflex for 8 more days  Annamarie Major, MD, [name], [facility] 04/18/2021  10:14 AM

## 2021-04-18 NOTE — Progress Notes (Signed)
Pt discharged home. Discharge instructions, prescriptions, and follow up appointments given to and reviewed with pt. Pt verbalized understanding. To be escorted by axillary.  

## 2021-04-18 NOTE — Discharge Instructions (Signed)
Diagnostic Laparoscopy, Care After The following information offers guidance on how to care for yourself after your procedure. Your health care provider may also give you more specific instructions. If you have problems or questions, contact your health care provider. What can I expect after the procedure? After the procedure, it is common to have:  Mild discomfort in the abdomen.  Sore throat. Women who have laparoscopy with a pelvic examination may have mild cramping and fluid coming from the vagina for a few days after the procedure. Follow these instructions at home: Medicines  Take over-the-counter and prescription medicines only as told by your health care provider.  If you were prescribed an antibiotic medicine, take it as told by your health care provider. Do not stop taking the antibiotic even if you start to feel better.  Ask your health care provider if the medicine prescribed to you: ? Requires you to avoid driving or using machinery. ? Can cause constipation. You may need to take these actions to prevent or treat constipation:  Drink enough fluid to keep your urine pale yellow.  Take over-the-counter or prescription medicines.  Eat foods that are high in fiber, such as beans, whole grains, and fresh fruits and vegetables.  Limit foods that are high in fat and processed sugars, such as fried or sweet foods. Incision care  Follow instructions from your health care provider about how to take care of your incisions. Make sure you: ? Wash your hands with soap and water for at least 20 seconds before and after you change your bandage (dressing). If soap and water are not available, use hand sanitizer. ? Change your dressing as told by your health care provider. ? Leave stitches (sutures), skin glue, or surgical tape in place. These skin closures may need to stay in place for 2 weeks or longer. If surgical tape edges start to loosen and curl up, you may trim the loose edges. Do  not remove the surgical tape completely unless your health care provider tells you to do that.  Check your incision areas every day for signs of infection. Check for: ? Redness, swelling, or pain. ? Fluid or blood. ? Warmth. ? Pus or a bad smell.   Activity  Return to your normal activities as told by your health care provider. Ask your health care provider what activities are safe for you.  Do not lift anything that is heavier than 10 lb (4.5 kg), or the limit that you are told, until your health care provider says that it is safe.  Avoid sitting for a long time without moving. Get up to take short walks every 1-2 hours. This is important to improve blood flow and breathing. Ask for help if you feel weak or unsteady. General instructions  Do not use any products that contain nicotine or tobacco. These products include cigarettes, chewing tobacco, and vaping devices, such as e-cigarettes. If you need help quitting, ask your health care provider.  If you were given a sedative during the procedure, it can affect you for several hours. Do not drive or operate machinery until your health care provider says that it is safe.  Do not take baths, swim, or use a hot tub until your health care provider approves. Ask your health care provider if you may take showers. You may only be allowed to take sponge baths.  Keep all follow-up visits. This is important. Contact a health care provider if:  You develop shoulder pain.  You feel light-headed or   faint.  You are unable to pass gas or have a bowel movement.  You feel nauseous or you vomit.  You develop a rash.  You have any of these signs of infection: ? Redness, swelling, or pain around an incision. ? Fluid or blood coming from an incision. ? Warmth coming from an incision. ? Pus or a bad smell coming from an incision. ? A fever or chills. Get help right away if:  You have severe pain.  You have vomiting that does not go away.  You  have heavy bleeding from the vagina.  Any incision opens up.  You have trouble breathing.  You have chest pain. These symptoms may represent a serious problem that is an emergency. Do not wait to see if the symptoms will go away. Get medical help right away. Call your local emergency services (911 in the [place] Do not drive yourself to the hospital. Summary  After the procedure, it is common to have mild discomfort in the abdomen and a sore throat.  Check your incision areas every day for signs of infection.  Return to your normal activities as told by your health care provider. Ask your health care provider what activities are safe for you. This information is not intended to replace advice given to you by your health care provider. Make sure you discuss any questions you have with your health care provider. Document Revised: 07/19/2020 Document Reviewed: 07/19/2020 Elsevier Patient Education  2021 Elsevier Inc.  

## 2021-04-19 LAB — TYPE AND SCREEN
ABO/RH(D): A POS
Antibody Screen: NEGATIVE
Unit division: 0
Unit division: 0

## 2021-04-19 LAB — BPAM RBC
Blood Product Expiration Date: 202205302359
Blood Product Expiration Date: 202206052359
ISSUE DATE / TIME: 202205121137
ISSUE DATE / TIME: 202205131047
Unit Type and Rh: 6200
Unit Type and Rh: 6200

## 2021-04-21 ENCOUNTER — Telehealth: Payer: Self-pay

## 2021-04-21 LAB — CULTURE, BLOOD (SINGLE)
Culture: NO GROWTH
Special Requests: ADEQUATE

## 2021-04-21 NOTE — Anesthesia Postprocedure Evaluation (Signed)
Anesthesia Post Note  Patient: Alyssa Pearson  Procedure(s) Performed: DIAGNOSTIC LAPAROSCOPY WITH REMOVAL OF RUPTURED ECTOPIC PREGNANCY (Right ) LAPAROSCOPIC UNILATERAL SALPINGECTOMY (Right )  Patient location during evaluation: PACU Anesthesia Type: General Level of consciousness: awake and alert and oriented Pain management: pain level controlled Vital Signs Assessment: post-procedure vital signs reviewed and stable Respiratory status: spontaneous breathing Cardiovascular status: blood pressure returned to baseline Anesthetic complications: no   No complications documented.   Last Vitals:  Vitals:   04/18/21 1537 04/18/21 1553  BP: 111/79 121/72  Pulse: (!) 112 79  Resp: 18 18  Temp: 37.1 C 37.1 C  SpO2: 98% 99%    Last Pain:  Vitals:   04/18/21 1553  TempSrc: Oral  PainSc:                  Deah Ottaway

## 2021-04-21 NOTE — Telephone Encounter (Signed)
Transition Care Management Unsuccessful Follow-up Telephone Call  Date of discharge and from where:  04/18/2021 from [facility]  Attempts:  1st Attempt  Reason for unsuccessful TCM follow-up call:  Unable to leave message     

## 2021-04-22 NOTE — Telephone Encounter (Signed)
Transition Care Management Unsuccessful Follow-up Telephone Call  Date of discharge and from where:  04/18/2021 from [facility]  Attempts:  2nd Attempt  Reason for unsuccessful TCM follow-up call:  Unable to leave message

## 2021-04-23 NOTE — Telephone Encounter (Signed)
Transition Care Management Unsuccessful Follow-up Telephone Call  Date of discharge and from where:  04/18/2021 from [facility]  Attempts:  3rd Attempt  Reason for unsuccessful TCM follow-up call:  Unable to reach patient

## 2021-04-25 ENCOUNTER — Encounter: Payer: Self-pay | Admitting: Obstetrics & Gynecology

## 2021-04-25 ENCOUNTER — Ambulatory Visit (INDEPENDENT_AMBULATORY_CARE_PROVIDER_SITE_OTHER): Payer: Medicaid Other | Admitting: Obstetrics & Gynecology

## 2021-04-25 ENCOUNTER — Other Ambulatory Visit: Payer: Self-pay

## 2021-04-25 VITALS — BP 130/80 | Ht 64.0 in | Wt 221.0 lb

## 2021-04-25 DIAGNOSIS — D508 Other iron deficiency anemias: Secondary | ICD-10-CM

## 2021-04-25 DIAGNOSIS — Z9889 Other specified postprocedural states: Secondary | ICD-10-CM

## 2021-04-25 NOTE — Progress Notes (Signed)
  Postoperative Follow-up Patient presents post op from laparoscopy and rigth salpingectomy for right sided ruptured ectopic pregnancy, 1 week ago.  Subjective: Patient reports marked improvement in her preop symptoms. Eating a regular diet without difficulty. Pain is controlled without any medications.  Activity: normal activities of daily living. Patient reports additional symptom's since surgery of None.  Objective: BP 130/80   Ht 5\' 4"  (1.626 m)   Wt 221 lb (100.2 kg)   BMI 37.93 kg/m  Physical Exam Constitutional:      General: She is not in acute distress.    Appearance: She is well-developed.  Cardiovascular:     Rate and Rhythm: Normal rate.  Pulmonary:     Effort: Pulmonary effort is normal.  Abdominal:     General: There is no distension.     Palpations: Abdomen is soft.     Tenderness: There is no abdominal tenderness.     Comments: Incision Healing Well   Musculoskeletal:        General: Normal range of motion.  Neurological:     Mental Status: She is alert and oriented to person, place, and time.     Cranial Nerves: No cranial nerve deficit.  Skin:    General: Skin is warm and dry.     Assessment: s/p :  laparoscopy and right salpingectomy progressing well  Plan: Patient has done well after surgery with no apparent complications.  I have discussed the post-operative course to date, and the expected progress moving forward.  The patient understands what complications to be concerned about.  I will see the patient in routine follow up, or sooner if needed.    Activity plan: No restriction. To see fertility specialist about future options  04/25/2021, 11:36 AM

## 2021-04-26 LAB — CBC
Hematocrit: 29.6 % — ABNORMAL LOW (ref 34.0–46.6)
Hemoglobin: 9.7 g/dL — ABNORMAL LOW (ref 11.1–15.9)
MCH: 28.9 pg (ref 26.6–33.0)
MCHC: 32.8 g/dL (ref 31.5–35.7)
MCV: 88 fL (ref 79–97)
Platelets: 349 10*3/uL (ref 150–450)
RBC: 3.36 x10E6/uL — ABNORMAL LOW (ref 3.77–5.28)
RDW: 14.9 % (ref 11.7–15.4)
WBC: 8.1 10*3/uL (ref 3.4–10.8)

## 2021-04-29 DIAGNOSIS — F4323 Adjustment disorder with mixed anxiety and depressed mood: Secondary | ICD-10-CM | POA: Diagnosis not present

## 2021-05-02 ENCOUNTER — Encounter: Payer: Medicaid Other | Admitting: Obstetrics & Gynecology

## 2021-05-06 DIAGNOSIS — F4323 Adjustment disorder with mixed anxiety and depressed mood: Secondary | ICD-10-CM | POA: Diagnosis not present

## 2021-05-13 DIAGNOSIS — F4323 Adjustment disorder with mixed anxiety and depressed mood: Secondary | ICD-10-CM | POA: Diagnosis not present

## 2021-05-16 ENCOUNTER — Encounter: Payer: Medicaid Other | Admitting: Obstetrics and Gynecology

## 2021-05-27 DIAGNOSIS — F4323 Adjustment disorder with mixed anxiety and depressed mood: Secondary | ICD-10-CM | POA: Diagnosis not present

## 2021-06-10 ENCOUNTER — Encounter: Payer: Self-pay | Admitting: *Deleted

## 2021-06-15 ENCOUNTER — Other Ambulatory Visit: Payer: Self-pay | Admitting: Internal Medicine

## 2021-06-15 DIAGNOSIS — R7303 Prediabetes: Secondary | ICD-10-CM

## 2021-06-16 ENCOUNTER — Emergency Department (HOSPITAL_COMMUNITY): Payer: Medicaid Other

## 2021-06-16 ENCOUNTER — Other Ambulatory Visit: Payer: Self-pay

## 2021-06-16 ENCOUNTER — Emergency Department (HOSPITAL_COMMUNITY)
Admission: EM | Admit: 2021-06-16 | Discharge: 2021-06-16 | Disposition: A | Discharge status: Home / Self Care | Payer: Medicaid Other | Attending: Emergency Medicine | Admitting: Emergency Medicine

## 2021-06-16 DIAGNOSIS — Z7984 Long term (current) use of oral hypoglycemic drugs: Secondary | ICD-10-CM | POA: Insufficient documentation

## 2021-06-16 DIAGNOSIS — Z87891 Personal history of nicotine dependence: Secondary | ICD-10-CM | POA: Insufficient documentation

## 2021-06-16 DIAGNOSIS — R7303 Prediabetes: Secondary | ICD-10-CM | POA: Diagnosis not present

## 2021-06-16 DIAGNOSIS — I10 Essential (primary) hypertension: Secondary | ICD-10-CM | POA: Insufficient documentation

## 2021-06-16 DIAGNOSIS — M25561 Pain in right knee: Secondary | ICD-10-CM | POA: Insufficient documentation

## 2021-06-16 DIAGNOSIS — M25562 Pain in left knee: Secondary | ICD-10-CM | POA: Insufficient documentation

## 2021-06-16 DIAGNOSIS — M25511 Pain in right shoulder: Secondary | ICD-10-CM | POA: Diagnosis not present

## 2021-06-16 DIAGNOSIS — Z79899 Other long term (current) drug therapy: Secondary | ICD-10-CM | POA: Insufficient documentation

## 2021-06-16 DIAGNOSIS — Y92410 Unspecified street and highway as the place of occurrence of the external cause: Secondary | ICD-10-CM | POA: Diagnosis not present

## 2021-06-16 MED ORDER — METFORMIN HCL ER 500 MG PO TB24: 500.0000 mg | ORAL_TABLET | Freq: Every day | ORAL | 11 refills | Status: DC

## 2021-06-16 NOTE — Discharge Instructions (Addendum)
There are no signs of serious injury on clinical exam or x-rays.  You will likely be sore for a few days.  Use Tylenol Motrin as needed for pain.  Gradually increase your activity.  Return here or see your doctor for problems.
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
## 2021-06-17 ENCOUNTER — Telehealth: Payer: Self-pay

## 2021-06-17 DIAGNOSIS — F4323 Adjustment disorder with mixed anxiety and depressed mood: Secondary | ICD-10-CM | POA: Diagnosis not present

## 2021-06-17 NOTE — Telephone Encounter (Signed)
Transition Care Management Follow-up Telephone Call Date of discharge and from where: 06/16/20[facility] How have you been since you were released from the hospital? Still has pain. Will take Motrin and Tylenol Any questions or concerns? No  Items Reviewed: Did the pt receive and understand the discharge instructions provided? Yes  Medications obtained and verified? Yes  Other? No  Any new allergies since your discharge? No  Dietary orders reviewed? N/A Do you have support at home? Yes   Home Care and Equipment/Supplies: Were home health services ordered? not applicable If so, what is the name of the agency? N/A  Has the agency set up a time to come to the patient's home? not applicable Were any new equipment or medical supplies ordered?  No What is the name of the medical supply agency? N/A Were you able to get the supplies/equipment? not applicable Do you have any questions related to the use of the equipment or supplies? No  Functional Questionnaire: (I = Independent and D = Dependent) ADLs: I  Bathing/Dressing- I  Meal Prep- I  Eating- I  Maintaining continence- I  Transferring/Ambulation- I  Managing Meds- I  Follow up appointments reviewed:  PCP Hospital f/u appt confirmed? Yes  Scheduled to see Dr. Huel Cote on 07/03/2021 @ 3:15 PM. Specialist Hospital f/u appt confirmed? No   Are transportation arrangements needed? No  If their condition worsens, is the pt aware to call PCP or go to the Emergency Dept.? Yes Was the patient provided with contact information for the PCP's office or ED? Yes Was to pt encouraged to call back with questions or concerns? Yes

## 2021-07-03 ENCOUNTER — Other Ambulatory Visit: Payer: Self-pay

## 2021-07-03 ENCOUNTER — Ambulatory Visit: Payer: Medicaid Other | Admitting: Internal Medicine

## 2021-07-03 ENCOUNTER — Encounter: Payer: Self-pay | Admitting: Internal Medicine

## 2021-07-03 VITALS — BP 112/70 | HR 84 | Temp 98.6°F | Ht 64.0 in | Wt 213.4 lb

## 2021-07-03 DIAGNOSIS — E611 Iron deficiency: Secondary | ICD-10-CM | POA: Diagnosis not present

## 2021-07-03 DIAGNOSIS — F32A Depression, unspecified: Secondary | ICD-10-CM | POA: Diagnosis not present

## 2021-07-03 DIAGNOSIS — R059 Cough, unspecified: Secondary | ICD-10-CM | POA: Diagnosis not present

## 2021-07-03 DIAGNOSIS — D649 Anemia, unspecified: Secondary | ICD-10-CM | POA: Diagnosis not present

## 2021-07-03 DIAGNOSIS — E669 Obesity, unspecified: Secondary | ICD-10-CM

## 2021-07-03 DIAGNOSIS — R7303 Prediabetes: Secondary | ICD-10-CM

## 2021-07-03 DIAGNOSIS — I10 Essential (primary) hypertension: Secondary | ICD-10-CM

## 2021-07-03 LAB — POCT GLYCOSYLATED HEMOGLOBIN (HGB A1C): Hemoglobin A1C: 5.6 % (ref 4.0–5.6)

## 2021-07-03 LAB — GLUCOSE, CAPILLARY: Glucose-Capillary: 87 mg/dL (ref 70–99)

## 2021-07-03 NOTE — Patient Instructions (Signed)
It was nice seeing you today! Thank you for choosing [facility] for your Primary Care.    Today we talked about:   HTN: Your blood pressure is great today. Continue the Amlodipine for now.   Pre-diabetes: We will recheck your A1c today. If it is improved, we can stop the Metformin.   Anemia: We will recheck your blood counts and iron levels today.   Cough: I recommend starting a daily allergy medication like Zyrtec and Flonase. If this does not get better in the next 2-3 weeks, please let me know.

## 2021-07-04 ENCOUNTER — Encounter: Payer: Self-pay | Admitting: Internal Medicine

## 2021-07-04 DIAGNOSIS — E611 Iron deficiency: Secondary | ICD-10-CM | POA: Insufficient documentation

## 2021-07-04 DIAGNOSIS — E669 Obesity, unspecified: Secondary | ICD-10-CM | POA: Insufficient documentation

## 2021-07-04 DIAGNOSIS — R059 Cough, unspecified: Secondary | ICD-10-CM | POA: Insufficient documentation

## 2021-07-04 LAB — CBC
Hematocrit: 32.9 % — ABNORMAL LOW (ref 34.0–46.6)
Hemoglobin: 11.1 g/dL (ref 11.1–15.9)
MCH: 28.3 pg (ref 26.6–33.0)
MCHC: 33.7 g/dL (ref 31.5–35.7)
MCV: 84 fL (ref 79–97)
Platelets: 336 10*3/uL (ref 150–450)
RBC: 3.92 x10E6/uL (ref 3.77–5.28)
RDW: 14.5 % (ref 11.7–15.4)
WBC: 5.2 10*3/uL (ref 3.4–10.8)

## 2021-07-04 LAB — IRON AND TIBC
Iron Saturation: 6 % — CL (ref 15–55)
Iron: 22 ug/dL — ABNORMAL LOW (ref 27–159)
Total Iron Binding Capacity: 393 ug/dL (ref 250–450)
UIBC: 371 ug/dL (ref 131–425)

## 2021-07-04 NOTE — Assessment & Plan Note (Signed)
BP: 112/70   [facility] states that she continues to take amlodipine daily without any difficulty.  She denies any chest pain, lower extremity edema.  Assessment/plan: Blood pressure well controlled at this time.  I suspect as patient continues to lose weight, she may not require amlodipine in the future.  I discussed that if she develops any dizziness with continued weight loss to please have her blood pressure rechecked.  -Continue amlodipine 5 mg daily for now

## 2021-07-04 NOTE — Progress Notes (Signed)
Internal Medicine Clinic Attending  Case discussed with Dr. Basaraba  At the time of the visit.  We reviewed the resident's history and exam and pertinent patient test results.  I agree with the assessment, diagnosis, and plan of care documented in the resident's note.  

## 2021-07-04 NOTE — Progress Notes (Signed)
   CC: HTN  HPI:  Ms.Ivoree S Raval is a 35 y.o. with a PMHx as listed below who presents to the clinic for HTN.   Please see the Encounters tab for problem-based Assessment & Plan regarding status of patient's acute and chronic conditions.  Past Medical History:  Diagnosis Date   Anemia    Anxiety    h/o    Arthritis    bil feet   Depression    h/o   GERD (gastroesophageal reflux disease)    h/o   Headache    migraines   Hypertension    Vertigo    Review of Systems: Review of Systems  Constitutional:  Positive for weight loss (intentional). Negative for chills and fever.  Respiratory:  Positive for cough (at night). Negative for shortness of breath and wheezing.   Cardiovascular:  Negative for chest pain, palpitations and leg swelling.  Gastrointestinal:  Negative for abdominal pain, diarrhea, nausea and vomiting.  Musculoskeletal:  Positive for joint pain (bilateral knees) and myalgias (right trapezius). Negative for falls.  Neurological:  Positive for weakness (one time, last week). Negative for dizziness, focal weakness and headaches.   Physical Exam:  Vitals:   07/03/21 1515  BP: 112/70  Pulse: 84  Temp: 98.6 F (37 C)  TempSrc: Oral  SpO2: 100%  Weight: 213 lb 6.4 oz (96.8 kg)  Height: 5\' 4"  (1.626 m)   Physical Exam Vitals and nursing note reviewed.  Constitutional:      Appearance: She is obese.  HENT:     Head: Normocephalic and atraumatic.     Mouth/Throat:     Mouth: Mucous membranes are moist.     Dentition: Normal dentition.     Pharynx: Oropharynx is clear. No pharyngeal swelling or posterior oropharyngeal erythema.     Tonsils: No tonsillar exudate or tonsillar abscesses.     Comments: Posterior pharynx with cobblestoning.  Eyes:     Extraocular Movements: Extraocular movements intact.     Conjunctiva/sclera: Conjunctivae normal.     Pupils: Pupils are equal, round, and reactive to light.  Cardiovascular:     Rate and Rhythm: Normal  rate and regular rhythm.     Heart sounds: No murmur heard. Pulmonary:     Effort: Pulmonary effort is normal. No respiratory distress.     Breath sounds: Normal breath sounds. No wheezing, rhonchi or rales.  Abdominal:     General: Bowel sounds are normal.     Palpations: Abdomen is soft.  Musculoskeletal:     Right lower leg: No edema.     Left lower leg: No edema.  Skin:    General: Skin is warm and dry.  Neurological:     General: No focal deficit present.     Mental Status: She is alert and oriented to person, place, and time. Mental status is at baseline.  Psychiatric:        Mood and Affect: Mood normal.        Behavior: Behavior normal.        Thought Content: Thought content normal.        Judgment: Judgment normal.    Assessment & Plan:   See Encounters Tab for problem based charting.  Patient discussed with Dr. 

## 2021-07-04 NOTE — Assessment & Plan Note (Signed)
Ms. Mullinix experienced a ruptured ectopic pregnancy in April 2022 that required emergent surgery.  Her hemoglobin decreased to the mid 6 and she required multiple transfusions.  She noted that initially she was quite weak afterwards but this improved.  Last week, she did have an episode of feeling generalized weakness and fatigue and is concerned that this is a sign that her anemia is not improving.  She is not currently taking any iron supplementation.  She denies any recent blood loss.  Assessment/plan: - We will repeat CBC and iron panel today  Addendum: Hemoglobin shows improvement back to normal levels, however iron panel demonstrates persistent iron deficiency.  Will recommend patient start a daily iron supplement and recheck in a few weeks.

## 2021-07-04 NOTE — Assessment & Plan Note (Signed)
[name] states that she has been experiencing a cough primarily at night for the last 3 to 4 days.  She notes that this has occurred in the past before she denies any shortness of breath, sore throat, nasal congestion but does feel like she is having some congested sinuses.  She denies any acid reflux.  She notes that she does snore at night but does not ever wake up short of breath.  Assessment/plan: Given that patient has cobblestoning on examination, I suspect that this may be secondary to postnasal drip.  Differential also includes cough variant asthma versus GERD.  We will start treatment for postnasal drip with allergy medication and if no resolution will pursue further studies.  -Recommended daily Zyrtec and Flonase

## 2021-07-04 NOTE — Assessment & Plan Note (Signed)
[facility] states that she had some difficulty with depression over the last couple months especially after her ectopic pregnancy that resulted in emergent surgery and multiple blood transfusions.  She states that not only was a traumatic medically, but it was a pregnancy that was very wanted.  She is currently seeing a counselor and finds this very helpful.  Assessment/plan: - Continue counseling

## 2021-07-04 NOTE — Assessment & Plan Note (Signed)
Lab Results  Component Value Date   HGBA1C 5.6 07/03/2021   A1c evaluated today and improved to 5.6%.   -Discontinue metformin

## 2021-07-04 NOTE — Assessment & Plan Note (Signed)
[facility] has been actively working on weight loss by dieting and working out at [employer]  Prior to her medical complications in April 2022, she had successfully lost 30 pounds on her own.  She has gained some weight back but she is still 20 pounds less than our last visit.  I congratulated [facility] on her great work and encouraged that she continue.

## 2021-07-10 ENCOUNTER — Other Ambulatory Visit: Payer: Self-pay | Admitting: Obstetrics and Gynecology

## 2021-07-10 DIAGNOSIS — I10 Essential (primary) hypertension: Secondary | ICD-10-CM

## 2021-09-15 ENCOUNTER — Encounter: Payer: Medicaid Other | Admitting: Internal Medicine

## 2021-11-07 ENCOUNTER — Encounter: Payer: Self-pay | Admitting: Obstetrics and Gynecology

## 2021-11-07 ENCOUNTER — Ambulatory Visit (INDEPENDENT_AMBULATORY_CARE_PROVIDER_SITE_OTHER): Payer: 59 | Admitting: Obstetrics and Gynecology

## 2021-11-07 ENCOUNTER — Other Ambulatory Visit: Payer: Self-pay

## 2021-11-07 VITALS — BP 142/86 | Ht 64.0 in | Wt 215.0 lb

## 2021-11-07 DIAGNOSIS — R635 Abnormal weight gain: Secondary | ICD-10-CM | POA: Diagnosis not present

## 2021-11-07 DIAGNOSIS — E8881 Metabolic syndrome: Secondary | ICD-10-CM

## 2021-11-07 DIAGNOSIS — R5383 Other fatigue: Secondary | ICD-10-CM | POA: Diagnosis not present

## 2021-11-07 DIAGNOSIS — N939 Abnormal uterine and vaginal bleeding, unspecified: Secondary | ICD-10-CM | POA: Diagnosis not present

## 2021-11-07 DIAGNOSIS — L709 Acne, unspecified: Secondary | ICD-10-CM

## 2021-11-07 DIAGNOSIS — Z8759 Personal history of other complications of pregnancy, childbirth and the puerperium: Secondary | ICD-10-CM

## 2021-11-07 NOTE — Progress Notes (Signed)
Obstetrics & Gynecology Office Visit   Chief Complaint:  Chief Complaint  Patient presents with   Vaginal Bleeding    Had a normal cycle 10/20/21, started bleeding again 11/04/21. RM 4    History of Present Illness: 35 y.o. [id number] Has menstrual cycle 2 weeks early, heavy.  History of one prior salpingectomy 04/16/2021 in setting of tubal reversal.  Cycles have intervals have been irreglar since ectopic.  She had what she felts was a normal menstrual cycle on 10/20/21, then started bleeding again about two week later and continues to bleed currently.  Bleeding described as heavy and has contained clots.  Mild dysmenorrhea.  Thyroid: no weight fluctuations, temperature intolerance, constipation or diarrhea Hyperprolactinemia: no headaches, vision changes, RUQ or epigastric pain PCOS: acne, no hirsutism No family history Fragile-X or early menopause  Laparoscopy pictures and prior ultrasound did not show any evidence of uterine fibroids.  Review of Systems: Review of Systems  Constitutional: Negative.   Gastrointestinal: Negative.   Genitourinary: Negative.   Neurological:  Negative for headaches.  Endo/Heme/Allergies:  Does not bruise/bleed easily.    Past Medical History:  Past Medical History:  Diagnosis Date   Anemia    Anxiety    h/o    Arthritis    bil feet   Depression    h/o   Ectopic pregnancy 04/16/2021   GERD (gastroesophageal reflux disease)    h/o   Headache    migraines   Hypertension    Vertigo     Past Surgical History:  Past Surgical History:  Procedure Laterality Date   CHROMOPERTUBATION N/A 09/14/2019   Procedure: CHROMOPERTUBATION;  Surgeon: Natale Milch, MD;  Location: [facility];  Service: Gynecology;  Laterality: N/A;   DIAGNOSTIC LAPAROSCOPY WITH REMOVAL OF ECTOPIC PREGNANCY Right 04/16/2021   Procedure: DIAGNOSTIC LAPAROSCOPY WITH REMOVAL OF RUPTURED ECTOPIC PREGNANCY;  Surgeon: Nadara Mustard, MD;  Location: [facility];  Service:  Gynecology;  Laterality: Right;   DILATION AND EVACUATION N/A 12/19/2019   Procedure: DILATATION AND EVACUATION;  Surgeon: Natale Milch, MD;  Location: [facility];  Service: Gynecology;  Laterality: N/A;   HYSTEROSCOPY WITH D & C N/A 09/14/2019   Procedure: DILATATION AND CURETTAGE /HYSTEROSCOPY, POLYPECTOMY;  Surgeon: Natale Milch, MD;  Location: [facility];  Service: Gynecology;  Laterality: N/A;   LAPAROSCOPIC UNILATERAL SALPINGECTOMY Right 04/16/2021   Procedure: LAPAROSCOPIC UNILATERAL SALPINGECTOMY;  Surgeon: Nadara Mustard, MD;  Location: [facility];  Service: Gynecology;  Laterality: Right;   LAPAROSCOPY N/A 09/14/2019   Procedure: LAPAROSCOPY DIAGNOSTIC;  Surgeon: Natale Milch, MD;  Location: [facility];  Service: Gynecology;  Laterality: N/A;   TUBAL LIGATION     Tubal reversal  08/31/2017   WISDOM TOOTH EXTRACTION      Gynecologic History: Patient's last menstrual period was 10/20/2021.  Obstetric History: [id number]  Family History:  Family History  Problem Relation Age of Onset   Hypertension Mother    Diabetes Father    Hypertension Father     Social History:  Social History   Socioeconomic History   Marital status: Significant Other    Spouse name: Not on file   Number of children: Not on file   Years of education: Not on file   Highest education level: Not on file  Occupational History   Not on file  Tobacco Use   Smoking status: Former    Packs/day: 0.50    Years: 7.00    Pack years: 3.50  Types: Cigarettes    Quit date: 09/10/2009    Years since quitting: 12.1   Smokeless tobacco: Never  Vaping Use   Vaping Use: Never used  Substance and Sexual Activity   Alcohol use: No   Drug use: No   Sexual activity: Yes    Birth control/protection: None  Other Topics Concern   Not on file  Social History Narrative   Not on file   Social Determinants of Health   Financial Resource Strain: Not on file  Food Insecurity: Not on file   Transportation Needs: Not on file  Physical Activity: Not on file  Stress: Not on file  Social Connections: Not on file  Intimate Partner Violence: Not on file    Allergies:  No Known Allergies  Medications: Prior to Admission medications   Medication Sig Start Date End Date Taking? Authorizing Provider  amLODipine (NORVASC) 5 MG tablet Take 1 tablet (5 mg total) by mouth daily. 07/11/21   Schuman, Jaquelyn Bitter, MD  ascorbic acid (VITAMIN C) 500 MG tablet Take 500 mg by mouth daily.    [provider]  Prenatal Vit-Fe Fumarate-FA (MULTIVITAMIN-PRENATAL) 27-0.8 MG TABS tablet Take 1 tablet by mouth daily.     [provider]    Physical Exam Vitals:  Vitals:   11/07/21 0835  BP: (!) 142/86   Patient's last menstrual period was 10/20/2021.  General: NAD HEENT: normocephalic, anicteric Thyroid: no enlargement, no palpable nodules Pulmonary: No increased work of breathing Cardiovascular: RRR, distal pulses 2+ Abdomen: soft, non-tender, non-distended.  Umbilicus without lesions.  No hepatomegaly, splenomegaly or masses palpable. No evidence of hernia  Genitourinary:  External: Normal external female genitalia.  Normal urethral meatus, normal  Bartholin's and Skene's glands.    Vagina: Normal vaginal mucosa, no evidence of prolapse.    Cervix: Grossly normal in appearance, moderate bleeding  Uterus: Non-enlarged, mobile, normal contour.  No CMT  Adnexa: ovaries non-enlarged, no adnexal masses  Rectal: deferred  Lymphatic: no evidence of inguinal lymphadenopathy Extremities: no edema, erythema, or tenderness Neurologic: Grossly intact Psychiatric: mood appropriate, affect full  Female chaperone present for pelvic  portions of the physical exam  Assessment: 35 y.o. [id number]. Initial evalution AUB  Plan: Problem List Items Addressed This Visit   None Visit Diagnoses     Abnormal uterine bleeding    -  Primary   Relevant Orders   Anti mullerian hormone    17-Hydroxyprogesterone   Androstenedione   DHEA-sulfate   Hemoglobin A1c   FSH/LH   Testosterone,Free and Total   Prolactin   TSH   Beta hCG quant (ref lab)   CBC   US PELVIC COMPLETE WITH TRANSVAGINAL   Acne, unspecified acne type       Weight gain       Relevant Orders   TSH   Other fatigue       Relevant Orders   TSH   CBC   History of ectopic pregnancy       Relevant Orders   Beta hCG quant (ref lab)   Insulin resistance       Relevant Orders   Hemoglobin A1c      Discussed management options for abnormal uterine bleeding including expectant, NSAIDs, tranexamic acid (Lysteda), oral progesterone (Provera, norethindrone, megace), Depo Provera, Levonorgestrel containing IUD, endometrial ablation (Novasure) or hysterectomy as definitive surgical management.  Discussed risks and benefits of each method.   Final management decision will hinge on results of patient's work up and whether an underlying  etiology for the patients bleeding symptoms can be discerned.  We will conduct a basic work up examining using the PALM-COIEN classification system.  In the meantime the patient opts to trial Provara 10mg  while we await results of her ultrasound and labs.  Printed patient education handouts were given to the patient to review at home.  Bleeding precautions reviewed.   2.  Return for after ultrasound at [facility].    [facility], MD, [facility], [facility] 11/07/2021, 8:38 AM

## 2021-11-11 MED ORDER — MEDROXYPROGESTERONE ACETATE 10 MG PO TABS: 10.0000 mg | ORAL_TABLET | Freq: Every day | ORAL | 0 refills | Status: DC

## 2021-11-12 LAB — CBC
Hematocrit: 31.8 % — ABNORMAL LOW (ref 34.0–46.6)
Hemoglobin: 10.1 g/dL — ABNORMAL LOW (ref 11.1–15.9)
MCH: 27.2 pg (ref 26.6–33.0)
MCHC: 31.8 g/dL (ref 31.5–35.7)
MCV: 86 fL (ref 79–97)
Platelets: 313 10*3/uL (ref 150–450)
RBC: 3.71 x10E6/uL — ABNORMAL LOW (ref 3.77–5.28)
RDW: 15.6 % — ABNORMAL HIGH (ref 11.7–15.4)
WBC: 7.2 10*3/uL (ref 3.4–10.8)

## 2021-11-12 LAB — ANDROSTENEDIONE: Androstenedione: 61 ng/dL (ref 41–262)

## 2021-11-12 LAB — DHEA-SULFATE: DHEA-SO4: 278 ug/dL (ref 57.3–279.2)

## 2021-11-12 LAB — TSH: TSH: 1.4 u[IU]/mL (ref 0.450–4.500)

## 2021-11-12 LAB — PROLACTIN: Prolactin: 5.3 ng/mL (ref 4.8–23.3)

## 2021-11-12 LAB — 17-HYDROXYPROGESTERONE: 17-Hydroxyprogesterone: 13 ng/dL

## 2021-11-12 LAB — TESTOSTERONE,FREE AND TOTAL
Testosterone, Free: 2.6 pg/mL (ref 0.0–4.2)
Testosterone: 18 ng/dL (ref 8–60)

## 2021-11-12 LAB — HEMOGLOBIN A1C
Est. average glucose Bld gHb Est-mCnc: 123 mg/dL
Hgb A1c MFr Bld: 5.9 % — ABNORMAL HIGH (ref 4.8–5.6)

## 2021-11-12 LAB — BETA HCG QUANT (REF LAB): hCG Quant: <1 m[IU]/mL

## 2021-11-14 LAB — FSH/LH
FSH: 14.9 m[IU]/mL
LH: 6.9 m[IU]/mL

## 2021-11-14 LAB — ANTI MULLERIAN HORMONE: ANTI-MULLERIAN HORMONE (AMH): 0.203 ng/mL

## 2021-11-19 ENCOUNTER — Encounter: Payer: Self-pay | Admitting: Obstetrics and Gynecology

## 2021-11-21 ENCOUNTER — Ambulatory Visit: Payer: 59

## 2021-11-21 ENCOUNTER — Ambulatory Visit: Payer: 59 | Admitting: Obstetrics and Gynecology

## 2021-11-21 DIAGNOSIS — N939 Abnormal uterine and vaginal bleeding, unspecified: Secondary | ICD-10-CM

## 2021-11-24 ENCOUNTER — Ambulatory Visit (HOSPITAL_COMMUNITY)
Admission: RE | Admit: 2021-11-24 | Discharge: 2021-11-24 | Disposition: A | Discharge status: Home / Self Care | Payer: Medicaid Other | Source: Ambulatory Visit | Attending: Obstetrics and Gynecology | Admitting: Obstetrics and Gynecology

## 2021-11-24 ENCOUNTER — Other Ambulatory Visit: Payer: Self-pay

## 2021-11-24 DIAGNOSIS — N939 Abnormal uterine and vaginal bleeding, unspecified: Secondary | ICD-10-CM | POA: Insufficient documentation

## 2021-11-25 ENCOUNTER — Ambulatory Visit: Payer: 59 | Admitting: Obstetrics and Gynecology

## 2021-11-27 ENCOUNTER — Ambulatory Visit (HOSPITAL_COMMUNITY): Payer: Medicaid Other

## 2021-12-03 ENCOUNTER — Other Ambulatory Visit (INDEPENDENT_AMBULATORY_CARE_PROVIDER_SITE_OTHER): Payer: 59

## 2021-12-03 DIAGNOSIS — E611 Iron deficiency: Secondary | ICD-10-CM

## 2021-12-04 LAB — IRON AND TIBC
Iron Saturation: 7 % — CL (ref 15–55)
Iron: 29 ug/dL (ref 27–159)
Total Iron Binding Capacity: 393 ug/dL (ref 250–450)
UIBC: 364 ug/dL (ref 131–425)

## 2021-12-09 ENCOUNTER — Encounter: Payer: Medicaid Other | Admitting: Adult Health

## 2021-12-10 ENCOUNTER — Ambulatory Visit (INDEPENDENT_AMBULATORY_CARE_PROVIDER_SITE_OTHER): Payer: 59 | Admitting: Obstetrics and Gynecology

## 2021-12-10 ENCOUNTER — Other Ambulatory Visit: Payer: Self-pay

## 2021-12-10 DIAGNOSIS — Z3169 Encounter for other general counseling and advice on procreation: Secondary | ICD-10-CM

## 2021-12-10 DIAGNOSIS — N838 Other noninflammatory disorders of ovary, fallopian tube and broad ligament: Secondary | ICD-10-CM

## 2021-12-10 DIAGNOSIS — N939 Abnormal uterine and vaginal bleeding, unspecified: Secondary | ICD-10-CM | POA: Diagnosis not present

## 2021-12-10 NOTE — Progress Notes (Signed)
I connected with Alyssa Pearson on 12/11/21 at  2:55 PM EST by telephone and verified that I am speaking with the correct person using two identifiers.   I discussed the limitations, risks, security and privacy concerns of performing an evaluation and management service by telephone and the availability of in person appointments. I also discussed with the patient that there may be a patient responsible charge related to this service. The patient expressed understanding and agreed to proceed.  The patient was at home I spoke with the patient from my workstation phone The names of people involved in this encounter were: Alyssa Pearson , and Malachy Mood   Gynecology Ultrasound Follow Up  Chief Complaint:  Chief Complaint  Patient presents with   Follow-up    Phone visit - TVUS, no concerns.     History of Present Illness: Patient is a 36 y.o. female who presents today for ultrasound evaluation of abnormal uterine bleeding, desire to conceive .  Ultrasound demonstrates the following findgins Adnexa: no masses seen  Uterus: Non-enlarged, no evidence of uterine fibroids with endometrial stripe  12.24mm no focal lesions Additional: trace free fluid  Review of Systems: Review of Systems  Constitutional: Negative.   Gastrointestinal: Negative.   Genitourinary: Negative.     Past Medical History:  Past Medical History:  Diagnosis Date   Anemia    Anxiety    h/o    Arthritis    bil feet   Depression    h/o   Ectopic pregnancy 04/16/2021   GERD (gastroesophageal reflux disease)    h/o   Headache    migraines   Hypertension    Vertigo     Past Surgical History:  Past Surgical History:  Procedure Laterality Date   CHROMOPERTUBATION N/A 09/14/2019   Procedure: CHROMOPERTUBATION;  Surgeon: Homero Fellers, MD;  Location: [facility];  Service: Gynecology;  Laterality: N/A;   DIAGNOSTIC LAPAROSCOPY WITH REMOVAL OF ECTOPIC PREGNANCY Right 04/16/2021   Procedure:  DIAGNOSTIC LAPAROSCOPY WITH REMOVAL OF RUPTURED ECTOPIC PREGNANCY;  Surgeon: Gae Dry, MD;  Location: [facility];  Service: Gynecology;  Laterality: Right;   DILATION AND EVACUATION N/A 12/19/2019   Procedure: DILATATION AND EVACUATION;  Surgeon: Homero Fellers, MD;  Location: [facility];  Service: Gynecology;  Laterality: N/A;   HYSTEROSCOPY WITH D & C N/A 09/14/2019   Procedure: DILATATION AND CURETTAGE /HYSTEROSCOPY, POLYPECTOMY;  Surgeon: Homero Fellers, MD;  Location: [facility];  Service: Gynecology;  Laterality: N/A;   LAPAROSCOPIC UNILATERAL SALPINGECTOMY Right 04/16/2021   Procedure: LAPAROSCOPIC UNILATERAL SALPINGECTOMY;  Surgeon: Gae Dry, MD;  Location: [facility];  Service: Gynecology;  Laterality: Right;   LAPAROSCOPY N/A 09/14/2019   Procedure: LAPAROSCOPY DIAGNOSTIC;  Surgeon: Homero Fellers, MD;  Location: [facility];  Service: Gynecology;  Laterality: N/A;   TUBAL LIGATION     Tubal reversal  08/31/2017   WISDOM TOOTH EXTRACTION      Gynecologic History:  No LMP recorded. Contraception: none Last Pap: 04/03/21 Results were: . NILM HPV negative  Family History:  Family History  Problem Relation Age of Onset   Hypertension Mother    Diabetes Father    Hypertension Father     Social History:  Social History   Socioeconomic History   Marital status: Significant Other    Spouse name: Not on file   Number of children: Not on file   Years of education: Not on file   Highest education level: Not on file  Occupational History   Not on file  Tobacco Use   Smoking status: Former    Packs/day: 0.50    Years: 7.00    Pack years: 3.50    Types: Cigarettes    Quit date: 09/10/2009    Years since quitting: 12.2   Smokeless tobacco: Never  Vaping Use   Vaping Use: Never used  Substance and Sexual Activity   Alcohol use: No   Drug use: No   Sexual activity: Yes    Birth control/protection: None  Other Topics Concern   Not on file  Social  History Narrative   Not on file   Social Determinants of Health   Financial Resource Strain: Not on file  Food Insecurity: Not on file  Transportation Needs: Not on file  Physical Activity: Not on file  Stress: Not on file  Social Connections: Not on file  Intimate Partner Violence: Not on file    Allergies:  No Known Allergies  Medications: Prior to Admission medications   Medication Sig Start Date End Date Taking? Authorizing Provider  ascorbic acid (VITAMIN C) 500 MG tablet Take 500 mg by mouth daily.   Yes [provider]  Prenatal Vit-Fe Fumarate-FA (MULTIVITAMIN-PRENATAL) 27-0.8 MG TABS tablet Take 1 tablet by mouth daily.    Yes [provider]  amLODipine (NORVASC) 5 MG tablet Take 1 tablet (5 mg total) by mouth daily. 07/11/21   Homero Fellers, MD  letrozole (FEMARA) 2.5 MG tablet Take by mouth. 06/15/21   [provider]  medroxyPROGESTERone (PROVERA) 10 MG tablet Take 1 tablet (10 mg total) by mouth daily. 11/11/21 12/11/21  Malachy Mood, MD    Physical Exam Vitals: There were no vitals taken for this visit.  No physical exam as this was a remote telephone visit to promote social distancing during the current COVID-19 Pandemic   Recent Results (from the past 2160 hour(s))  17-Hydroxyprogesterone     Status: None   Collection Time: 11/07/21  9:27 AM  Result Value Ref Range   17-Hydroxyprogesterone 13 ng/dL    Comment:                           Adult Female                            Follicular        15 -  70                            Luteal            35 - 290   Androstenedione     Status: None   Collection Time: 11/07/21  9:27 AM  Result Value Ref Range   Androstenedione 61 41 - 262 ng/dL    Comment: This test was developed and its performance characteristics determined by [facility]. It has not been cleared or approved by the Food and Drug Administration.   DHEA-sulfate     Status: None   Collection Time: 11/07/21  9:27 AM   Result Value Ref Range   DHEA-SO4 278.0 57.3 - 279.2 ug/dL  Hemoglobin A1c     Status: Abnormal   Collection Time: 11/07/21  9:27 AM  Result Value Ref Range   Hgb A1c MFr Bld 5.9 (H) 4.8 - 5.6 %    Comment:  Prediabetes: 5.7 - 6.4          Diabetes: >6.4          Glycemic control for adults with diabetes: <7.0    Est. average glucose Bld gHb Est-mCnc 123 mg/dL  Testosterone,Free and Total     Status: None   Collection Time: 11/07/21  9:27 AM  Result Value Ref Range   Testosterone 18 8 - 60 ng/dL   Testosterone, Free 2.6 0.0 - 4.2 pg/mL  Prolactin     Status: None   Collection Time: 11/07/21  9:27 AM  Result Value Ref Range   Prolactin 5.3 4.8 - 23.3 ng/mL  TSH     Status: None   Collection Time: 11/07/21  9:27 AM  Result Value Ref Range   TSH 1.400 0.450 - 4.500 uIU/mL  Beta hCG quant (ref lab)     Status: None   Collection Time: 11/07/21  9:27 AM  Result Value Ref Range   hCG Quant <1 mIU/mL    Comment:                      Female (Non-pregnant)    0 -     5                             (Postmenopausal)  0 -     8                      Female (Pregnant)                      Weeks of Gestation                              3                6 -    71                              4               10 -   750                              5              217 -  7138                              6              158 - [id number]                              7             [id number] -[id number]                              8            [address] [phone]  [place] [facility] E CLIA methodology   CBC     Status: Abnormal   Collection Time: 11/07/21  9:27 AM  Result Value Ref Range   WBC 7.2 3.4 - 10.8 x10E3/uL   RBC 3.71 (L) 3.77 - 5.28 x10E6/uL   Hemoglobin 10.1 (L) 11.1 - 15.9 g/dL   Hematocrit 31.8 (L) 34.0 - 46.6 %   MCV 86 79 - 97 fL   MCH 27.2 26.6 - 33.0 pg   MCHC 31.8 31.5 - 35.7 g/dL   RDW 15.6 (H) 11.7 - 15.4 %   Platelets 313 150 - 450 x10E3/uL  Anti mullerian hormone     Status: None   Collection Time: 11/07/21  9:38 AM  Result Value Ref Range   ANTI-MULLERIAN HORMONE (AMH) 0.203 ng/mL    Comment: For assays employing antibodies, the possibility exists for interference by heterophile antibodies in the samples.1  1.Kricka L.  Interferences in Immunoassays - still a threat.  Clin. Chem. 2000; [id number].  This test was developed and its performance characteristics determined by [facility]. It has not been cleared or approved by the Food and Drug Administration. Reference Range: Females 36 - 40y: 0.42 - 8.34 Median  1.69 AMH concentrations of >= 1.06 ng/mL is correlated with a better response to ovarian stimulation, produced more retrievable oocytes and higher odds of live birth according to [place].  Buelah Manis and Sterility. 2010CS:[id number].  The current AMH test method correlates with the study method with a slope of 0.94. Females at risk of ovarian hyperstimulation syndrome or polycystic ovarian syndrome (PCOS) may exhibit elevated serum AMH concentrations.   AMH levels from PCOS patients may  be 2 to 5 fold higher than age-appropriate reference in terval values. Granulosa cell tumors of the ovary may secrete AMH along with other tumor markers.  Elevated AMH is not specific for malignancy, and the assay should not be used exclusively to diagnose or exclude an AMH-secreting ovarian tumor.   FSH/LH     Status: None   Collection Time: 11/07/21  9:38 AM  Result Value Ref Range   LH 6.9 mIU/mL    Comment:                      Adult Female:                       Follicular phase      2.4 -  12.6                       Ovulation phase      14.0 -  95.6                       Luteal phase          1.0 -  11.4                       Postmenopausal        7.7 -  58.5    FSH 14.9 mIU/mL    Comment:                     Adult Female:                       Follicular phase      3.5 -  12.5                       Ovulation phase       4.7 -  21.5                       Luteal phase          1.7 -   7.7                       Postmenopausal       25.8 - 134.8   Iron and IBC [id number])     Status: Abnormal   Collection Time: 12/03/21  9:45 AM  Result Value Ref Range   Total Iron Binding Capacity 393 250 - 450 ug/dL   UIBC 364 131 - 425 ug/dL   Iron 29 27 - 159 ug/dL   Iron Saturation 7 (LL) 15 - 55 %   US PELVIC COMPLETE WITH TRANSVAGINAL  Result Date: 11/24/2021 CLINICAL DATA:  Initial evaluation for abnormal uterine bleeding. History of prior right salpingectomy for ectopic pregnancy. EXAM: TRANSABDOMINAL AND TRANSVAGINAL ULTRASOUND OF PELVIS TECHNIQUE: Both transabdominal and transvaginal ultrasound examinations of the pelvis were performed. Transabdominal technique was performed for global imaging of the pelvis including uterus, ovaries, adnexal regions, and pelvic cul-de-sac. It was necessary to proceed with endovaginal exam following the transabdominal exam to visualize the uterus, endometrium, and ovaries. COMPARISON:  None available. FINDINGS: Uterus Measurements: 11.7 x 5.4 x 6.1 cm = volume: 204.1 mL. Uterus is anteverted. No discrete fibroid or other mass. Endometrium Thickness: 12.4 mm.  No focal abnormality visualized. Right ovary Measurements: 2.5 x 1.9 x 2.5 cm =  volume: 5.9 mL. Normal appearance/no adnexal mass. Left ovary Measurements: 4.1 x 2.8 x 3.7 cm = volume: 22.2 mL. Normal appearance/no adnexal mass. Other findings Trace free physiologic fluid seen within the pelvis. IMPRESSION: 1.  Normal pelvic ultrasound. 2. Endometrial stripe measures 12.4 mm in thickness. If bleeding remains unresponsive to hormonal or medical therapy, sonohysterogram should be considered for focal lesion work-up. (Ref: Radiological Reasoning: Algorithmic Workup of Abnormal Vaginal Bleeding with Endovaginal Sonography and Sonohysterography. AJR [id number]). Electronically Signed   By: Jeannine Boga M.D.   On: 11/24/2021 19:23      Assessment: 36 y.o. [id number] AUB and desire to conceive   Plan: Problem List Items Addressed This Visit   None Visit Diagnoses     Infertility counseling    -  Primary   Relevant Orders   Ambulatory referral to Endocrinology   Abnormal uterine bleeding       Relevant Orders   Ambulatory referral to Endocrinology   Diminished ovarian reserve       Relevant Orders   Ambulatory referral to Endocrinology       1) AUB - Imaging without clear etiology, laboratory work up normal other than low AMH and mildly elevated HgbA1C.   management option include hormonal management including IUD, TXA, ablation, or hysterectomy.  However as the patient is trying to conceive, and given laboratory work up showing low AMH (non-cycle specific FSH/LH no estradiol) she is most interested in REI referral. - REI referral placed  2) Telephone time 12:13min  3) No follow-ups on file.    Malachy Mood, MD, [facility], [facility] 12/10/2021, 3:21 PM

## 2021-12-13 ENCOUNTER — Encounter: Payer: Self-pay | Admitting: Internal Medicine

## 2021-12-23 ENCOUNTER — Other Ambulatory Visit: Payer: Self-pay | Admitting: Internal Medicine

## 2021-12-23 DIAGNOSIS — E611 Iron deficiency: Secondary | ICD-10-CM

## 2021-12-23 NOTE — Assessment & Plan Note (Signed)
Persistently low iron saturation despite daily iron supplementation. Given patient is symptomatic with fatigue, will pursue IV iron supplementation.

## 2021-12-26 ENCOUNTER — Telehealth: Payer: Self-pay | Admitting: *Deleted

## 2021-12-26 NOTE — Telephone Encounter (Signed)
Name: Logen, Fowle MRN: [id number]  Date: 12/30/2021 Status: Sch  Time: 8:00 AM Length: 60  Visit Type: Duncan Dull [1897] Copay: $0.00  Provider: [id number] Department: [facility]  Referring Provider: Verdene Lennert CSN: [id number]  Notes: 1x Iv Feraheme    Pt aware\

## 2021-12-30 ENCOUNTER — Ambulatory Visit (HOSPITAL_COMMUNITY)
Admission: RE | Admit: 2021-12-30 | Discharge: 2021-12-30 | Disposition: A | Discharge status: Home / Self Care | Payer: 59 | Source: Ambulatory Visit | Attending: Internal Medicine | Admitting: Internal Medicine

## 2021-12-30 DIAGNOSIS — E611 Iron deficiency: Secondary | ICD-10-CM | POA: Insufficient documentation

## 2021-12-30 DIAGNOSIS — R5383 Other fatigue: Secondary | ICD-10-CM | POA: Diagnosis not present

## 2021-12-30 MED ORDER — SODIUM CHLORIDE 0.9 % IV SOLN
510.0000 mg | Freq: Once | INTRAVENOUS | Status: AC
  Administered 2021-12-30: 08:00:00 510 mg via INTRAVENOUS
  Filled 2021-12-30: qty 510

## 2022-02-05 ENCOUNTER — Encounter: Payer: 59 | Admitting: Adult Health

## 2022-08-04 ENCOUNTER — Telehealth: Payer: Self-pay

## 2022-08-04 NOTE — Telephone Encounter (Signed)
I called and contacted patient via phone. Patient is aware we only have one surgical MD at this time and there is no opening in our scheduled with him and the next month hasn't been opened. Patient expressed she really wants to get this done sooner then later but once to be able to call back for other gyn issues. Patient will be transferring to [facility] at [facility] for surgical consult.

## 2022-08-04 NOTE — Telephone Encounter (Signed)
Patient has had multiple surgeries including D&C for polypectomy in 09/2019. Last ultrasound 11/2021. She will need a surgery consult to determine if any additional work up is needed prior to scheduling surgery. If she can not wait until our first available with a surgeon, she will need to schedule with another office.

## 2022-08-04 NOTE — Telephone Encounter (Signed)
TRIAGE VOICEMAIL: Patient reports she was going thru fertility treatment. She decided not to go thru with it. In the process, she was told she has some polyps that she has had before that was removed by our office in the past. She would like to set up appointment to get those removed as soon as possible. She reports schedulers advised we are scheduling patients out. She was transferred to triage to figure out what she needs do or if she needs to contact another office to get those removed. Cb#[id number]

## 2022-08-18 ENCOUNTER — Other Ambulatory Visit: Payer: Self-pay

## 2022-08-18 ENCOUNTER — Ambulatory Visit (INDEPENDENT_AMBULATORY_CARE_PROVIDER_SITE_OTHER): Payer: 59

## 2022-08-18 VITALS — BP 112/94 | HR 75 | Temp 98.2°F | Ht 64.0 in | Wt 224.8 lb

## 2022-08-18 DIAGNOSIS — R7303 Prediabetes: Secondary | ICD-10-CM | POA: Diagnosis not present

## 2022-08-18 DIAGNOSIS — I10 Essential (primary) hypertension: Secondary | ICD-10-CM | POA: Diagnosis not present

## 2022-08-18 DIAGNOSIS — E611 Iron deficiency: Secondary | ICD-10-CM | POA: Diagnosis not present

## 2022-08-18 DIAGNOSIS — N390 Urinary tract infection, site not specified: Secondary | ICD-10-CM

## 2022-08-18 DIAGNOSIS — Z87891 Personal history of nicotine dependence: Secondary | ICD-10-CM

## 2022-08-18 LAB — POCT URINALYSIS DIPSTICK
Bilirubin, UA: NEGATIVE
Glucose, UA: NEGATIVE
Ketones, UA: NEGATIVE
Nitrite, UA: NEGATIVE
Protein, UA: POSITIVE — AB
Spec Grav, UA: 1.025 (ref 1.010–1.025)
Urobilinogen, UA: 0.2 E.U./dL
pH, UA: 6 (ref 5.0–8.0)

## 2022-08-18 MED ORDER — CEPHALEXIN 250 MG PO CAPS: 250.0000 mg | ORAL_CAPSULE | Freq: Four times a day (QID) | ORAL | 0 refills | Status: AC

## 2022-08-18 NOTE — Assessment & Plan Note (Signed)
Orthostatic vitals today were hypertensive.  She mentions she has not been taking her amlodipine because her blood pressure had previously improved.  Need to discuss this at next appointment after reassessing.

## 2022-08-18 NOTE — Patient Instructions (Signed)
Alyssa Pearson, it was a pleasure seeing you today! You endorsed feeling well today. Below are some of the things we talked about this visit. We look forward to seeing you in the follow up appointment!  Today we discussed: UTI: I'll give you a call if the urine results come back positive and prescribe antibiotics.  Dizziness: I think this is likely due to anemia. I'll call you with the lab results and prescribe iron supplementation   I would continue to follow up with OBGYN for polyp removal and IVF.  I have ordered the following labs today:   Lab Orders         Urinalysis, Reflex Microscopic         CBC with Diff         Ferritin         Iron and IBC ([id number])         POCT Urinalysis Dipstick ([id number])       Referrals ordered today:   Referral Orders  No referral(s) requested today     I have ordered the following medication/changed the following medications:   Stop the following medications: There are no discontinued medications.   Start the following medications: No orders of the defined types were placed in this encounter.    Follow-up: 2-3 months for routine checkup   Please make sure to arrive 15 minutes prior to your next appointment. If you arrive late, you may be asked to reschedule.   We look forward to seeing you next time. Please call our clinic at [phone] if you have any questions or concerns. The best time to call is Monday-Friday from 9am-4pm, but there is someone available 24/7. If after hours or the weekend, call the main hospital number and ask for the Internal Medicine Resident On-Call. If you need medication refills, please notify your pharmacy one week in advance and they will send [place] a request.  Thank you for letting us take part in your care. Wishing you the best!  Thank you, Lyndle Herrlich MD

## 2022-08-18 NOTE — Assessment & Plan Note (Addendum)
She has previously worked up for iron deficiency anemia and is endorsing some dizziness. Of note, she has also been having workup with OBGYN for AUB and IVF. Her periods have been abnormal the past few months. She typically has 26 day cycles and periods lasting 5 days. She has noticed she has heavier bleeding and passage of clots. Her last 3 cycles were abnormally timed: 10 day interval, 26 day interval, and currently late at 29 days. Of note, she started Gonal-F and Metropur at [facility]. She additionally had [place] and sonohysterogram showing multiple polyps.  Does have some abdominal tenderness on exam.  Orthostatics were negative today. - CBC, iron panel, ferritin. Will call in iron supplementation to her pharmacy if results show  IDA.  She says she previously tried iron supplementation but stopped because she thought her symptoms had improved.  She does not report any abnormal side effects on p.o. iron supplementation.  She also previously had iron infusions so this is also a possibility. - Continue work-up with OB/GYN for polyp removal and IVF. Advised to test for pregnancy and discuss with OBGYN if amenorrhea lasts 1-2 more weeks.  Addendum: labs c/w iron deficiency and hgb at low end of normal. In light of possible symptoms including dizziness, fatigue, and history of heavier menstruation and polyps, will go ahead and start PO iron supplementation at this time.

## 2022-08-18 NOTE — Assessment & Plan Note (Signed)
A1c recheck at next appointment

## 2022-08-18 NOTE — Progress Notes (Signed)
   CC: UTI  HPI:  Alyssa Pearson is a 36 y.o.-year-old female with past medical history as below presenting for concern for UTI.  Please see encounters tab for problem-based charting.  Past Medical History:  Diagnosis Date   Anemia    Anxiety    h/o    Arthritis    bil feet   Depression    h/o   Ectopic pregnancy 04/16/2021   GERD (gastroesophageal reflux disease)    h/o   Headache    migraines   Hypertension    Vertigo    Review of Systems: As in HPI.  Please see encounters tab for problem based charting.   Physical Exam:  Vitals:   08/18/22 0905  BP: (!) 112/94  Pulse: 75  Temp: 98.2 F (36.8 C)  TempSrc: Oral  SpO2: 99%  Weight: 224 lb 12.8 oz (102 kg)  Height: 5\' 4"  (1.626 m)   General:Well-appearing, pleasant, In NAD Cardiac: RRR, no murmurs rubs or gallops. Respiratory: Normal work of breathing on room air, CTA B Abdominal: Soft, suprapubic and right flank tenderness, nondistended   Assessment & Plan:   Urinary tract infection without hematuria Pressure when urinating and pain towards end of stream. Suprapubic pain and increased urinary frequency. No dysuria or gross hematuria. Some nausea, no vomiting, no fevers.  No dietary changes, no sick contacts. She does have suprapubic tenderness and some R flank tenderness on exam. - Urine dipstick showing positive leukocyte esterase, some blood. follow-up UA, urine culture.  Prescribed course of Keflex 250 mg 4 times daily for 5 days  Iron deficiency She has previously worked up for iron deficiency anemia and is endorsing some dizziness. Of note, she has also been having workup with OBGYN for AUB and IVF. Her periods have been abnormal the past few months. She typically has 26 day cycles and periods lasting 5 days. She has noticed she has heavier bleeding and passage of clots. Her last 3 cycles were abnormally timed: 10 day interval, 26 day interval, and currently late at 29 days. Of note, she started Gonal-F  and Metropur at [facility]. She additionally had [facility] and sonohysterogram showing multiple polyps.  Does have some abdominal tenderness on exam.  Orthostatics were negative today. - CBC, iron panel, ferritin. Will call in iron supplementation to her pharmacy if results show  IDA.  She says she previously tried iron supplementation but stopped because she thought her symptoms had improved.  She does not report any abnormal side effects on p.o. iron supplementation.  She also previously had iron infusions so this is also a possibility. - Continue work-up with OB/GYN for polyp removal and IVF. Advised to test for pregnancy and discuss with OBGYN if amenorrhea lasts 1-2 more weeks.  Hypertension Orthostatic vitals today were hypertensive.  She mentions she has not been taking her amlodipine because her blood pressure had previously improved.  Need to discuss this at next appointment after reassessing.  Pre-diabetes A1c recheck at next appointment   Patient seen with Dr. [place]

## 2022-08-18 NOTE — Assessment & Plan Note (Addendum)
Pressure when urinating and pain towards end of stream. Suprapubic pain and increased urinary frequency. No dysuria or gross hematuria. Some nausea, no vomiting, no fevers.  No dietary changes, no sick contacts. She does have suprapubic tenderness and some R flank tenderness on exam. - Urine dipstick showing positive leukocyte esterase, some blood. follow-up UA, urine culture.  Prescribed course of Keflex 250 mg 4 times daily for 5 days  Addendum: E coli UTI. Should be sensitive to course of Keflex that was already started.

## 2022-08-19 LAB — CBC WITH DIFFERENTIAL/PLATELET
Basophils Absolute: 0 10*3/uL (ref 0.0–0.2)
Basos: 0 %
EOS (ABSOLUTE): 0.1 10*3/uL (ref 0.0–0.4)
Eos: 1 %
Hematocrit: 34.3 % (ref 34.0–46.6)
Hemoglobin: 11.1 g/dL (ref 11.1–15.9)
Immature Grans (Abs): 0 10*3/uL (ref 0.0–0.1)
Immature Granulocytes: 0 %
Lymphocytes Absolute: 1.9 10*3/uL (ref 0.7–3.1)
Lymphs: 20 %
MCH: 27.5 pg (ref 26.6–33.0)
MCHC: 32.4 g/dL (ref 31.5–35.7)
MCV: 85 fL (ref 79–97)
Monocytes Absolute: 0.4 10*3/uL (ref 0.1–0.9)
Monocytes: 5 %
Neutrophils Absolute: 6.9 10*3/uL (ref 1.4–7.0)
Neutrophils: 74 %
Platelets: 298 10*3/uL (ref 150–450)
RBC: 4.03 x10E6/uL (ref 3.77–5.28)
RDW: 14.9 % (ref 11.7–15.4)
WBC: 9.4 10*3/uL (ref 3.4–10.8)

## 2022-08-19 LAB — URINALYSIS, ROUTINE W REFLEX MICROSCOPIC
Bilirubin, UA: NEGATIVE
Glucose, UA: NEGATIVE
Ketones, UA: NEGATIVE
Nitrite, UA: POSITIVE — AB
Specific Gravity, UA: 1.021 (ref 1.005–1.030)
Urobilinogen, Ur: 0.2 mg/dL (ref 0.2–1.0)
pH, UA: 6 (ref 5.0–7.5)

## 2022-08-19 LAB — MICROSCOPIC EXAMINATION
Casts: NONE SEEN /lpf
Epithelial Cells (non renal): >10 /hpf — AB (ref 0–10)
WBC, UA: >30 /hpf — AB (ref 0–5)

## 2022-08-19 LAB — IRON AND TIBC
Iron Saturation: 10 % — ABNORMAL LOW (ref 15–55)
Iron: 37 ug/dL (ref 27–159)
Total Iron Binding Capacity: 387 ug/dL (ref 250–450)
UIBC: 350 ug/dL (ref 131–425)

## 2022-08-19 LAB — FERRITIN: Ferritin: 20 ng/mL (ref 15–150)

## 2022-08-20 LAB — URINE CULTURE

## 2022-08-20 NOTE — Progress Notes (Signed)
Internal Medicine Clinic Attending  Case discussed with Dr. Sridharan  at the time of the visit.  We reviewed the resident's history and exam and pertinent patient test results.  I agree with the assessment, diagnosis, and plan of care documented in the resident's note.  

## 2022-08-22 MED ORDER — FERROUS SULFATE 325 (65 FE) MG PO TABS: 325.0000 mg | ORAL_TABLET | Freq: Every day | ORAL | 2 refills | Status: DC

## 2022-08-22 NOTE — Addendum Note (Signed)
Addended byLinus Galas on: 08/22/2022 12:43 PM   Modules accepted: Orders

## 2022-08-24 MED ORDER — FLUCONAZOLE 150 MG PO TABS: 150.0000 mg | ORAL_TABLET | ORAL | 0 refills | Status: DC | PRN

## 2022-08-24 NOTE — Addendum Note (Signed)
Addended byLinus Galas on: 08/24/2022 08:53 AM   Modules accepted: Orders

## 2022-09-21 ENCOUNTER — Ambulatory Visit (INDEPENDENT_AMBULATORY_CARE_PROVIDER_SITE_OTHER): Payer: 59 | Admitting: Family Medicine

## 2022-09-21 ENCOUNTER — Encounter: Payer: Self-pay | Admitting: Family Medicine

## 2022-09-21 VITALS — BP 138/89 | HR 65 | Ht 64.0 in | Wt 223.0 lb

## 2022-09-21 DIAGNOSIS — N840 Polyp of corpus uteri: Secondary | ICD-10-CM

## 2022-09-21 NOTE — Progress Notes (Signed)
Patient here to Establish Care   Last Pap: 04/03/21 WNL  LMP:09/02/22 lasted 5 days.   Pt was going through fertility medication treatments x 1 month.  Pt had polyps wanted removed so stopped treatments.  Pt Ectopic pregnancy in 04/2021.  Pt wants polyp removed and wants another baby.

## 2022-09-21 NOTE — Progress Notes (Signed)
Discussed with patient that we need to have records and I recommend an appt with our GYN surgeons to have pre-operative visit.

## 2022-10-26 ENCOUNTER — Encounter: Payer: 59 | Admitting: Family Medicine

## 2022-10-27 ENCOUNTER — Ambulatory Visit: Payer: 59 | Admitting: Obstetrics & Gynecology

## 2022-10-27 ENCOUNTER — Encounter: Payer: Self-pay | Admitting: Obstetrics & Gynecology

## 2022-10-27 VITALS — BP 154/88 | HR 84 | Ht 64.0 in | Wt 221.0 lb

## 2022-10-27 DIAGNOSIS — N840 Polyp of corpus uteri: Secondary | ICD-10-CM

## 2022-10-27 DIAGNOSIS — Z01818 Encounter for other preprocedural examination: Secondary | ICD-10-CM | POA: Diagnosis not present

## 2022-10-27 DIAGNOSIS — N939 Abnormal uterine and vaginal bleeding, unspecified: Secondary | ICD-10-CM | POA: Diagnosis not present

## 2022-10-27 NOTE — Progress Notes (Signed)
GYNECOLOGY OFFICE VISIT NOTE  History:   Alyssa Pearson is a 36 y.o. [phone] here today for surgical consult due to endometrial polyp seen at outside office [facility], report scanned under Media). History of AUB.  Saline sonohysterogram on 01/13/2022 showed 8 mm x 9 mm uterine polyp in upper fundus. She desires removal.  She denies any current abnormal vaginal discharge, bleeding, pelvic pain or other concerns.    Past Medical History:  Diagnosis Date   Anemia    Anxiety    h/o    Arthritis    bil feet   Depression    h/o   Ectopic pregnancy 04/16/2021   GERD (gastroesophageal reflux disease)    h/o   Headache    migraines   Hypertension    Vertigo     Past Surgical History:  Procedure Laterality Date   CHROMOPERTUBATION N/A 09/14/2019   Procedure: CHROMOPERTUBATION;  Surgeon: Natale Milch, MD;  Location: [facility] ORS;  Service: Gynecology;  Laterality: N/A;   DIAGNOSTIC LAPAROSCOPY WITH REMOVAL OF ECTOPIC PREGNANCY Right 04/16/2021   Procedure: DIAGNOSTIC LAPAROSCOPY WITH REMOVAL OF RUPTURED ECTOPIC PREGNANCY;  Surgeon: Nadara Mustard, MD;  Location: [facility] ORS;  Service: Gynecology;  Laterality: Right;   DILATION AND EVACUATION N/A 12/19/2019   Procedure: DILATATION AND EVACUATION;  Surgeon: Natale Milch, MD;  Location: [facility] ORS;  Service: Gynecology;  Laterality: N/A;   HYSTEROSCOPY WITH D & C N/A 09/14/2019   Procedure: DILATATION AND CURETTAGE /HYSTEROSCOPY, POLYPECTOMY;  Surgeon: Natale Milch, MD;  Location: [facility] ORS;  Service: Gynecology;  Laterality: N/A;   LAPAROSCOPIC UNILATERAL SALPINGECTOMY Right 04/16/2021   Procedure: LAPAROSCOPIC UNILATERAL SALPINGECTOMY;  Surgeon: Nadara Mustard, MD;  Location: [facility] ORS;  Service: Gynecology;  Laterality: Right;   LAPAROSCOPY N/A 09/14/2019   Procedure: LAPAROSCOPY DIAGNOSTIC;  Surgeon: Natale Milch, MD;  Location: [facility] ORS;  Service: Gynecology;  Laterality: N/A;   TUBAL  LIGATION     Tubal reversal  08/31/2017   WISDOM TOOTH EXTRACTION     The following portions of the patient's history were reviewed and updated as appropriate: allergies, current medications, past family history, past medical history, past social history, past surgical history and problem list.   Health Maintenance:  Normal pap and negative HRHPV on 04/03/2021.    Review of Systems:  Pertinent items noted in HPI and remainder of comprehensive ROS otherwise negative.  Physical Exam:  BP (!) 154/88   Pulse 84   Ht 5\' 4"  (1.626 m)   Wt 221 lb (100.2 kg)   BMI 37.93 kg/m  CONSTITUTIONAL: Well-developed, well-nourished female in no acute distress.  HEENT:  Normocephalic, atraumatic. External right and left ear normal. No scleral icterus.  NECK: Normal range of motion, supple, no masses noted on observation SKIN: No rash noted. Not diaphoretic. No erythema. No pallor. MUSCULOSKELETAL: Normal range of motion. No edema noted. NEUROLOGIC: Alert and oriented to person, place, and time. Normal muscle tone coordination. No cranial nerve deficit noted. PSYCHIATRIC: Normal mood and affect. Normal behavior. Normal judgment and thought content. CARDIOVASCULAR: Normal heart rate noted RESPIRATORY: Effort and breath sounds normal, no problems with respiration noted ABDOMEN: No masses noted. No other overt distention noted.   PELVIC: Deferred     Assessment and Plan:     1. Uterine polyp 2. Abnormal uterine bleeding 3. Preoperative exam for gynecologic surgery Patient will be scheduled for Hysteroscopy, Dilation and Curettage, Polypectomy. Orders signed and held.  Risks of surgery were discussed with  the patient including but not limited to: bleeding which may require transfusion; infection which may require antibiotics; injury to uterus or surrounding organs;  need for additional procedures including laparotomy or laparoscopy; and other postoperative/anesthesia complications.    Patient was told that  the likelihood that her condition and symptoms will be treated effectively with this surgical management was very high; the postoperative expectations were also discussed in detail. The patient also understands the alternative treatment options which were discussed in full. All questions were answered.  She was told that she will be contacted by our surgical scheduler regarding the time and date of her surgery; routine preoperative instructions will be given to her by the preoperative nursing team.  Printed patient education handouts about the procedure were given to the patient to review at home.  Please refer to After Visit Summary for other counseling recommendations.   Return for any gynecologic concerns.    I spent 30 minutes dedicated to the care of this patient including pre-visit review of records, face to face time with the patient discussing her conditions and treatments and post visit orders.    Jaynie Collins, MD, FACOG Obstetrician & Gynecologist, [facility] for [employer], [facility]

## 2022-11-17 ENCOUNTER — Encounter: Payer: 59 | Admitting: Internal Medicine

## 2022-11-18 ENCOUNTER — Encounter: Payer: 59 | Admitting: Internal Medicine

## 2022-11-24 ENCOUNTER — Ambulatory Visit: Payer: 59 | Admitting: Obstetrics and Gynecology

## 2022-12-09 ENCOUNTER — Ambulatory Visit: Payer: 59

## 2022-12-09 VITALS — BP 121/82 | HR 90 | Temp 98.6°F | Ht 64.0 in | Wt 222.3 lb

## 2022-12-09 DIAGNOSIS — E611 Iron deficiency: Secondary | ICD-10-CM | POA: Diagnosis not present

## 2022-12-09 DIAGNOSIS — R7303 Prediabetes: Secondary | ICD-10-CM

## 2022-12-09 LAB — POCT GLYCOSYLATED HEMOGLOBIN (HGB A1C): Hemoglobin A1C: 6.1 % — AB (ref 4.0–5.6)

## 2022-12-09 LAB — GLUCOSE, CAPILLARY: Glucose-Capillary: 107 mg/dL — ABNORMAL HIGH (ref 70–99)

## 2022-12-09 MED ORDER — POLYSACCHARIDE IRON COMPLEX 150 MG PO CAPS: 150.0000 mg | ORAL_CAPSULE | Freq: Every day | ORAL | 2 refills | Status: DC

## 2022-12-09 NOTE — Assessment & Plan Note (Signed)
Rechecking A1c today, she is working on lifestyle changes

## 2022-12-09 NOTE — Patient Instructions (Signed)
Alyssa Pearson, it was a pleasure seeing you today! You endorsed feeling well today. Below are some of the things we talked about this visit. We look forward to seeing you in the follow up appointment!  Today we discussed: I will call with your lab results. Please keep taking your iron supplements until you have your procedure and then we can discuss stopping them.  I have ordered the following labs today:   Lab Orders         Iron, TIBC and Ferritin Panel         CBC no Diff         POC Hbg A1C       Referrals ordered today:   Referral Orders  No referral(s) requested today     I have ordered the following medication/changed the following medications:   Stop the following medications: There are no discontinued medications.   Start the following medications: No orders of the defined types were placed in this encounter.    Follow-up: 3 months   Please make sure to arrive 15 minutes prior to your next appointment. If you arrive late, you may be asked to reschedule.   We look forward to seeing you next time. Please call our clinic at [phone] if you have any questions or concerns. The best time to call is Monday-Friday from 9am-4pm, but there is someone available 24/7. If after hours or the weekend, call the main hospital number and ask for the Internal Medicine Resident On-Call. If you need medication refills, please notify your pharmacy one week in advance and they will send [place] a request.  Thank you for letting us take part in your care. Wishing you the best!  Thank you, Linus Galas MD

## 2022-12-09 NOTE — Progress Notes (Signed)
   CC: IDA  HPI:  [name] is a 37 y.o.-year-old female with past medical history as below presenting for f\u IDA, uterine polyps.  Please see encounters tab for problem-based charting.  Past Medical History:  Diagnosis Date   Anemia    Anxiety    h/o    Arthritis    bil feet   Depression    h/o   Ectopic pregnancy 04/16/2021   GERD (gastroesophageal reflux disease)    h/o   Headache    migraines   Hypertension    Vertigo    Review of Systems: As in HPI.  Please see encounters tab for problem based charting.  Physical Exam:  Vitals:   12/09/22 1404  BP: 121/82  Pulse: 90  Temp: 98.6 F (37 C)  TempSrc: Oral  SpO2: 100%  Weight: 222 lb 4.8 oz (100.8 kg)  Height: 5\' 4"  (1.626 m)   General:Well-appearing, pleasant, In NAD Cardiac: RRR, no murmurs rubs or gallops. Respiratory: Normal work of breathing on room air, CTAB Abdominal: Soft, nontender, nondistended   Assessment & Plan:   Iron deficiency Endorsing resolution of symptoms after starting PO iron supplementation. Has D&C/polypectomy scheduled later this month and mentions her periods have been lighter recently as well. Will continue supplementation through this procedure and check iron levels today to assess for adequate absorption. Can consider stopping supplementation after her procedure if menorrhagia resolves. -changing supplementation from ferrous sulfate to Nu-iron 150mg  daily to minimize constipation  Pre-diabetes Rechecking A1c today, she is working on lifestyle changes   Patient discussed with Dr. Evette Doffing

## 2022-12-09 NOTE — Assessment & Plan Note (Signed)
Endorsing resolution of symptoms after starting PO iron supplementation. Has D&C/polypectomy scheduled later this month and mentions her periods have been lighter recently as well. Will continue supplementation through this procedure and check iron levels today to assess for adequate absorption. Can consider stopping supplementation after her procedure if menorrhagia resolves. -changing supplementation from ferrous sulfate to Nu-iron 150mg  daily to minimize constipation

## 2022-12-10 LAB — IRON,TIBC AND FERRITIN PANEL
Ferritin: 23 ng/mL (ref 15–150)
Iron Saturation: 12 % — ABNORMAL LOW (ref 15–55)
Iron: 42 ug/dL (ref 27–159)
Total Iron Binding Capacity: 350 ug/dL (ref 250–450)
UIBC: 308 ug/dL (ref 131–425)

## 2022-12-10 LAB — CBC
Hematocrit: 34.4 % (ref 34.0–46.6)
Hemoglobin: 11.5 g/dL (ref 11.1–15.9)
MCH: 28.2 pg (ref 26.6–33.0)
MCHC: 33.4 g/dL (ref 31.5–35.7)
MCV: 84 fL (ref 79–97)
Platelets: 293 10*3/uL (ref 150–450)
RBC: 4.08 x10E6/uL (ref 3.77–5.28)
RDW: 14.9 % (ref 11.7–15.4)
WBC: 10.9 10*3/uL — ABNORMAL HIGH (ref 3.4–10.8)

## 2022-12-10 NOTE — Progress Notes (Signed)
Internal Medicine Clinic Attending  Case discussed with Dr. Sridharan  At the time of the visit.  We reviewed the resident's history and exam and pertinent patient test results.  I agree with the assessment, diagnosis, and plan of care documented in the resident's note.  

## 2022-12-15 ENCOUNTER — Ambulatory Visit (INDEPENDENT_AMBULATORY_CARE_PROVIDER_SITE_OTHER): Payer: 59 | Admitting: Obstetrics and Gynecology

## 2022-12-23 ENCOUNTER — Encounter (HOSPITAL_BASED_OUTPATIENT_CLINIC_OR_DEPARTMENT_OTHER): Payer: Self-pay | Admitting: Obstetrics & Gynecology

## 2022-12-23 NOTE — Progress Notes (Addendum)
Spoke w/ via phone for pre-op interview---[facility] needs dos----CBC and UPT  per surgeon. EKG per anesthesia.             Lab results------ COVID test -----patient states asymptomatic no test needed Arrive at -------1130 NPO after MN NO Solid Food.  Clear liquids from MN until---1030 Med rec completed Medications to take morning of surgery -----NONE Diabetic medication ----- Patient instructed no nail polish to be worn day of surgery Patient instructed to bring photo id and insurance card day of surgery Patient aware to have Driver (ride ) / caregiver  Mother Junious Dresser  for 24 hours after surgery  Patient Special Instructions ----- Pre-Op special Istructions ----- Patient verbalized understanding of instructions that were given at this phone interview. Patient denies shortness of breath, chest pain, fever, cough at this phone interview.

## 2022-12-29 ENCOUNTER — Encounter: Payer: Self-pay | Admitting: *Deleted

## 2022-12-29 NOTE — Anesthesia Preprocedure Evaluation (Signed)
Anesthesia Evaluation  Patient identified by MRN, date of birth, ID band Patient awake    Reviewed: Allergy & Precautions, NPO status , Patient's Chart, lab work & pertinent test results  Airway Mallampati: II  TM Distance: >3 FB Neck ROM: Full    Dental no notable dental hx. (+) Teeth Intact, Dental Advisory Given   Pulmonary former smoker   Pulmonary exam normal breath sounds clear to auscultation       Cardiovascular hypertension, Normal cardiovascular exam Rhythm:Regular Rate:Normal     Neuro/Psych  Headaches PSYCHIATRIC DISORDERS Anxiety        GI/Hepatic ,GERD  ,,  Endo/Other    Renal/GU      Musculoskeletal  (+) Arthritis ,    Abdominal  (+) + obese (BMI 38.3)  Peds  Hematology  (+) Blood dyscrasia, anemia Lab Results      Component                Value               Date                      WBC                      10.9 (H)            12/09/2022                HGB                      11.5                12/09/2022                HCT                      34.4                12/09/2022                MCV                      84                  12/09/2022                PLT                      293                 12/09/2022              Anesthesia Other Findings   Reproductive/Obstetrics                             Anesthesia Physical Anesthesia Plan  ASA: 2  Anesthesia Plan: General   Post-op Pain Management: Tylenol PO (pre-op)*, Precedex and Toradol IV (intra-op)*   Induction: Intravenous  PONV Risk Score and Plan: 4 or greater and Treatment may vary due to age or medical condition, Midazolam, Dexamethasone and Ondansetron  Airway Management Planned: Oral ETT  Additional Equipment: None  Intra-op Plan:   Post-operative Plan: Extubation in OR  Informed Consent: I have reviewed the patients History and Physical, chart, labs and discussed the procedure including  the risks, benefits and alternatives for  the proposed anesthesia with the patient or authorized representative who has indicated his/her understanding and acceptance.     Dental advisory given  Plan Discussed with: CRNA, Anesthesiologist and Surgeon  Anesthesia Plan Comments:         Anesthesia Quick Evaluation

## 2022-12-30 ENCOUNTER — Encounter (HOSPITAL_BASED_OUTPATIENT_CLINIC_OR_DEPARTMENT_OTHER)
Admission: RE | Disposition: A | Discharge status: Home / Self Care | Payer: Self-pay | Source: Home / Self Care | Attending: Obstetrics & Gynecology

## 2022-12-30 ENCOUNTER — Encounter: Payer: Self-pay | Admitting: Obstetrics & Gynecology

## 2022-12-30 ENCOUNTER — Ambulatory Visit (HOSPITAL_BASED_OUTPATIENT_CLINIC_OR_DEPARTMENT_OTHER): Payer: 59 | Admitting: Anesthesiology

## 2022-12-30 ENCOUNTER — Other Ambulatory Visit: Payer: Self-pay

## 2022-12-30 ENCOUNTER — Ambulatory Visit (HOSPITAL_BASED_OUTPATIENT_CLINIC_OR_DEPARTMENT_OTHER)
Admission: RE | Admit: 2022-12-30 | Discharge: 2022-12-30 | Disposition: A | Discharge status: Home / Self Care | Payer: 59 | Attending: Obstetrics & Gynecology | Admitting: Obstetrics & Gynecology

## 2022-12-30 ENCOUNTER — Encounter (HOSPITAL_BASED_OUTPATIENT_CLINIC_OR_DEPARTMENT_OTHER): Payer: Self-pay | Admitting: Obstetrics & Gynecology

## 2022-12-30 DIAGNOSIS — Z87891 Personal history of nicotine dependence: Secondary | ICD-10-CM | POA: Insufficient documentation

## 2022-12-30 DIAGNOSIS — I10 Essential (primary) hypertension: Secondary | ICD-10-CM | POA: Insufficient documentation

## 2022-12-30 DIAGNOSIS — K219 Gastro-esophageal reflux disease without esophagitis: Secondary | ICD-10-CM | POA: Diagnosis not present

## 2022-12-30 DIAGNOSIS — N939 Abnormal uterine and vaginal bleeding, unspecified: Secondary | ICD-10-CM | POA: Diagnosis present

## 2022-12-30 DIAGNOSIS — R7303 Prediabetes: Secondary | ICD-10-CM

## 2022-12-30 DIAGNOSIS — Z6838 Body mass index (BMI) 38.0-38.9, adult: Secondary | ICD-10-CM | POA: Insufficient documentation

## 2022-12-30 DIAGNOSIS — F419 Anxiety disorder, unspecified: Secondary | ICD-10-CM | POA: Diagnosis not present

## 2022-12-30 DIAGNOSIS — M1990 Unspecified osteoarthritis, unspecified site: Secondary | ICD-10-CM | POA: Insufficient documentation

## 2022-12-30 DIAGNOSIS — E669 Obesity, unspecified: Secondary | ICD-10-CM | POA: Diagnosis not present

## 2022-12-30 DIAGNOSIS — N840 Polyp of corpus uteri: Secondary | ICD-10-CM | POA: Insufficient documentation

## 2022-12-30 HISTORY — DX: Prediabetes: R73.03

## 2022-12-30 HISTORY — PX: DILATATION & CURETTAGE/HYSTEROSCOPY WITH MYOSURE: SHX6511

## 2022-12-30 LAB — CBC
HCT: 31.6 % — ABNORMAL LOW (ref 36.0–46.0)
Hemoglobin: 10.2 g/dL — ABNORMAL LOW (ref 12.0–15.0)
MCH: 27.9 pg (ref 26.0–34.0)
MCHC: 32.3 g/dL (ref 30.0–36.0)
MCV: 86.3 fL (ref 80.0–100.0)
Platelets: 279 10*3/uL (ref 150–400)
RBC: 3.66 MIL/uL — ABNORMAL LOW (ref 3.87–5.11)
RDW: 15.4 % (ref 11.5–15.5)
WBC: 15 10*3/uL — ABNORMAL HIGH (ref 4.0–10.5)
nRBC: 0 % (ref 0.0–0.2)

## 2022-12-30 LAB — POCT PREGNANCY, URINE: Preg Test, Ur: NEGATIVE

## 2022-12-30 SURGERY — DILATATION & CURETTAGE/HYSTEROSCOPY WITH MYOSURE
Anesthesia: General | Site: Vagina

## 2022-12-30 MED ORDER — BUPIVACAINE HCL 0.5 % IJ SOLN
INTRAMUSCULAR | Status: DC | PRN
  Administered 2022-12-30: 30 mL

## 2022-12-30 MED ORDER — GABAPENTIN 300 MG PO CAPS
ORAL_CAPSULE | ORAL | Status: AC
  Filled 2022-12-30: qty 1

## 2022-12-30 MED ORDER — ONDANSETRON HCL 4 MG/2ML IJ SOLN
INTRAMUSCULAR | Status: AC
  Filled 2022-12-30: qty 2

## 2022-12-30 MED ORDER — PHENYLEPHRINE 80 MCG/ML (10ML) SYRINGE FOR IV PUSH (FOR BLOOD PRESSURE SUPPORT)
PREFILLED_SYRINGE | INTRAVENOUS | Status: DC | PRN
  Administered 2022-12-30: 160 ug via INTRAVENOUS

## 2022-12-30 MED ORDER — ONDANSETRON HCL 4 MG/2ML IJ SOLN: 4.0000 mg | Freq: Once | INTRAMUSCULAR | Status: DC | PRN

## 2022-12-30 MED ORDER — LACTATED RINGERS IV SOLN: INTRAVENOUS | Status: DC

## 2022-12-30 MED ORDER — LIDOCAINE HCL (PF) 2 % IJ SOLN
INTRAMUSCULAR | Status: AC
  Filled 2022-12-30: qty 5

## 2022-12-30 MED ORDER — CELECOXIB 200 MG PO CAPS
ORAL_CAPSULE | ORAL | Status: AC
  Filled 2022-12-30: qty 2

## 2022-12-30 MED ORDER — DEXMEDETOMIDINE HCL IN NACL 80 MCG/20ML IV SOLN
INTRAVENOUS | Status: DC | PRN
  Administered 2022-12-30: 10 ug via BUCCAL

## 2022-12-30 MED ORDER — GABAPENTIN 300 MG PO CAPS
300.0000 mg | ORAL_CAPSULE | ORAL | Status: AC
  Administered 2022-12-30: 300 mg via ORAL

## 2022-12-30 MED ORDER — CELECOXIB 200 MG PO CAPS
400.0000 mg | ORAL_CAPSULE | ORAL | Status: AC
  Administered 2022-12-30: 200 mg via ORAL

## 2022-12-30 MED ORDER — ONDANSETRON HCL 4 MG/2ML IJ SOLN
INTRAMUSCULAR | Status: DC | PRN
  Administered 2022-12-30: 4 mg via INTRAVENOUS

## 2022-12-30 MED ORDER — DEXAMETHASONE SODIUM PHOSPHATE 10 MG/ML IJ SOLN
INTRAMUSCULAR | Status: DC | PRN
  Administered 2022-12-30: 10 mg via INTRAVENOUS

## 2022-12-30 MED ORDER — HYDROMORPHONE HCL 1 MG/ML IJ SOLN: 0.2500 mg | INTRAMUSCULAR | Status: DC | PRN

## 2022-12-30 MED ORDER — MIDAZOLAM HCL 2 MG/2ML IJ SOLN
INTRAMUSCULAR | Status: AC
  Filled 2022-12-30: qty 2

## 2022-12-30 MED ORDER — MIDAZOLAM HCL 5 MG/5ML IJ SOLN
INTRAMUSCULAR | Status: DC | PRN
  Administered 2022-12-30: 2 mg via INTRAVENOUS

## 2022-12-30 MED ORDER — KETOROLAC TROMETHAMINE 30 MG/ML IJ SOLN: 30.0000 mg | Freq: Once | INTRAMUSCULAR | Status: DC | PRN

## 2022-12-30 MED ORDER — PROPOFOL 10 MG/ML IV BOLUS
INTRAVENOUS | Status: DC | PRN
  Administered 2022-12-30: 150 mg via INTRAVENOUS

## 2022-12-30 MED ORDER — FENTANYL CITRATE (PF) 100 MCG/2ML IJ SOLN
INTRAMUSCULAR | Status: AC
  Filled 2022-12-30: qty 2

## 2022-12-30 MED ORDER — SODIUM CHLORIDE 0.9 % IR SOLN
Status: DC | PRN
  Administered 2022-12-30: 3000 mL

## 2022-12-30 MED ORDER — IBUPROFEN 800 MG PO TABS: 800.0000 mg | ORAL_TABLET | Freq: Three times a day (TID) | ORAL | 3 refills | Status: DC | PRN

## 2022-12-30 MED ORDER — EPHEDRINE 5 MG/ML INJ
INTRAVENOUS | Status: AC
  Filled 2022-12-30: qty 5

## 2022-12-30 MED ORDER — ACETAMINOPHEN 500 MG PO TABS
ORAL_TABLET | ORAL | Status: AC
  Filled 2022-12-30: qty 2

## 2022-12-30 MED ORDER — PHENYLEPHRINE 80 MCG/ML (10ML) SYRINGE FOR IV PUSH (FOR BLOOD PRESSURE SUPPORT)
PREFILLED_SYRINGE | INTRAVENOUS | Status: AC
  Filled 2022-12-30: qty 10

## 2022-12-30 MED ORDER — OXYCODONE HCL 5 MG PO TABS: 5.0000 mg | ORAL_TABLET | Freq: Four times a day (QID) | ORAL | 0 refills | Status: DC | PRN

## 2022-12-30 MED ORDER — LIDOCAINE 2% (20 MG/ML) 5 ML SYRINGE
INTRAMUSCULAR | Status: DC | PRN
  Administered 2022-12-30: 100 mg via INTRAVENOUS

## 2022-12-30 MED ORDER — OXYCODONE HCL 5 MG PO TABS
ORAL_TABLET | ORAL | Status: AC
  Filled 2022-12-30: qty 1

## 2022-12-30 MED ORDER — ACETAMINOPHEN 500 MG PO TABS
1000.0000 mg | ORAL_TABLET | Freq: Once | ORAL | Status: AC
  Administered 2022-12-30: 1000 mg via ORAL

## 2022-12-30 MED ORDER — DEXAMETHASONE SODIUM PHOSPHATE 10 MG/ML IJ SOLN
INTRAMUSCULAR | Status: AC
  Filled 2022-12-30: qty 1

## 2022-12-30 MED ORDER — OXYCODONE HCL 5 MG/5ML PO SOLN: 5.0000 mg | Freq: Once | ORAL | Status: AC | PRN

## 2022-12-30 MED ORDER — OXYCODONE HCL 5 MG PO TABS
5.0000 mg | ORAL_TABLET | Freq: Once | ORAL | Status: AC | PRN
  Administered 2022-12-30: 5 mg via ORAL

## 2022-12-30 MED ORDER — DOCUSATE SODIUM 100 MG PO CAPS: 100.0000 mg | ORAL_CAPSULE | Freq: Two times a day (BID) | ORAL | 2 refills | Status: DC | PRN

## 2022-12-30 MED ORDER — FENTANYL CITRATE (PF) 100 MCG/2ML IJ SOLN
INTRAMUSCULAR | Status: DC | PRN
  Administered 2022-12-30 (×4): 25 ug via INTRAVENOUS

## 2022-12-30 MED ORDER — ACETAMINOPHEN 500 MG PO TABS: 1000.0000 mg | ORAL_TABLET | ORAL | Status: DC

## 2022-12-30 SURGICAL SUPPLY — 15 items
CATH ROBINSON RED A/P 16FR (CATHETERS) ×1 IMPLANT
DEVICE MYOSURE LITE (MISCELLANEOUS) IMPLANT
DEVICE MYOSURE REACH (MISCELLANEOUS) IMPLANT
DRSG TELFA 3X8 NADH STRL (GAUZE/BANDAGES/DRESSINGS) ×1 IMPLANT
GAUZE 4X4 16PLY ~~LOC~~+RFID DBL (SPONGE) ×1 IMPLANT
GLOVE BIOGEL PI IND STRL 7.0 (GLOVE) ×2 IMPLANT
GLOVE ECLIPSE 7.0 STRL STRAW (GLOVE) ×1 IMPLANT
GOWN STRL REUS W/TWL LRG LVL3 (GOWN DISPOSABLE) ×2 IMPLANT
KIT PROCEDURE FLUENT (KITS) ×1 IMPLANT
KIT TURNOVER CYSTO (KITS) ×1 IMPLANT
PACK VAGINAL MINOR WOMEN LF (CUSTOM PROCEDURE TRAY) ×1 IMPLANT
PAD OB MATERNITY 4.3X12.25 (PERSONAL CARE ITEMS) ×1 IMPLANT
SEAL ROD LENS SCOPE MYOSURE (ABLATOR) ×1 IMPLANT
TOWEL OR 17X26 10 PK STRL BLUE (TOWEL DISPOSABLE) ×1 IMPLANT
UNDERPAD 30X36 HEAVY ABSORB (UNDERPADS AND DIAPERS) ×1 IMPLANT

## 2022-12-30 NOTE — H&P (Signed)
Preoperative History and Physical  Alyssa Pearson is a 37 y.o. [id number] here for surgical management of endometrial polyp and AUB.  Saline sonohysterogram on 01/13/2022 showed 8 mm x 9 mm uterine polyp in upper fundus.  No significant preoperative concerns.  Proposed surgery: Hysteroscopy, Dilation and Curettage, Polypectomy.   Past Medical History:  Diagnosis Date   Anemia    Anxiety    h/o    Arthritis    bil feet   Depression    h/o   Ectopic pregnancy 04/16/2021   GERD (gastroesophageal reflux disease)    h/o   Headache    migraines   Hypertension    Pre-diabetes    Vertigo    Past Surgical History:  Procedure Laterality Date   CHROMOPERTUBATION N/A 09/14/2019   Procedure: CHROMOPERTUBATION;  Surgeon: Homero Fellers, MD;  Location: [facility] ORS;  Service: Gynecology;  Laterality: N/A;   DIAGNOSTIC LAPAROSCOPY WITH REMOVAL OF ECTOPIC PREGNANCY Right 04/16/2021   Procedure: DIAGNOSTIC LAPAROSCOPY WITH REMOVAL OF RUPTURED ECTOPIC PREGNANCY;  Surgeon: Gae Dry, MD;  Location: [facility] ORS;  Service: Gynecology;  Laterality: Right;   DILATION AND EVACUATION N/A 12/19/2019   Procedure: DILATATION AND EVACUATION;  Surgeon: Homero Fellers, MD;  Location: [facility] ORS;  Service: Gynecology;  Laterality: N/A;   HYSTEROSCOPY WITH D & C N/A 09/14/2019   Procedure: DILATATION AND CURETTAGE /HYSTEROSCOPY, POLYPECTOMY;  Surgeon: Homero Fellers, MD;  Location: [facility] ORS;  Service: Gynecology;  Laterality: N/A;   LAPAROSCOPIC UNILATERAL SALPINGECTOMY Right 04/16/2021   Procedure: LAPAROSCOPIC UNILATERAL SALPINGECTOMY;  Surgeon: Gae Dry, MD;  Location: [facility] ORS;  Service: Gynecology;  Laterality: Right;   LAPAROSCOPY N/A 09/14/2019   Procedure: LAPAROSCOPY DIAGNOSTIC;  Surgeon: Homero Fellers, MD;  Location: [facility] ORS;  Service: Gynecology;  Laterality: N/A;   TUBAL LIGATION     Tubal reversal  08/31/2017   WISDOM TOOTH EXTRACTION     OB History  Gravida  Para Term Preterm AB Living  8 3 3   3 3   SAB IAB Ectopic Multiple Live Births  3       3    # Outcome Date GA Lbr Len/2nd Weight Sex Delivery Anes PTL Lv  8 Gravida           7 SAB 12/19/19 [redacted]w[redacted]d         6 Term 11/04/09 [redacted]w[redacted]d  3459 g M Vag-Spont   LIV  5 Term 02/19/08 [redacted]w[redacted]d  3232 g F Vag-Spont   LIV  4 SAB 10/21/06          3 Term 04/30/05 [redacted]w[redacted]d  2778 g F Vag-Spont   LIV  2 SAB 09/20/02          1 Gravida           Patient denies any other pertinent gynecologic issues.   No current facility-administered medications on file prior to encounter.   Current Outpatient Medications on File Prior to Encounter  Medication Sig Dispense Refill   ascorbic acid (VITAMIN C) 500 MG tablet Take 500 mg by mouth daily.     Prenatal Vit-Fe Fumarate-FA (MULTIVITAMIN-PRENATAL) 27-0.8 MG TABS tablet Take 1 tablet by mouth daily.      amLODipine (NORVASC) 5 MG tablet Take 1 tablet (5 mg total) by mouth daily. (Patient not taking: Reported on 09/21/2022) 90 tablet 3   letrozole (FEMARA) 2.5 MG tablet Take by mouth. (Patient not taking: Reported on 09/21/2022)     medroxyPROGESTERone (PROVERA) 10 MG tablet  Take 1 tablet (10 mg total) by mouth daily. 30 tablet 0   No Known Allergies  Social History:   reports that she quit smoking about 13 years ago. Her smoking use included cigarettes. She has a 3.50 pack-year smoking history. She has never used smokeless tobacco. She reports that she does not currently use alcohol. She reports that she does not use drugs.  Family History  Problem Relation Age of Onset   Hypertension Mother    Diabetes Father    Hypertension Father     Review of Systems: Pertinent items noted in HPI and remainder of comprehensive ROS otherwise negative.  PHYSICAL EXAM: Blood pressure (!) 138/94, pulse (!) 103, temperature (!) 96.8 F (36 C), temperature source Oral, resp. rate 17, height 5\' 4"  (1.626 m), weight 99.5 kg, last menstrual period 12/18/2022, SpO2 99 %. CONSTITUTIONAL:  Well-developed, well-nourished female in no acute distress.  HENT:  Normocephalic, atraumatic, External right and left ear normal. Oropharynx is clear and moist EYES: Conjunctivae and EOM are normal. Pupils are equal, round, and reactive to light. No scleral icterus.  NECK: Normal range of motion, supple, no masses SKIN: Skin is warm and dry. No rash noted. Not diaphoretic. No erythema. No pallor. NEUROLOGIC: Alert and oriented to person, place, and time. Normal reflexes, muscle tone coordination. No cranial nerve deficit noted. PSYCHIATRIC: Normal mood and affect. Normal behavior. Normal judgment and thought content. CARDIOVASCULAR: Normal heart rate noted, regular rhythm RESPIRATORY: Effort and breath sounds normal, no problems with respiration noted ABDOMEN: Soft, nontender, nondistended. PELVIC: Deferred MUSCULOSKELETAL: Normal range of motion. No edema and no tenderness. 2+ distal pulses.  Labs: Results for orders placed or performed during the hospital encounter of 12/30/22 (from the past 336 hour(s))  Pregnancy, urine POC   Collection Time: 12/30/22 11:42 AM  Result Value Ref Range   Preg Test, Ur NEGATIVE NEGATIVE    Assessment: Principal Problem:   Abnormal uterine bleeding due to endometrial polyp   Plan: Patient will undergo surgical management with Hysteroscopy, Dilation and Curettage, Polypectomy.   The risks of surgery were discussed in detail with the patient including but not limited to: bleeding which may require transfusion; infection which may require antibiotics; injury to uterus or surrounding organs; need for additional procedures including laparotomy or laparoscopy; and other postoperative/anesthesia complications.  Likelihood of success in alleviating the patient's condition was discussed. Routine postoperative instructions will be reviewed with the patient and her family in detail after surgery.  The patient concurred with the proposed plan, giving informed written  consent for the surgery.  Patient has been NPO since last night and she will remain NPO for procedure.  Anesthesia and OR aware.  Preoperative prophylactic antibiotics and SCDs ordered on call to the OR.  To OR when ready.    Verita Schneiders, MD, [place] for [employer], [place]

## 2022-12-30 NOTE — Anesthesia Postprocedure Evaluation (Signed)
Anesthesia Post Note  Patient: Alyssa Pearson  Procedure(s) Performed: DILATATION & CURETTAGE/HYSTEROSCOPY WITH MYOSURE (Vagina )     Patient location during evaluation: PACU Anesthesia Type: General Level of consciousness: awake and alert Pain management: pain level controlled Vital Signs Assessment: post-procedure vital signs reviewed and stable Respiratory status: spontaneous breathing, nonlabored ventilation, respiratory function stable and patient connected to nasal cannula oxygen Cardiovascular status: blood pressure returned to baseline and stable Postop Assessment: no apparent nausea or vomiting Anesthetic complications: no  No notable events documented.  Last Vitals:  Vitals:   12/30/22 1420 12/30/22 1430  BP:    Pulse: 82 78  Resp:    Temp:    SpO2: 100% 97%    Last Pain:  Vitals:   12/30/22 1415  TempSrc:   PainSc: 0-No pain                 Barnet Glasgow

## 2022-12-30 NOTE — Transfer of Care (Signed)
Immediate Anesthesia Transfer of Care Note  Patient: Alyssa Pearson  Procedure(s) Performed: DILATATION & CURETTAGE/HYSTEROSCOPY WITH MYOSURE (Vagina )  Patient Location: PACU  Anesthesia Type:General  Level of Consciousness: drowsy and patient cooperative  Airway & Oxygen Therapy: Patient Spontanous Breathing and Patient connected to face mask oxygen  Post-op Assessment: Report given to RN and Post -op Vital signs reviewed and stable  Post vital signs: Reviewed and stable  Last Vitals:  Vitals Value Taken Time  BP 123/59 12/30/22 1323  Temp 37.2 C 12/30/22 1323  Pulse 78 12/30/22 1330  Resp 21 12/30/22 1330  SpO2 100 % 12/30/22 1330  Vitals shown include unvalidated device data.  Last Pain:  Vitals:   12/30/22 1323  TempSrc:   PainSc: Asleep      Patients Stated Pain Goal: 4 (84/13/24 4010)  Complications: No notable events documented.

## 2022-12-30 NOTE — Op Note (Signed)
PREOPERATIVE DIAGNOSIS:  Abnormal uterine bleeding due to endometrial polyp POSTOPERATIVE DIAGNOSIS: The same PROCEDURE: Hysteroscopy, Dilation and Curettage, Polypectomy using the Myosure device SURGEON:  Dr. Verita Schneiders  INDICATIONS: 38 y.o. [id number]  here for scheduled surgery for the aforementioned diagnoses.   Risks of surgery were discussed with the patient including but not limited to: bleeding which may require transfusion; infection which may require antibiotics; injury to uterus or surrounding organs; intrauterine scarring which may impair future fertility; need for additional procedures including laparotomy or laparoscopy; and other postoperative/anesthesia complications. Written informed consent was obtained.    FINDINGS:  A 11 week size uterus.  Diffuse proliferative endometrium. 1 cm endometrial polyp in upper fundal area.  Normal ostia bilaterally.  ANESTHESIA:   General, paracervical block with 30 ml of 0.5% Marcaine FLUID DEFICITS:  110 ml of NS ESTIMATED BLOOD LOSS:  25 ml SPECIMENS: Endometrial polyp fragments and curettings sent to pathology COMPLICATIONS:  None immediate.  PROCEDURE DETAILS:  The patient was then taken to the operating room where general anesthesia was administered and was found to be adequate.  After an adequate timeout was performed, she was placed in the dorsal lithotomy position and examined; then prepped and draped in the sterile manner.   Her bladder was catheterized for an unmeasured amount of clear, yellow urine. A speculum was then placed in the patient's vagina and a single tooth tenaculum was applied to the anterior lip of the cervix.   A paracervical block using 30 ml of 0.5% Marcaine was administered.  The uterus was sounded to 11 cm and the cervix was dilated manually with metal dilators to accommodate the Myosure operative hysteroscope.  The hysteroscope was then inserted under direct visualization using NS as a suspension medium.  The uterine  cavity was carefully examined with the findings as noted above.   After further careful visualization of the uterine cavity, The Myosure Reach device was used to excise the polyp and do targeted curettage of the the proliferative endometrium all throughout the cavity.   The hysteroscope was removed under direct visualization.  A quick sharp curettage was also performed to obtain residual endometrial curettings.  The tenaculum was removed from the anterior lip of the cervix and the vaginal speculum was removed after noting good hemostasis.  The patient tolerated the procedure well and was taken to the recovery area awake, extubated and in stable condition.  The patient will be discharged to home as per PACU criteria.  Routine postoperative instructions given.  She was prescribed Oxycodone, Ibuprofen and Colace.  She will follow up in the clinic in 2-3 weeks for postoperative evaluation.   Verita Schneiders, MD, [place] for [employer], [place]

## 2022-12-30 NOTE — Discharge Instructions (Signed)
   D & C Home care Instructions:   Personal hygiene:  Used sanitary napkins for vaginal drainage not tampons. Shower or tub bathe the day after your procedure. No douching until bleeding stops. Always wipe from front to back after  Elimination.  Activity: Do not drive or operate any equipment today. The effects of the anesthesia are still present and drowsiness may result. Rest today, not necessarily flat bed rest, just take it easy. You may resume your normal activity in one to 2 days.  Sexual activity: No intercourse for one week or as indicated by your physician  Diet: Eat a light diet as desired this evening. You may resume a regular diet tomorrow.  Return to work: One to 2 days.  General Expectations of your surgery: Vaginal bleeding should be no heavier than a normal period. Spotting may continue up to 10 days. Mild cramps may continue for a couple of days. You may have a regular period in 2-6 weeks.  Unexpected observations call your doctor if these occur: persistent or heavy bleeding. Severe abdominal cramping or pain. Elevation of temperature greater than 100F. Inability to urinate.  Call for an appointment in one week.     Post Anesthesia Home Care Instructions  Activity: Get plenty of rest for the remainder of the day. A responsible individual must stay with you for 24 hours following the procedure.  For the next 24 hours, DO NOT: -Drive a car -Paediatric nurse -Drink alcoholic beverages -Take any medication unless instructed by your physician -Make any legal decisions or sign important papers.  Meals: Start with liquid foods such as gelatin or soup. Progress to regular foods as tolerated. Avoid greasy, spicy, heavy foods. If nausea and/or vomiting occur, drink only clear liquids until the nausea and/or vomiting subsides. Call your physician if vomiting continues.  Special Instructions/Symptoms: Your throat may feel dry or sore from the anesthesia or the breathing  tube placed in your throat during surgery. If this causes discomfort, gargle with warm salt water. The discomfort should disappear within 24 hours.   No acetaminophen/Tylenol until after 6 pm today if needed. No ibuprofen, Advil, Aleve, Motrin, ketorolac, meloxicam, naproxen, or other NSAIDS until after 6 pm today if needed.

## 2022-12-30 NOTE — Anesthesia Procedure Notes (Signed)
Procedure Name: LMA Insertion Date/Time: 12/30/2022 12:53 PM  Performed by: Genelle Bal, CRNAPre-anesthesia Checklist: Patient identified, Emergency Drugs available, Suction available and Patient being monitored Patient Re-evaluated:Patient Re-evaluated prior to induction Oxygen Delivery Method: Circle system utilized Preoxygenation: Pre-oxygenation with 100% oxygen Induction Type: IV induction Ventilation: Mask ventilation without difficulty LMA: LMA inserted LMA Size: 4.0 Number of attempts: 1 Airway Equipment and Method: Bite block Placement Confirmation: positive ETCO2 Tube secured with: Tape Dental Injury: Teeth and Oropharynx as per pre-operative assessment

## 2022-12-31 ENCOUNTER — Encounter (HOSPITAL_BASED_OUTPATIENT_CLINIC_OR_DEPARTMENT_OTHER): Payer: Self-pay | Admitting: Obstetrics & Gynecology

## 2022-12-31 LAB — SURGICAL PATHOLOGY

## 2023-01-26 ENCOUNTER — Ambulatory Visit (INDEPENDENT_AMBULATORY_CARE_PROVIDER_SITE_OTHER): Payer: 59 | Admitting: Obstetrics & Gynecology

## 2023-01-26 ENCOUNTER — Encounter: Payer: Self-pay | Admitting: Obstetrics & Gynecology

## 2023-01-26 VITALS — BP 142/88 | HR 73 | Wt 214.2 lb

## 2023-01-26 DIAGNOSIS — I10 Essential (primary) hypertension: Secondary | ICD-10-CM | POA: Diagnosis not present

## 2023-01-26 DIAGNOSIS — Z09 Encounter for follow-up examination after completed treatment for conditions other than malignant neoplasm: Secondary | ICD-10-CM | POA: Diagnosis not present

## 2023-01-26 NOTE — Progress Notes (Signed)
   POSTOPERATIVE VISIT  Subjective:     Alyssa Pearson is a 37 y.o. female who presents to the clinic status post Hysteroscopy, Dilation and Curettage, Polypectomy using the Myosure device on 12/30/2022 due to abnormal uterine bleeding due to endometrial polyp.  Eating a regular diet without difficulty. Bowel movements are normal. The patient is not having any pain. Had light bleeding for over three weeks but finally stopped.    The following portions of the patient's history were reviewed and updated as appropriate: allergies, current medications, past family history, past medical history, past social history, past surgical history, and problem list.  Review of Systems Pertinent items noted in HPI and remainder of comprehensive ROS otherwise negative.    Objective:   Vitals:   01/26/23 0848 01/26/23 0908  BP: (!) 161/78 (!) 142/88  Blood pressure (!) 142/88, pulse 73, weight 214 lb 3.2 oz (97.2 kg), last menstrual period 12/18/2022.  General:  alert and no distress  Abdomen: soft, bowel sounds active, non-tender  Pelvic :   deferred    12/30/2022 Surgical Pathology ENDOMETRIUM AND POLYP, CURETTAGE:  Benign endometrial polyp  Benign proliferative phase endometrium  Negative for breakdown, cytologic atypia, hyperplasia and carcinoma.   Assessment:    Doing well postoperatively. Operative findings again reviewed. Pathology report discussed.    Plan:    1. Continue any current medications.  Patient did not take her BP medication. She was encouraged to take her BP medication as prescribed given her elevated BPs, and follow up with her PCP. 2. Wound care discussed. 3. Activity restrictions: none 4. Anticipated return to work: not applicable. 5. Follow up as needed.   Verita Schneiders, MD, [place] for [employer], [place]

## 2023-04-26 ENCOUNTER — Ambulatory Visit: Payer: 59 | Admitting: Obstetrics & Gynecology

## 2023-05-05 ENCOUNTER — Encounter: Payer: Self-pay | Admitting: Emergency Medicine

## 2023-05-05 ENCOUNTER — Emergency Department: Payer: 59

## 2023-05-05 ENCOUNTER — Emergency Department
Admission: EM | Admit: 2023-05-05 | Discharge: 2023-05-05 | Disposition: A | Discharge status: Home / Self Care | Payer: 59 | Attending: Emergency Medicine | Admitting: Emergency Medicine

## 2023-05-05 DIAGNOSIS — J029 Acute pharyngitis, unspecified: Secondary | ICD-10-CM | POA: Diagnosis present

## 2023-05-05 DIAGNOSIS — J020 Streptococcal pharyngitis: Secondary | ICD-10-CM | POA: Insufficient documentation

## 2023-05-05 DIAGNOSIS — Z20822 Contact with and (suspected) exposure to covid-19: Secondary | ICD-10-CM | POA: Insufficient documentation

## 2023-05-05 LAB — RESP PANEL BY RT-PCR (RSV, FLU A&B, COVID)  RVPGX2
Influenza A by PCR: NEGATIVE
Influenza B by PCR: NEGATIVE
Resp Syncytial Virus by PCR: NEGATIVE
SARS Coronavirus 2 by RT PCR: NEGATIVE

## 2023-05-05 LAB — GROUP A STREP BY PCR: Group A Strep by PCR: DETECTED — AB

## 2023-05-05 MED ORDER — LIDOCAINE VISCOUS HCL 2 % MT SOLN: 10.0000 mL | OROMUCOSAL | 0 refills | Status: DC | PRN

## 2023-05-05 MED ORDER — AMOXICILLIN 875 MG PO TABS: 875.0000 mg | ORAL_TABLET | Freq: Two times a day (BID) | ORAL | 0 refills | Status: DC

## 2023-05-05 MED ORDER — AMOXICILLIN 500 MG PO CAPS
1000.0000 mg | ORAL_CAPSULE | Freq: Once | ORAL | Status: AC
  Administered 2023-05-05: 1000 mg via ORAL
  Filled 2023-05-05: qty 2

## 2023-05-05 NOTE — ED Triage Notes (Signed)
Pt presents ambulatory to triage via POV with complaints of L sided neck pain since Sunday. Pt has tried OTC cold medication because she thought it was sinus pain but has had no relief. Rates the pain 8/10. A&Ox4 at this time. Denies fevers, chills, injury, CP or SOB.

## 2023-05-05 NOTE — ED Provider Notes (Signed)
[facility] Provider Note  Patient Contact: 10:36 PM (approximate)   History   Torticollis   HPI  Alyssa Pearson is a 37 y.o. female who presents emergency department complaining of congestion, sore throat, "neck pain.  Patient is having pain along the left side of the neck.  No difficulty breathing or swallowing, no voice changes.  No known sick contacts.     Physical Exam   Triage Vital Signs: ED Triage Vitals  Enc Vitals Group     BP 05/05/23 2148 136/82     Pulse Rate 05/05/23 2148 89     Resp 05/05/23 2148 17     Temp 05/05/23 2148 98.7 F (37.1 C)     Temp Source 05/05/23 2148 Oral     SpO2 05/05/23 2148 96 %     Weight 05/05/23 2151 215 lb (97.5 kg)     Height 05/05/23 2151 5\' 4"  (1.626 m)     Head Circumference --      Peak Flow --      Pain Score 05/05/23 2151 8     Pain Loc --      Pain Edu? --      Excl. in GC? --     Most recent vital signs: Vitals:   05/05/23 2148  BP: 136/82  Pulse: 89  Resp: 17  Temp: 98.7 F (37.1 C)  SpO2: 96%     General: Alert and in no acute distress. ENT:      Ears:       Nose: No congestion/rhinnorhea.      Mouth/Throat: Mucous membranes are moist.  Equal tonsillar hypertrophy with what appears to be faint exudates worse on the left than right.  Uvula is midline. Neck: No stridor. No cervical spine tenderness to palpation.  Negative Kernig's and presents came Hematological/Lymphatic/Immunilogical: Diffuse, scattered, tender anterior cervical lymphadenopathy. Cardiovascular:  Good peripheral perfusion Respiratory: Normal respiratory effort without tachypnea or retractions. Lungs CTAB. Musculoskeletal: Full range of motion to all extremities.  Neurologic:  No gross focal neurologic deficits are appreciated.  Skin:   No rash noted Other:   ED Results / Procedures / Treatments   Labs (all labs ordered are listed, but only abnormal results are displayed) Labs Reviewed  RESP PANEL BY  RT-PCR (RSV, FLU A&B, COVID)  RVPGX2  GROUP A STREP BY PCR     EKG     RADIOLOGY    No results found.  PROCEDURES:  Critical Care performed: No  Procedures   MEDICATIONS ORDERED IN ED: Medications  amoxicillin (AMOXIL) capsule 1,000 mg (has no administration in time range)     IMPRESSION / MDM / ASSESSMENT AND PLAN / ED COURSE  I reviewed the triage vital signs and the nursing notes.                                 Differential diagnosis includes, but is not limited to, strep pharyngitis, viral illness, COVID, flu, meningitis, tonsillar abscess   Patient's presentation is most consistent with acute presentation with potential threat to life or bodily function.   Patient's diagnosis is consistent with strep.  Patient presents emergency department with findings concerning for strep.  She had some described neck pain that appears to be an distribution of lymph nodes.  There is no neck stiffness or rigidity concerning for meningitis.  She does have other infectious symptoms.  Afebrile on arrival.  No concern  at this time for retropharyngeal or peritonsillar abscesses.  Antibiotics empirically at this time.  Patient will have COVID, flu, RSV and strep swabs.  Follow-up primary care as needed.  Strict return cautions discussed with patient as needed..  Patient is given ED precautions to return to the ED for any worsening or new symptoms.     FINAL CLINICAL IMPRESSION(S) / ED DIAGNOSES   Final diagnoses:  Strep pharyngitis     Rx / DC Orders   ED Discharge Orders          Ordered    amoxicillin (AMOXIL) 875 MG tablet  2 times daily        05/05/23 2238    lidocaine (XYLOCAINE) 2 % solution  Every 4 hours PRN        05/05/23 2238             Note:  This document was prepared using Dragon voice recognition software and may include unintentional dictation errors.   Lanette Hampshire 05/05/23 2238    Trinna Post, MD 05/06/23 [phone]

## 2023-05-11 ENCOUNTER — Encounter: Payer: Self-pay | Admitting: *Deleted

## 2023-05-19 ENCOUNTER — Other Ambulatory Visit (HOSPITAL_COMMUNITY)
Admission: RE | Admit: 2023-05-19 | Discharge: 2023-05-19 | Disposition: A | Discharge status: Home / Self Care | Payer: 59 | Source: Ambulatory Visit | Attending: Obstetrics & Gynecology | Admitting: Obstetrics & Gynecology

## 2023-05-19 ENCOUNTER — Encounter: Payer: Self-pay | Admitting: Obstetrics and Gynecology

## 2023-05-19 ENCOUNTER — Ambulatory Visit (INDEPENDENT_AMBULATORY_CARE_PROVIDER_SITE_OTHER): Payer: 59 | Admitting: Obstetrics and Gynecology

## 2023-05-19 VITALS — BP 139/87 | HR 76 | Wt 212.2 lb

## 2023-05-19 DIAGNOSIS — Z01419 Encounter for gynecological examination (general) (routine) without abnormal findings: Secondary | ICD-10-CM | POA: Insufficient documentation

## 2023-05-19 DIAGNOSIS — Z202 Contact with and (suspected) exposure to infections with a predominantly sexual mode of transmission: Secondary | ICD-10-CM

## 2023-05-19 DIAGNOSIS — N898 Other specified noninflammatory disorders of vagina: Secondary | ICD-10-CM | POA: Diagnosis not present

## 2023-05-19 DIAGNOSIS — Z113 Encounter for screening for infections with a predominantly sexual mode of transmission: Secondary | ICD-10-CM

## 2023-05-19 NOTE — Progress Notes (Signed)
Patient presents for Annual.  LMP: 04/25/23  Last pap: Date: 04/03/21 Contraception: None/ trying to get pregnant   Mammogram: Not yet indicated STD Screening: Accepts Flu Vaccine : Declines  CC:  Annual  / wants STD blood work also  Also wants to be swabbed for Yeast infection

## 2023-05-19 NOTE — Progress Notes (Signed)
Obstetrics and Gynecology Annual Patient Evaluation  Appointment Date: 05/19/2023  OBGYN Clinic: [facility]  Primary Care Provider: Lyndle Herrlich  Chief Complaint:  Chief Complaint  Patient presents with   Gynecologic Exam  Annual Vaginal discharge STD exposure  History of Present Illness: Alyssa Pearson is a 37 y.o. African-American [phone] (Patient's last menstrual period was 04/25/2023 (exact date).), seen for the above chief complaint. Her past medical history is significant for endometrial polyps and AUB, h/o polypectomy (early 2024 and in early 2020s), h/o right salpingectomy for ectopic after h/o BTL reversal, HTN   Patient still trying to conceive. She had two periods in March but February, April, and May were one week, not heavy or painful; she had her myosure polypectomy in January  Review of Systems: Pertinent items noted in HPI and remainder of comprehensive ROS otherwise negative.   Patient Active Problem List   Diagnosis Date Noted   Abnormal uterine bleeding due to endometrial polyp 12/30/2022   Urinary tract infection without hematuria 08/18/2022   Iron deficiency 07/04/2021   Obesity, Class II, BMI 35-39.9 07/04/2021   Cough 07/04/2021   Hypertension 08/05/2020   Pre-diabetes 07/10/2020   Finger pain, left 07/10/2020   Elevated troponin 04/30/2020   Depression 04/30/2020   History of reversal of tubal ligation 09/17/2017   Past Medical History:  Past Medical History:  Diagnosis Date   Anemia    Anxiety    h/o    Arthritis    bil feet   Depression    h/o   Ectopic pregnancy 04/16/2021   GERD (gastroesophageal reflux disease)    h/o   Headache    migraines   Hypertension    Pre-diabetes    Vertigo     Past Surgical History:  Past Surgical History:  Procedure Laterality Date   CHROMOPERTUBATION N/A 09/14/2019   Procedure: CHROMOPERTUBATION;  Surgeon: Natale Milch, MD;  Location: [facility];   Service: Gynecology;  Laterality: N/A;   DIAGNOSTIC LAPAROSCOPY WITH REMOVAL OF ECTOPIC PREGNANCY Right 04/16/2021   Procedure: DIAGNOSTIC LAPAROSCOPY WITH REMOVAL OF RUPTURED ECTOPIC PREGNANCY;  Surgeon: Nadara Mustard, MD;  Location: [facility];  Service: Gynecology;  Laterality: Right;   DILATATION & CURETTAGE/HYSTEROSCOPY WITH MYOSURE N/A 12/30/2022   Procedure: DILATATION & CURETTAGE/HYSTEROSCOPY WITH MYOSURE;  Surgeon: Tereso Newcomer, MD;  Location: [facility];  Service: Gynecology;  Laterality: N/A;   DILATION AND EVACUATION N/A 12/19/2019   Procedure: DILATATION AND EVACUATION;  Surgeon: Natale Milch, MD;  Location: [facility];  Service: Gynecology;  Laterality: N/A;   HYSTEROSCOPY WITH D & C N/A 09/14/2019   Procedure: DILATATION AND CURETTAGE /HYSTEROSCOPY, POLYPECTOMY;  Surgeon: Natale Milch, MD;  Location: [facility];  Service: Gynecology;  Laterality: N/A;   LAPAROSCOPIC UNILATERAL SALPINGECTOMY Right 04/16/2021   Procedure: LAPAROSCOPIC UNILATERAL SALPINGECTOMY;  Surgeon: Nadara Mustard, MD;  Location: [facility];  Service: Gynecology;  Laterality: Right;   LAPAROSCOPY N/A 09/14/2019   Procedure: LAPAROSCOPY DIAGNOSTIC;  Surgeon: Natale Milch, MD;  Location: [facility];  Service: Gynecology;  Laterality: N/A;   TUBAL LIGATION     Tubal reversal  08/31/2017   WISDOM TOOTH EXTRACTION      Past Obstetrical History:  OB History  Gravida Para Term Preterm AB Living  8 3 3   3 3   SAB IAB Ectopic Multiple Live Births  3       3    # Outcome Date GA Lbr Len/2nd Weight Sex  Delivery Anes PTL Lv  8 Gravida           7 SAB 12/19/19 [redacted]w[redacted]d         6 Term 11/04/09 [redacted]w[redacted]d  7 lb 10 oz (3.459 kg) M Vag-Spont   LIV  5 Term 02/19/08 [redacted]w[redacted]d  7 lb 2 oz (3.232 kg) F Vag-Spont   LIV  4 SAB 10/21/06          3 Term 04/30/05 [redacted]w[redacted]d  6 lb 2 oz (2.778 kg) F Vag-Spont   LIV  2 SAB 09/20/02          1 Gravida             Past Gynecological History: As per  HPI. History of Pap Smear(s): Yes.   Last pap 2022, which was cytology and HPV negative  Social History:  Social History   Socioeconomic History   Marital status: Significant Other    Spouse name: Not on file   Number of children: Not on file   Years of education: Not on file   Highest education level: Not on file  Occupational History   Not on file  Tobacco Use   Smoking status: Former    Packs/day: 0.50    Years: 7.00    Additional pack years: 0.00    Total pack years: 3.50    Types: Cigarettes    Quit date: 09/10/2009    Years since quitting: 13.6   Smokeless tobacco: Never  Vaping Use   Vaping Use: Never used  Substance and Sexual Activity   Alcohol use: Not Currently    Comment: social   Drug use: No   Sexual activity: Yes    Partners: Male    Birth control/protection: None  Other Topics Concern   Not on file  Social History Narrative   Not on file   Social Determinants of Health   Financial Resource Strain: Not on file  Food Insecurity: Not on file  Transportation Needs: Not on file  Physical Activity: Not on file  Stress: Not on file  Social Connections: Not on file  Intimate Partner Violence: Not on file    Family History:  Family History  Problem Relation Age of Onset   Hypertension Mother    Diabetes Father    Hypertension Father     Medications Alyssa Pearson had no medications administered during this visit. Current Outpatient Medications  Medication Sig Dispense Refill   ascorbic acid (VITAMIN C) 500 MG tablet Take 500 mg by mouth daily.     iron polysaccharides (NU-IRON) 150 MG capsule Take 1 capsule (150 mg total) by mouth daily. 30 capsule 2   No current facility-administered medications for this visit.  multivitamin  Allergies Patient has no known allergies.   Physical Exam:  BP (!) 145/85   Pulse 83   Wt 212 lb 3.2 oz (96.3 kg)   LMP 04/25/2023 (Exact Date)   BMI 36.42 kg/m  Body mass index is 36.42 kg/m. General  appearance: Well nourished, well developed female in no acute distress.  Neck:  Supple, normal appearance, and no thyromegaly  Cardiovascular: normal s1 and s2.  No murmurs, rubs or gallops. Respiratory:  Clear to auscultation bilateral. Normal respiratory effort Abdomen: positive bowel sounds and no masses, hernias; diffusely non tender to palpation, non distended Breasts: breasts appear normal, no suspicious masses, no skin or nipple changes or axillary nodes, and normal palpation. Neuro/Psych:  Normal mood and affect.  Skin:  Warm and dry.  Lymphatic:  No inguinal lymphadenopathy.   Cervical exam performed in the presence of a chaperone Pelvic exam: is not limited by body habitus EGBUS: within normal limits Vagina: within normal limits and with no blood in vault. +white/yellow discharge in the vault with smell Cervix: normal appearing cervix without tenderness, discharge or lesions. Friable+ Uterus:  nonenlarged and non tender Adnexa:  normal adnexa and no mass, fullness, tenderness Rectovaginal: deferred  Laboratory: none  Radiology: none  Assessment: patient stable  Plan: 1. Well woman exam with routine gynecological exam - Cytology - PAP - Cervicovaginal ancillary only  2. Screen for STD (sexually transmitted disease) - Cytology - PAP - RPR+HBsAg+HCVAb+...  3. STD exposure - Cytology - PAP - Cervicovaginal ancillary only - RPR+HBsAg+HCVAb+...  4. Vaginal discharge  5. Fertility D/w her re: strategies and to let us know of any +UPT due to ectopic risk.   RTC PRN  No follow-ups on file.  No future appointments.  Cornelia Copa MD Attending Center for [employer] [facility])

## 2023-05-20 ENCOUNTER — Encounter: Payer: Self-pay | Admitting: Obstetrics and Gynecology

## 2023-05-20 LAB — RPR+HBSAG+HCVAB+...
HIV Screen 4th Generation wRfx: NONREACTIVE
Hep C Virus Ab: NONREACTIVE
Hepatitis B Surface Ag: NEGATIVE
RPR Ser Ql: NONREACTIVE

## 2023-05-20 LAB — CERVICOVAGINAL ANCILLARY ONLY
Bacterial Vaginitis (gardnerella): POSITIVE — AB
Candida Glabrata: NEGATIVE
Candida Vaginitis: POSITIVE — AB
Comment: NEGATIVE
Comment: NEGATIVE
Comment: NEGATIVE

## 2023-05-21 ENCOUNTER — Other Ambulatory Visit: Payer: Self-pay | Admitting: *Deleted

## 2023-05-21 LAB — CYTOLOGY - PAP
Chlamydia: NEGATIVE
Comment: NEGATIVE
Comment: NEGATIVE
Comment: NEGATIVE
Comment: NORMAL
Diagnosis: NEGATIVE
High risk HPV: NEGATIVE
Neisseria Gonorrhea: NEGATIVE
Trichomonas: NEGATIVE

## 2023-05-21 MED ORDER — FLUCONAZOLE 150 MG PO TABS: 150.0000 mg | ORAL_TABLET | Freq: Once | ORAL | 3 refills | Status: AC

## 2023-05-21 MED ORDER — METRONIDAZOLE 500 MG PO TABS: 500.0000 mg | ORAL_TABLET | Freq: Two times a day (BID) | ORAL | 0 refills | Status: AC

## 2023-05-24 MED ORDER — FLUCONAZOLE 150 MG PO TABS: 150.0000 mg | ORAL_TABLET | Freq: Once | ORAL | 0 refills | Status: AC

## 2023-05-24 NOTE — Addendum Note (Signed)
Addended by: Twilight Bing on: 05/24/2023 12:15 PM   Modules accepted: Orders

## 2023-06-22 ENCOUNTER — Ambulatory Visit (INDEPENDENT_AMBULATORY_CARE_PROVIDER_SITE_OTHER): Payer: 59 | Admitting: Obstetrics & Gynecology

## 2023-06-22 ENCOUNTER — Encounter: Payer: Self-pay | Admitting: Obstetrics & Gynecology

## 2023-06-22 VITALS — BP 134/87 | HR 80 | Wt 213.0 lb

## 2023-06-22 DIAGNOSIS — N979 Female infertility, unspecified: Secondary | ICD-10-CM | POA: Diagnosis not present

## 2023-06-22 NOTE — Progress Notes (Signed)
GYNECOLOGY OFFICE VISIT NOTE  History:   Alyssa Pearson is a 37 y.o. [phone] here today for HSG order. Has secondary infertility, history of tubal ligation and unilateral right salpingectomy followed by tubal reversal.  Wants to know status of her reversed left tube.  Has been a patient of [facility] recently.  Has regular periods, having timed intercourse around expected ovulation periods.  She denies any abnormal vaginal discharge, bleeding, pelvic pain or other concerns.    Past Medical History:  Diagnosis Date   Anemia    Anxiety    h/o    Arthritis    bil feet   Depression    h/o   Ectopic pregnancy 04/16/2021   GERD (gastroesophageal reflux disease)    h/o   Headache    migraines   Hypertension    Pre-diabetes    Vertigo     Past Surgical History:  Procedure Laterality Date   CHROMOPERTUBATION N/A 09/14/2019   Procedure: CHROMOPERTUBATION;  Surgeon: Natale Milch, MD;  Location: [facility];  Service: Gynecology;  Laterality: N/A;   DIAGNOSTIC LAPAROSCOPY WITH REMOVAL OF ECTOPIC PREGNANCY Right 04/16/2021   Procedure: DIAGNOSTIC LAPAROSCOPY WITH REMOVAL OF RUPTURED ECTOPIC PREGNANCY;  Surgeon: Nadara Mustard, MD;  Location: [facility];  Service: Gynecology;  Laterality: Right;   DILATATION & CURETTAGE/HYSTEROSCOPY WITH MYOSURE N/A 12/30/2022   Procedure: DILATATION & CURETTAGE/HYSTEROSCOPY WITH MYOSURE;  Surgeon: Tereso Newcomer, MD;  Location: [facility];  Service: Gynecology;  Laterality: N/A;   DILATION AND EVACUATION N/A 12/19/2019   Procedure: DILATATION AND EVACUATION;  Surgeon: Natale Milch, MD;  Location: [facility];  Service: Gynecology;  Laterality: N/A;   HYSTEROSCOPY WITH D & C N/A 09/14/2019   Procedure: DILATATION AND CURETTAGE /HYSTEROSCOPY, POLYPECTOMY;  Surgeon: Natale Milch, MD;  Location: [facility];  Service: Gynecology;  Laterality: N/A;   LAPAROSCOPIC UNILATERAL SALPINGECTOMY Right 04/16/2021   Procedure:  LAPAROSCOPIC UNILATERAL SALPINGECTOMY;  Surgeon: Nadara Mustard, MD;  Location: [facility];  Service: Gynecology;  Laterality: Right;   LAPAROSCOPY N/A 09/14/2019   Procedure: LAPAROSCOPY DIAGNOSTIC;  Surgeon: Natale Milch, MD;  Location: [facility];  Service: Gynecology;  Laterality: N/A;   TUBAL LIGATION     Tubal reversal  08/31/2017   WISDOM TOOTH EXTRACTION      The following portions of the patient's history were reviewed and updated as appropriate: allergies, current medications, past family history, past medical history, past social history, past surgical history and problem list.   Health Maintenance:  Normal pap and negative HRHPV on 05/19/2023.   Review of Systems:  Pertinent items noted in HPI and remainder of comprehensive ROS otherwise negative.  Physical Exam:  BP (!) 144/86   Pulse 89   Wt 213 lb (96.6 kg)   LMP 06/18/2023 (Exact Date)   BMI 36.56 kg/m  CONSTITUTIONAL: Well-developed, well-nourished female in no acute distress.  SKIN: No rash noted. Not diaphoretic. No erythema. No pallor. MUSCULOSKELETAL: Normal range of motion. No edema noted. NEUROLOGIC: Alert and oriented to person, place, and time. Normal muscle tone coordination. No cranial nerve deficit noted. PSYCHIATRIC: Normal mood and affect. Normal behavior. Normal judgment and thought content. CARDIOVASCULAR: Normal heart rate noted RESPIRATORY: Effort and breath sounds normal, no problems with respiration noted ABDOMEN: No masses noted. No other overt distention noted.   PELVIC: Deferred     Assessment and Plan:    1. Infertility, female, secondary HSG ordered, will follow up results and manage accordingly. Recommended to  reach back out to [facility] to see if they can do this test and also follow it up given her infertility issues.  Unfortunately, [facility] has a current backlog of HSGs waiting to be scheduled. - DG Hysterogram (HSG); Future  Routine preventative health maintenance  measures emphasized. Please refer to After Visit Summary for other counseling recommendations.   Return for follow up as recommended.    I spent 30 minutes dedicated to the care of this patient including pre-visit review of records, face to face time with the patient discussing her conditions and treatments and post visit orders.    Jaynie Collins, MD, FACOG Obstetrician & Gynecologist, [facility] for [employer], [facility]

## 2023-08-02 ENCOUNTER — Inpatient Hospital Stay: Admission: RE | Admit: 2023-08-02 | Payer: 59 | Source: Ambulatory Visit

## 2023-08-18 ENCOUNTER — Other Ambulatory Visit: Payer: Self-pay | Admitting: Obstetrics and Gynecology

## 2023-11-24 ENCOUNTER — Other Ambulatory Visit: Payer: Self-pay

## 2023-11-24 DIAGNOSIS — I10 Essential (primary) hypertension: Secondary | ICD-10-CM | POA: Insufficient documentation

## 2023-11-24 DIAGNOSIS — N939 Abnormal uterine and vaginal bleeding, unspecified: Secondary | ICD-10-CM | POA: Diagnosis present

## 2023-11-24 LAB — BASIC METABOLIC PANEL
Anion gap: 9 (ref 5–15)
BUN: 13 mg/dL (ref 6–20)
CO2: 23 mmol/L (ref 22–32)
Calcium: 8.7 mg/dL — ABNORMAL LOW (ref 8.9–10.3)
Chloride: 102 mmol/L (ref 98–111)
Creatinine, Ser: 0.74 mg/dL (ref 0.44–1.00)
GFR, Estimated: >60 mL/min (ref >60)
Glucose, Bld: 116 mg/dL — ABNORMAL HIGH (ref 70–99)
Potassium: 3.4 mmol/L — ABNORMAL LOW (ref 3.5–5.1)
Sodium: 134 mmol/L — ABNORMAL LOW (ref 135–145)

## 2023-11-24 LAB — URINALYSIS, ROUTINE W REFLEX MICROSCOPIC: RBC / HPF: >50 RBC/hpf (ref 0–5)

## 2023-11-24 LAB — CBC WITH DIFFERENTIAL/PLATELET
Abs Immature Granulocytes: 0.05 10*3/uL (ref 0.00–0.07)
Basophils Absolute: 0 10*3/uL (ref 0.0–0.1)
Basophils Relative: 0 %
Eosinophils Absolute: 0.1 10*3/uL (ref 0.0–0.5)
Eosinophils Relative: 1 %
HCT: 28 % — ABNORMAL LOW (ref 36.0–46.0)
Hemoglobin: 8.8 g/dL — ABNORMAL LOW (ref 12.0–15.0)
Immature Granulocytes: 0 %
Lymphocytes Relative: 28 %
Lymphs Abs: 3.2 10*3/uL (ref 0.7–4.0)
MCH: 24.8 pg — ABNORMAL LOW (ref 26.0–34.0)
MCHC: 31.4 g/dL (ref 30.0–36.0)
MCV: 78.9 fL — ABNORMAL LOW (ref 80.0–100.0)
Monocytes Absolute: 0.6 10*3/uL (ref 0.1–1.0)
Monocytes Relative: 5 %
Neutro Abs: 7.6 10*3/uL (ref 1.7–7.7)
Neutrophils Relative %: 66 %
Platelets: 467 10*3/uL — ABNORMAL HIGH (ref 150–400)
RBC: 3.55 MIL/uL — ABNORMAL LOW (ref 3.87–5.11)
RDW: 16.5 % — ABNORMAL HIGH (ref 11.5–15.5)
WBC: 11.5 10*3/uL — ABNORMAL HIGH (ref 4.0–10.5)
nRBC: 0 % (ref 0.0–0.2)

## 2023-11-24 LAB — POC URINE PREG, ED: Preg Test, Ur: NEGATIVE

## 2023-11-24 NOTE — ED Triage Notes (Signed)
Pt reports vaginal bleeding x19 days and RLQ pain. Pt denies n/v. Pt denies dysuria.

## 2023-11-25 ENCOUNTER — Emergency Department
Admission: EM | Admit: 2023-11-25 | Discharge: 2023-11-25 | Disposition: A | Discharge status: Home / Self Care | Payer: 59 | Attending: Emergency Medicine | Admitting: Emergency Medicine

## 2023-11-25 DIAGNOSIS — N939 Abnormal uterine and vaginal bleeding, unspecified: Secondary | ICD-10-CM

## 2023-11-25 DIAGNOSIS — I10 Essential (primary) hypertension: Secondary | ICD-10-CM

## 2023-11-25 MED ORDER — AMLODIPINE BESYLATE 5 MG PO TABS: 5.0000 mg | ORAL_TABLET | Freq: Every day | ORAL | 2 refills | Status: DC

## 2023-11-25 MED ORDER — NORGESTIMATE-ETH ESTRADIOL 0.25-35 MG-MCG PO TABS: 1.0000 | ORAL_TABLET | Freq: Every day | ORAL | 2 refills | Status: DC

## 2023-11-25 NOTE — ED Provider Notes (Signed)
[facility] Provider Note    Event Date/Time   First MD Initiated Contact with Patient 11/25/23 [phone]     (approximate)   History   Vaginal Bleeding   HPI LODIE ERBY is a 37 y.o. female who presents for persistent vaginal bleeding for close to 3 weeks as well as intermittent lower abdominal pain.  She said that when the bleeding started she thought she was having her regular period, but it has persisted since then.  The pain comes and goes but always feels like menstrual cramps.  It was little bit worse today so she wanted to get checked out because she has never had this issue of abnormal vaginal bleeding before.  She has been seen in the past by OB/GYN for infertility issues but currently takes no contraceptives and no hormones of any kind.  She has had no fever, nausea, vomiting, nor vomiting.      Physical Exam   Triage Vital Signs: ED Triage Vitals  Encounter Vitals Group     BP 11/24/23 2310 (!) 159/116     Systolic BP Percentile --      Diastolic BP Percentile --      Pulse Rate 11/24/23 2310 98     Resp 11/24/23 2310 18     Temp 11/24/23 2310 98.2 F (36.8 C)     Temp Source 11/24/23 2310 Oral     SpO2 11/24/23 2310 100 %     Weight 11/24/23 2309 95.3 kg (210 lb)     Height 11/24/23 2309 1.626 m (5\' 4" )     Head Circumference --      Peak Flow --      Pain Score 11/24/23 2308 8     Pain Loc --      Pain Education --      Exclude from Growth Chart --     Most recent vital signs: Vitals:   11/24/23 2310  BP: (!) 159/116  Pulse: 98  Resp: 18  Temp: 98.2 F (36.8 C)  SpO2: 100%    General: Awake, no distress.  CV:  Good peripheral perfusion.  Resp:  Normal effort. Speaking easily and comfortably, no accessory muscle usage nor intercostal retractions.   Abd:  No distention.  No tenderness to palpation throughout the abdomen with no rebound and no guarding. Other:  Deferred GU exam with joint decision making with  patient.   ED Results / Procedures / Treatments   Labs (all labs ordered are listed, but only abnormal results are displayed) Labs Reviewed  CBC WITH DIFFERENTIAL/PLATELET - Abnormal; Notable for the following components:      Result Value   WBC 11.5 (*)    RBC 3.55 (*)    Hemoglobin 8.8 (*)    HCT 28.0 (*)    MCV 78.9 (*)    MCH 24.8 (*)    RDW 16.5 (*)    Platelets 467 (*)    All other components within normal limits  BASIC METABOLIC PANEL - Abnormal; Notable for the following components:   Sodium 134 (*)    Potassium 3.4 (*)    Glucose, Bld 116 (*)    Calcium 8.7 (*)    All other components within normal limits  URINALYSIS, ROUTINE W REFLEX MICROSCOPIC - Abnormal; Notable for the following components:   Color, Urine RED (*)    APPearance TURBID (*)    Glucose, UA   (*)    Value: TEST NOT REPORTED DUE TO COLOR INTERFERENCE  OF URINE PIGMENT   Hgb urine dipstick   (*)    Value: TEST NOT REPORTED DUE TO COLOR INTERFERENCE OF URINE PIGMENT   Bilirubin Urine   (*)    Value: TEST NOT REPORTED DUE TO COLOR INTERFERENCE OF URINE PIGMENT   Ketones, ur   (*)    Value: TEST NOT REPORTED DUE TO COLOR INTERFERENCE OF URINE PIGMENT   Protein, ur   (*)    Value: TEST NOT REPORTED DUE TO COLOR INTERFERENCE OF URINE PIGMENT   Nitrite   (*)    Value: TEST NOT REPORTED DUE TO COLOR INTERFERENCE OF URINE PIGMENT   Leukocytes,Ua   (*)    Value: TEST NOT REPORTED DUE TO COLOR INTERFERENCE OF URINE PIGMENT   Bacteria, UA FEW (*)    All other components within normal limits  POC URINE PREG, ED       PROCEDURES:  Critical Care performed: No  Procedures    IMPRESSION / MDM / ASSESSMENT AND PLAN / ED COURSE  I reviewed the triage vital signs and the nursing notes.                              Differential diagnosis includes, but is not limited to, menometrorrhagia, ectopic pregnancy or other pregnancy related concern, uterine fibroids.  Other considerations include  intra-abdominal infection or ovarian cyst.  Patient's presentation is most consistent with acute presentation with potential threat to life or bodily function.  Labs/studies ordered: BMP, urine pregnancy test, urinalysis, CBC with differential  Interventions/Medications given:  Medications - No data to display  (Note:  hospital course my include additional interventions and/or labs/studies not listed above.)   Patient hemodynamically stable with minimal symptoms at this time.  No tenderness to palpation of the abdomen at all.  Symptoms most consistent with mental metrorrhagia (abnormal vaginal bleeding).  Little benefit to GU exam and patient agrees that she does not want or need a pelvic exam at this time.  I considered imaging but it is unlikely to identify a particular reason that would require emergent or even urgent intervention.  I talked about the typical treatment for menometrorrhagia including a trial of OCPs and GYN follow-up as an outpatient and the patient agreed this plan.  Of note, I also discussed with her her blood pressure which is quite elevated tonight.  She said that she previously was on antihypertensives but no longer takes them.  She has a new primary care doctor that she has not yet seen.  She would like to start on amlodipine until she can follow-up with her PCP which I think is reasonable so I provided her a 90-day supply of amlodipine along with Sprintec OCPs.  I gave my usual and customary follow-up recommendations and return precautions.       FINAL CLINICAL IMPRESSION(S) / ED DIAGNOSES   Final diagnoses:  Abnormal vaginal bleeding  Uncontrolled hypertension     Rx / DC Orders   ED Discharge Orders          Ordered    norgestimate-ethinyl estradiol (ORTHO-CYCLEN) 0.25-35 MG-MCG tablet  Daily        11/25/23 0312    amLODipine (NORVASC) 5 MG tablet  Daily        11/25/23 4098             Note:  This document was prepared using Dragon voice  recognition software and may include unintentional dictation errors.  Alyssa Rose, MD 11/25/23 [phone]

## 2023-11-25 NOTE — Discharge Instructions (Signed)
As we discussed, although you are having heavy vaginal bleeding, it is not dangerous at this time.  The plan at this time is for you to start taking the birth control pills prescribed as written and to follow-up with the GYN doctor indicated in this document.  Please return to the emergency department if you develop any new or worsening symptoms that concern you.  Remember that tobacco use greatly increases your risk of developing blood clots while taking birth control pills and avoid smoking or using any other tobacco products.  Please also follow-up with your primary care doctor to discuss your elevated blood pressure.  After talking with you about it, we wrote your prescription for amlodipine which you can start taking to help control your blood pressure.  Your regular provider may choose to leave you on this medication or try something different.  Regardless, please take it as prescribed until you have the opportunity to follow-up as an outpatient.    Return to the emergency department if you develop new or worsening symptoms that concern you.

## 2024-01-07 ENCOUNTER — Other Ambulatory Visit (HOSPITAL_COMMUNITY)
Admission: RE | Admit: 2024-01-07 | Discharge: 2024-01-07 | Disposition: A | Discharge status: Home / Self Care | Payer: 59 | Source: Ambulatory Visit | Attending: Family Medicine | Admitting: Family Medicine

## 2024-01-07 ENCOUNTER — Ambulatory Visit (INDEPENDENT_AMBULATORY_CARE_PROVIDER_SITE_OTHER): Payer: 59

## 2024-01-07 DIAGNOSIS — N898 Other specified noninflammatory disorders of vagina: Secondary | ICD-10-CM

## 2024-01-07 MED ORDER — FLUCONAZOLE 150 MG PO TABS: 150.0000 mg | ORAL_TABLET | Freq: Once | ORAL | 3 refills | Status: AC

## 2024-01-07 NOTE — Progress Notes (Signed)
Attestation of Attending Supervision of clinical support staff: I agree with the care provided to this patient and was available for any consultation.  I have reviewed the CMA's note and chart, and I agree with the management and plan.  Ambers Iyengar MD MPH, ABFM Attending Physician [facility]  

## 2024-01-07 NOTE — Progress Notes (Signed)
SUBJECTIVE:  38 y.o. female complains of  vaginal discharge and itching . Denies abnormal vaginal bleeding or significant pelvic pain or fever. No UTI symptoms. Denies history of known exposure to STD.  No LMP recorded.  OBJECTIVE:  She appears well, afebrile. Urine dipstick: not done.  ASSESSMENT:  Vaginal Discharge  Vaginal Itching   PLAN:  GC, chlamydia, trichomonas, BVAG, CVAG probe sent to lab. Treatment: Sent diflucan for itching for pt due to it being the weekend. Other treatment To be determined once lab results are received ROV prn if symptoms persist or worsen.

## 2024-01-10 ENCOUNTER — Encounter: Payer: Self-pay | Admitting: Family Medicine

## 2024-01-10 ENCOUNTER — Other Ambulatory Visit: Payer: Self-pay

## 2024-01-10 ENCOUNTER — Telehealth: Payer: Self-pay | Admitting: Family Medicine

## 2024-01-10 DIAGNOSIS — B9689 Other specified bacterial agents as the cause of diseases classified elsewhere: Secondary | ICD-10-CM

## 2024-01-10 LAB — CERVICOVAGINAL ANCILLARY ONLY
Bacterial Vaginitis (gardnerella): POSITIVE — AB
Candida Glabrata: NEGATIVE
Candida Vaginitis: NEGATIVE
Chlamydia: NEGATIVE
Comment: NEGATIVE
Comment: NEGATIVE
Comment: NEGATIVE
Comment: NEGATIVE
Comment: NEGATIVE
Comment: NORMAL
Neisseria Gonorrhea: NEGATIVE
Trichomonas: NEGATIVE

## 2024-01-10 MED ORDER — METRONIDAZOLE 500 MG PO TABS: 500.0000 mg | ORAL_TABLET | Freq: Two times a day (BID) | ORAL | 0 refills | Status: DC

## 2024-01-10 NOTE — Telephone Encounter (Signed)
Patient returning call asked that you call her back [phone]

## 2024-01-10 NOTE — Progress Notes (Deleted)
I got you.I was trying to call you to confirm it keeps going to voicemail.

## 2024-02-22 ENCOUNTER — Telehealth: Payer: Self-pay

## 2024-02-22 NOTE — Telephone Encounter (Signed)
 Left message for pt to call office back regarding mychart message sent about appointment.

## 2024-02-28 ENCOUNTER — Ambulatory Visit: Admitting: Obstetrics and Gynecology

## 2024-02-28 ENCOUNTER — Encounter: Payer: Self-pay | Admitting: Obstetrics and Gynecology

## 2024-02-28 VITALS — BP 159/91 | HR 98

## 2024-02-28 DIAGNOSIS — N6459 Other signs and symptoms in breast: Secondary | ICD-10-CM

## 2024-02-28 DIAGNOSIS — I10 Essential (primary) hypertension: Secondary | ICD-10-CM

## 2024-02-28 MED ORDER — MOMETASONE FUROATE 0.1 % EX OINT: TOPICAL_OINTMENT | Freq: Two times a day (BID) | CUTANEOUS | 0 refills | Status: AC

## 2024-02-28 NOTE — Progress Notes (Unsigned)
 Obstetrics and Gynecology New Patient Evaluation  Appointment Date: 02/28/2024  OBGYN Clinic: [facility]   Primary Care Provider: Katheran James  Chief Complaint: pruritic areolar spots on bilateral breasts  History of Present Illness: Alyssa Pearson is a 38 y.o. Black [id number] seen for the above chief complaint.  No prior s/s and they started about a month ago and only appear on the areola and can come and go. Sometimes itchy but not painful and skin with normal color. No nipple discharge, new soaps/clothes/detergents  Review of Systems: Pertinent items are noted in HPI.   Patient Active Problem List   Diagnosis Date Noted   Abnormal uterine bleeding due to endometrial polyp 12/30/2022   Urinary tract infection without hematuria 08/18/2022   Iron deficiency 07/04/2021   Obesity, Class II, BMI 35-39.9 07/04/2021   Cough 07/04/2021   Hypertension 08/05/2020   Pre-diabetes 07/10/2020   Finger pain, left 07/10/2020   Elevated troponin 04/30/2020   Depression 04/30/2020   History of reversal of tubal ligation 09/17/2017    Past Medical History:  Past Medical History:  Diagnosis Date   Anemia    Anxiety    h/o    Arthritis    bil feet   Depression    h/o   Ectopic pregnancy 04/16/2021   GERD (gastroesophageal reflux disease)    h/o   Headache    migraines   Hypertension    Pre-diabetes    Vertigo     Past Surgical History:  Past Surgical History:  Procedure Laterality Date   CHROMOPERTUBATION N/A 09/14/2019   Procedure: CHROMOPERTUBATION;  Surgeon: Natale Milch, MD;  Location: [facility];  Service: Gynecology;  Laterality: N/A;   DIAGNOSTIC LAPAROSCOPY WITH REMOVAL OF ECTOPIC PREGNANCY Right 04/16/2021   Procedure: DIAGNOSTIC LAPAROSCOPY WITH REMOVAL OF RUPTURED ECTOPIC PREGNANCY;  Surgeon: Nadara Mustard, MD;  Location: [facility];  Service: Gynecology;  Laterality: Right;   DILATATION & CURETTAGE/HYSTEROSCOPY WITH  MYOSURE N/A 12/30/2022   Procedure: DILATATION & CURETTAGE/HYSTEROSCOPY WITH MYOSURE;  Surgeon: Tereso Newcomer, MD;  Location: [facility];  Service: Gynecology;  Laterality: N/A;   DILATION AND EVACUATION N/A 12/19/2019   Procedure: DILATATION AND EVACUATION;  Surgeon: Natale Milch, MD;  Location: [facility];  Service: Gynecology;  Laterality: N/A;   HYSTEROSCOPY WITH D & C N/A 09/14/2019   Procedure: DILATATION AND CURETTAGE /HYSTEROSCOPY, POLYPECTOMY;  Surgeon: Natale Milch, MD;  Location: [facility];  Service: Gynecology;  Laterality: N/A;   LAPAROSCOPIC UNILATERAL SALPINGECTOMY Right 04/16/2021   Procedure: LAPAROSCOPIC UNILATERAL SALPINGECTOMY;  Surgeon: Nadara Mustard, MD;  Location: [facility];  Service: Gynecology;  Laterality: Right;   LAPAROSCOPY N/A 09/14/2019   Procedure: LAPAROSCOPY DIAGNOSTIC;  Surgeon: Natale Milch, MD;  Location: [facility];  Service: Gynecology;  Laterality: N/A;   TUBAL LIGATION     Tubal reversal  08/31/2017   WISDOM TOOTH EXTRACTION      Past Obstetrical History:  OB History  Gravida Para Term Preterm AB Living  8 3 3  3 3   SAB IAB Ectopic Multiple Live Births  3    3    # Outcome Date GA Lbr Len/2nd Weight Sex Type Anes PTL Lv  8 Gravida           7 SAB 12/19/19 [redacted]w[redacted]d         6 Term 11/04/09 101w0d  7 lb 10 oz (3.459 kg) M Vag-Spont   LIV  5 Term 02/19/08 [redacted]w[redacted]d  7 lb 2 oz (3.232 kg) F Vag-Spont   LIV  4 SAB 10/21/06          3 Term 04/30/05 [redacted]w[redacted]d  6 lb 2 oz (2.778 kg) F Vag-Spont   LIV  2 SAB 09/20/02          1 Gravida            Past Gynecological History: As per HPI.  Social History:  Social History   Socioeconomic History   Marital status: Significant Other    Spouse name: Not on file   Number of children: Not on file   Years of education: Not on file   Highest education level: Not on file  Occupational History   Not on file  Tobacco Use   Smoking status: Former    Current packs/day: 0.00     Average packs/day: 0.5 packs/day for 7.0 years (3.5 ttl pk-yrs)    Types: Cigarettes    Start date: 09/10/2002    Quit date: 09/10/2009    Years since quitting: 14.4   Smokeless tobacco: Never  Vaping Use   Vaping status: Never Used  Substance and Sexual Activity   Alcohol use: Not Currently    Comment: social   Drug use: No   Sexual activity: Yes    Partners: Male    Birth control/protection: None  Other Topics Concern   Not on file  Social History Narrative   Not on file   Social Drivers of Health   Financial Resource Strain: Not on file  Food Insecurity: Not on file  Transportation Needs: Not on file  Physical Activity: Not on file  Stress: Not on file  Social Connections: Not on file  Intimate Partner Violence: Not on file    Family History:  Family History  Problem Relation Age of Onset   Hypertension Mother    Diabetes Father    Hypertension Father    She *** any female cancers, bleeding or blood clotting disorders.   Health Maintenance:  Mammogram(s): {YES WU:981191} Date: *** Colonoscopy: {YES YN:829562} Date: *** Flu shot UTD:  {YES/NO/NOT APPLICABLE:20182}  Medications Avarose S. Baze had no medications administered during this visit. Current Outpatient Medications  Medication Sig Dispense Refill   ascorbic acid (VITAMIN C) 500 MG tablet Take 500 mg by mouth daily.     mometasone (ELOCON) 0.1 % ointment Apply topically in the morning and at bedtime for 14 days. To nipple affected areas 45 g 0   amLODipine (NORVASC) 5 MG tablet Take 1 tablet (5 mg total) by mouth daily. (Patient not taking: Reported on 02/28/2024) 30 tablet 2   norgestimate-ethinyl estradiol (ORTHO-CYCLEN) 0.25-35 MG-MCG tablet Take 1 tablet by mouth daily. (Patient not taking: Reported on 02/28/2024) 28 tablet 2   No current facility-administered medications for this visit.    Allergies Patient has no known allergies.   Physical Exam:  BP (!) 159/91   Pulse 98  There is no  height or weight on file to calculate BMI. Chaperoned by RN Inocencio Homes General appearance: Well nourished, well developed female in no acute distress.  Breasts: Breasts: {pe breast exam:315056}.    Respiratory:  Normal respiratory effort Neuro/Psych:  Normal mood and affect.  Skin:  Warm and dry.   Laboratory: none  Radiology: none  Assessment: patient stable  Plan:  1. Primary hypertension (Primary) Patient to make new PCP appointment as she's not happy with prior one.   2. Abnormal breast exam ?keratosis spots. Low suspicion for malignancy given characteristics.  RTC one month for f/u. Will try mometasone 0.1% ointment bid  to spots x 14 days and f/u in 1 month. Referral to Derm made.  - Ambulatory referral to Dermatology  Orders Placed This Encounter  Procedures   Ambulatory referral to Dermatology   Return in about 1 month (around 03/30/2024) for in person, with dr Vergie Living.  Cornelia Copa MD Attending [facility])

## 2024-02-28 NOTE — Progress Notes (Unsigned)
 Has some itchy spot on her breast that have been there for a few weeks, denies any drainage , new bras, or soap

## 2024-03-10 ENCOUNTER — Ambulatory Visit: Admitting: Obstetrics & Gynecology

## 2024-03-30 ENCOUNTER — Encounter: Payer: Self-pay | Admitting: Obstetrics & Gynecology

## 2024-04-03 ENCOUNTER — Other Ambulatory Visit: Payer: Self-pay

## 2024-04-03 DIAGNOSIS — N979 Female infertility, unspecified: Secondary | ICD-10-CM

## 2024-04-12 ENCOUNTER — Other Ambulatory Visit: Payer: Self-pay | Admitting: Obstetrics & Gynecology

## 2024-04-12 DIAGNOSIS — B9689 Other specified bacterial agents as the cause of diseases classified elsewhere: Secondary | ICD-10-CM

## 2024-04-21 ENCOUNTER — Ambulatory Visit: Admitting: Family Medicine

## 2024-04-21 ENCOUNTER — Encounter: Payer: Self-pay | Admitting: Family Medicine

## 2024-04-21 DIAGNOSIS — Z113 Encounter for screening for infections with a predominantly sexual mode of transmission: Secondary | ICD-10-CM

## 2024-04-21 DIAGNOSIS — R4586 Emotional lability: Secondary | ICD-10-CM | POA: Insufficient documentation

## 2024-04-21 LAB — HM HIV SCREENING LAB: HM HIV Screening: NEGATIVE

## 2024-04-21 LAB — WET PREP FOR TRICH, YEAST, CLUE
Clue Cell Exam: POSITIVE — AB
Trichomonas Exam: NEGATIVE
Yeast Exam: NEGATIVE

## 2024-04-21 MED ORDER — METRONIDAZOLE 500 MG PO TABS
500.0000 mg | ORAL_TABLET | Freq: Two times a day (BID) | ORAL | Status: DC
Start: 2024-04-21 — End: 2024-10-24
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
## 2024-04-21 NOTE — Patient Instructions (Signed)
 STI screening - Today we obtained a vaginal swab to screen for gonorrhea, chlamydia, and trichomonas and oral swab to screen for gonorrhea - We also obtained a blood sample to screen for HIV and syphilis - If the results are abnormal, I will give you a call.    Estimated time frame for results collected at the [facility] Department: Same day Trichomonas Yeast BV (bacterial vaginosis)  Within 1-2 weeks Gonorrhea Chlamydia  Within 2-3 weeks HIV Syphilis Hepatitis B Hepatitis C    Therapy You can use the following website to start looking for a therapist. Remember, finding a therapist you click with it a lot like speed dating - you have to sort through a bunch of lemons before finding what you like!  Nyle Belling, LCSW Licensed Clinical Social Worker at [facility] Department  Phone: [phone] [address], [place] [address]  [email or web address] Access to care line (24/7) [phone] Treatment information  Crisis assistance  Mobile crisis team connection Mental health needs  Substance use disorder  Intellectual and developmental disabilities  [facility] For children and teens who have experienced the death of a loved one Phone: [phone] [address], [place] [address] [email or web address]  [email or web address]  Search for [place], [place] (or city of your choice).  On the next page, you will see filter options at the top.  You may filter therapists by insurance they take, issues you would like to work on, or therapy types.   [email or web address]

## 2024-04-21 NOTE — Progress Notes (Signed)
 In house wet prep result reviewed by provider during visit.  The patient was dispensed Metronidazole   today. I provided counseling today regarding the medication. We discussed the medication, the side effects and when to call clinic. Patient given the opportunity to ask questions. Questions answered. Gery Kubas RN

## 2024-04-24 ENCOUNTER — Ambulatory Visit: Payer: Self-pay

## 2024-04-25 LAB — GONOCOCCUS CULTURE

## 2024-07-10 ENCOUNTER — Ambulatory Visit: Admitting: Physician Assistant

## 2024-07-10 DIAGNOSIS — Z91199 Patient's noncompliance with other medical treatment and regimen due to unspecified reason: Secondary | ICD-10-CM

## 2024-07-10 NOTE — Progress Notes (Signed)
 Patient no-showed today's appointment.

## 2024-08-15 ENCOUNTER — Ambulatory Visit

## 2024-08-25 ENCOUNTER — Ambulatory Visit

## 2024-10-05 ENCOUNTER — Other Ambulatory Visit: Payer: Self-pay | Admitting: Family Medicine

## 2024-10-05 ENCOUNTER — Ambulatory Visit

## 2024-10-05 VITALS — BP 147/77

## 2024-10-05 DIAGNOSIS — Z3202 Encounter for pregnancy test, result negative: Secondary | ICD-10-CM | POA: Diagnosis not present

## 2024-10-05 DIAGNOSIS — Z113 Encounter for screening for infections with a predominantly sexual mode of transmission: Secondary | ICD-10-CM

## 2024-10-05 DIAGNOSIS — N926 Irregular menstruation, unspecified: Secondary | ICD-10-CM

## 2024-10-05 DIAGNOSIS — Z01419 Encounter for gynecological examination (general) (routine) without abnormal findings: Secondary | ICD-10-CM | POA: Diagnosis not present

## 2024-10-05 DIAGNOSIS — Z30011 Encounter for initial prescription of contraceptive pills: Secondary | ICD-10-CM

## 2024-10-05 DIAGNOSIS — Z3009 Encounter for other general counseling and advice on contraception: Secondary | ICD-10-CM | POA: Diagnosis not present

## 2024-10-05 DIAGNOSIS — B3731 Acute candidiasis of vulva and vagina: Secondary | ICD-10-CM

## 2024-10-05 LAB — HM HIV SCREENING LAB: HM HIV Screening: NEGATIVE

## 2024-10-05 LAB — WET PREP FOR TRICH, YEAST, CLUE
Clue Cell Exam: NEGATIVE
Trichomonas Exam: NEGATIVE

## 2024-10-05 LAB — PREGNANCY, URINE: Preg Test, Ur: NEGATIVE

## 2024-10-05 MED ORDER — NORETHINDRONE 0.35 MG PO TABS
1.0000 | ORAL_TABLET | Freq: Every day | ORAL | 2 refills | Status: DC
Start: 2024-10-05 — End: 2024-10-23

## 2024-10-05 MED ORDER — NORGESTIMATE-ETH ESTRADIOL 0.25-35 MG-MCG PO TABS: 1.0000 | ORAL_TABLET | Freq: Every day | ORAL | 3 refills | Status: DC

## 2024-10-05 MED ORDER — FLUCONAZOLE 150 MG PO TABS: 150.0000 mg | ORAL_TABLET | Freq: Once | ORAL | Status: DC

## 2024-10-05 NOTE — Progress Notes (Signed)
 Pt is here for STD screening. Wet prep results reviewed with patient. Pt has been advised that prescription has been sent to the pharmacy for pick up. Condoms given. Kwadwo Gwynne Kemnitz,RN.
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
## 2024-10-05 NOTE — Progress Notes (Signed)
 NP Damien Satchel having trouble with provider Medicaid enrollment and meds bouncing back.   I have re-sent script for Micronor to pharmacy of choice. Agree with choice of Micronor given uncontrolled HTN.   Dorothyann Helling, MD 10/05/24  2:48 PM

## 2024-10-06 ENCOUNTER — Encounter: Payer: Self-pay | Admitting: Family Medicine

## 2024-10-06 MED ORDER — FLUCONAZOLE 150 MG PO TABS: 150.0000 mg | ORAL_TABLET | Freq: Once | ORAL | 0 refills | Status: AC

## 2024-10-06 NOTE — Addendum Note (Signed)
 Addended by: ROSABEL DAMIEN BRAVO on: 10/06/2024 09:18 AM   Modules accepted: Orders

## 2024-10-18 ENCOUNTER — Telehealth: Payer: Self-pay

## 2024-10-18 ENCOUNTER — Other Ambulatory Visit: Payer: Self-pay

## 2024-10-18 DIAGNOSIS — B3731 Acute candidiasis of vulva and vagina: Secondary | ICD-10-CM

## 2024-10-18 MED ORDER — FLUCONAZOLE 150 MG PO TABS: 150.0000 mg | ORAL_TABLET | Freq: Once | ORAL | 0 refills | Status: AC

## 2024-10-18 NOTE — Progress Notes (Signed)
 Patient called nurse line concerned that her vaginal symptoms have not fully resolved. She was treated for moderate yeast on her wet prep on 10/05/24. At that time, had missed her period and UPT was negative. She reports she got her period on 10/06/24 and it had not stopped. As of today, she is still spotting. Did re-start her Micronor. She reports she has had difficulty with irregular periods and lengthy bleeding in past as well. Discussed she may be adjusting to Micronor re-start. We did NOT do a full physical/family planning visit on 10/05/24 as she was scheduled for STI visit. She has private insurance - suggested patient call [facility] for a new patient appt where they can fully assess her bleeding/irregular menses and order labs/imaging as warranted.  Patient reports she will follow up at [facility] on Monday, and call [facility] for a new patient appt. She also requests a second dose of fluconazole  - sent to pharmacy.  1. Vulvovaginal candidiasis (Primary)  - fluconazole  (DIFLUCAN ) 150 MG tablet; Take 1 tablet (150 mg total) by mouth once for 1 dose.  Dispense: 1 tablet; Refill: 0   Damien Satchel [facility]

## 2024-10-18 NOTE — Telephone Encounter (Signed)
 LVM for pt to call back.

## 2024-10-23 ENCOUNTER — Encounter: Payer: Self-pay | Admitting: Family Medicine

## 2024-10-23 ENCOUNTER — Ambulatory Visit: Admitting: Family Medicine

## 2024-10-23 VITALS — BP 137/84 | HR 105 | Ht 64.0 in | Wt 220.0 lb

## 2024-10-23 DIAGNOSIS — E611 Iron deficiency: Secondary | ICD-10-CM

## 2024-10-23 DIAGNOSIS — N939 Abnormal uterine and vaginal bleeding, unspecified: Secondary | ICD-10-CM

## 2024-10-23 DIAGNOSIS — N898 Other specified noninflammatory disorders of vagina: Secondary | ICD-10-CM

## 2024-10-23 DIAGNOSIS — Z01419 Encounter for gynecological examination (general) (routine) without abnormal findings: Secondary | ICD-10-CM

## 2024-10-23 DIAGNOSIS — I10 Essential (primary) hypertension: Secondary | ICD-10-CM

## 2024-10-23 DIAGNOSIS — R7303 Prediabetes: Secondary | ICD-10-CM

## 2024-10-23 DIAGNOSIS — Z3009 Encounter for other general counseling and advice on contraception: Secondary | ICD-10-CM | POA: Diagnosis not present

## 2024-10-23 LAB — WET PREP FOR TRICH, YEAST, CLUE
Clue Cell Exam: NEGATIVE
Trichomonas Exam: NEGATIVE
Yeast Exam: NEGATIVE

## 2024-10-23 LAB — HEMOGLOBIN, FINGERSTICK: Hemoglobin: 8.7 g/dL — ABNORMAL LOW (ref 11.1–15.9)

## 2024-10-23 MED ORDER — NORETHINDRONE 0.35 MG PO TABS: 1.0000 | ORAL_TABLET | Freq: Every day | ORAL | 12 refills | Status: DC

## 2024-10-23 MED ORDER — FERROUS SULFATE 325 (65 FE) MG PO TBEC: 325.0000 mg | DELAYED_RELEASE_TABLET | ORAL | Status: AC

## 2024-10-23 NOTE — Progress Notes (Signed)
 Pt here for FP visit. FP education card given and reviewed with pt. The patient was dispensed #100 iron  tablets today. I provided counseling today regarding the medication. We discussed the medication, the side effects and when to call clinic. Patient given the opportunity to ask questions. Questions answered.  Larraine JONELLE Novak, RN

## 2024-10-23 NOTE — Progress Notes (Signed)
 [facility]PART[facility] [address], [address] [place] [address] Main phone: [phone]  Family Planning Visit - Repeat Yearly Visit  Subjective:  Alyssa Pearson is a 38 y.o. [id number]  being seen today for an annual wellness visit and to discuss contraception options. The patient is currently using oral contraceptive for pregnancy prevention. Patient is undecided about having a pregnancy in the next year.   Patient reports they are looking for a method with the following characteristics:  Minimal bleeding or improved bleeding profile  Patient has the following medical problems:  Patient Active Problem List   Diagnosis Date Noted   Mood changes 04/21/2024   Abnormal uterine bleeding due to endometrial polyp 12/30/2022   Urinary tract infection without hematuria 08/18/2022   Iron  deficiency 07/04/2021   Obesity, Class II, BMI 35-39.9 07/04/2021   Cough 07/04/2021   Hypertension 08/05/2020   Pre-diabetes 07/10/2020   Finger pain, left 07/10/2020   Elevated troponin 04/30/2020   Depression 04/30/2020   History of reversal of tubal ligation 09/17/2017   Chief Complaint  Patient presents with   Annual Exam    Vaginal bleeding     HPI Patient reports to clinic for annual exam. States she has been bleeding since 10/31 and is bothered by this. On micronor. Reports vaginal smell that started before bleeding. Had testing in office recently- all negative. Desires swabbing for BV again today- as last time she self swabbed and wonders if she did it wrong   Review of Systems  Constitutional:  Negative for weight loss.  Eyes:  Negative for blurred vision.  Respiratory:  Negative for cough and shortness of breath.   Cardiovascular:  Negative for claudication.  Gastrointestinal:  Negative for nausea.  Genitourinary:  Positive for dysuria. Negative for frequency.  Skin:  Negative for rash.  Neurological:  Positive for headaches.   Endo/Heme/Allergies:  Does not bruise/bleed easily.    See flowsheet for further details and programmatic requirements Hyperlink available at the top of the signed note in blue.  Flow sheet content below:  Pregnancy Intention Screening Does the patient want to become pregnant in the next year?: Ok Either Way Does the patient's partner want to become pregnant in the next year?: Ok Either Way Would the patient like to discuss contraceptive options today?: No Other:  Is it okay to contact you by mail?: Yes Difficulty accessing hygiene products (feminine products/soap) in the last 3 months: No Contraception History Past methods of contraception used by patient:: Hormonal Injection Adverse effects associated with Hormonal Injection: none Sexual History What age did you start your period?: 10 How often do you have your period?: every 26 days Date of last sex?: 10/03/24 Has the patient had unprotected sex within the last 5 days?: No Do you have sex with men, women, both men and women?: Men only In the past 2 months how many partners have you had sex with?: 1 In the past 12 months, how many partners have you had sex with?: 1 Is it possible that any of your sex partners in the past 12 months had sex with someone else whild they were still in a sexual relationship with you?: Yes What ways do you have sex?: Vaginal Do you or your partner use condoms and/or dental dams every time you have vaginal, oral or anal sex?: No Do you douche?: No Date of last HIV test?: 10/05/24 Have you ever had an STD?: Yes Have any of your partners had an  STD?: No Have you or your partner ever shot up drugs?: No Have any of your partners used drugs in the past?: No Have you or your partners exchanged money or drugs for sex?: No Risk Factors for Hep B Household, sexual, or needle sharing contact of a person infected with Hep B: No Sexual contact with a person who uses drugs not as prescribed?: No Currently or  Ever used drugs not as prescribed: No HIV Positive: No PRep Patient: No Men who have sex with men: N/A Have Hepatitis C: No History of Incarceration: No History of Homeslessness?: No Anal sex following anal drug use?: No Risk Factors for Hep C Currently using drugs not as prescribed: No Sexual partner(s) currently using drugs as not prescribed: No History of drug use: No HIV Positive: No People with a history of incarceration: No People born between the years of 17 and 48: No Counseling All Patients: Use specific methods of contraceoptive and identify adverse effects (R), Stop tobacco use, implementing the 5A counseling approach (R), Encourage mammagram for women 62 or older and younger than 50 if conditions support (R), Emergency Contraception Offered (R) if unprotected sex in past 5 days and/or propyhlactically as indicated., Provide emergency contraception counseling (R), Typical use rates for method effectiveness (R), Delay future pregnancy from 18 months to 5 years (R) at [facility] visit, Appropriate referral for additional services as needed (R) Education: Make informed decision about family planning, Provided preconception counseling, Reduce risk of transmission and protection from STD's and HIV, Understand BMI >25 or >18.5 is a health risk (weight management educational materials to be provided to client requests), Promoted daily consumption of MVI with folic acid if capable of conceiving., Review immunization history, inform client of recommended vaccines per CDC's ACIP Guidelines and refer to Immunization clinic, How to discontinue the method selected and information on back up method used, Results of physical assessment and labs (if performed), How to use the method selected and information on back up method used, How to use the method consistently and correctly, Teach back method completed, Warning signs for rare but serious adverse events and what to do if they experience a warning sign  (including emergency 24 hour number, where to seek emergency service outside of hours of operation), When to return for follow up (planned return schedule), PCP list given to patient Contraception Wrap Up Current Method: Oral Contraceptive End Method: Oral Contraceptive Contraception Counseling Provided: Yes How was the end contraceptive method provided?: Provided on site  Diabetes screening This patient is 38 y.o. with a BMI of Body mass index is 37.76 kg/m.Alyssa Pearson  Is patient eligible for diabetes screening (age >35 and BMI >25)?  yes  Was Hgb A1c ordered? yes  STI screening Patient reports 1 of partners in last year.  Does this patient desire STI screening?  Yes  Hepatitis C screening Has patient been screened once for HCV in the past?  No  No results found for: HCVAB  Does the patient meet criteria for HCV testing? No  (If yes-- Screen for HCV through [facility]) Criteria:  Since the last HCV result, does the patient have any of the following? - Current drug use - Have a partner with drug use - Has been incarcerated  Hepatitis B screening Does the patient meet criteria for HBV testing? No Criteria:  -Household, sexual or needle sharing contact with HBV -History of drug use -HIV positive -Those with known Hep C  Cervical Cancer Screening  Result Date Procedure Results Follow-ups  05/19/2023 Cytology - PAP High risk HPV: Negative Neisseria Gonorrhea: Negative Chlamydia: Negative Trichomonas: Negative Adequacy: Satisfactory for evaluation; transformation zone component PRESENT. Diagnosis: - Negative for intraepithelial lesion or malignancy (NILM) Microorganisms: Fungal organisms present consistent with Candida spp. Microorganisms: Shift in flora suggestive of bacterial vaginosis Comment: Normal Reference Ranger Chlamydia - Negative Comment: Normal Reference Range Neisseria Gonorrhea - Negative Comment: Normal Reference Range HPV - Negative Comment: Normal Reference  Range Trichomonas - Negative   12/30/2022 Surgical pathology SURGICAL PATHOLOGY: SURGICAL PATHOLOGY CASE: [id number] PATIENT: Alyssa Pearson Surgical Pathology Report     Clinical History: AUB polyp (crm)     FINAL MICROSCOPIC DIAGNOSIS:  A. ENDOMETRIUM AND POLYP, CURETTAGE: Benign endometrial polyp Benign proli...   04/03/2021 Cytology - PAP High risk HPV: Negative Neisseria Gonorrhea: Negative Chlamydia: Negative Trichomonas: Negative Adequacy: Satisfactory for evaluation; transformation zone component PRESENT. Diagnosis: - Negative for intraepithelial lesion or malignancy (NILM) Microorganisms: Fungal organisms present consistent with Candida spp. Comment: Normal Reference Range HPV - Negative Comment: Normal Reference Range Trichomonas - Negative Comment: Normal Reference Ranger Chlamydia - Negative Comment: Normal Reference Range Neisseria Gonorrhea - Negative   06/16/2018 Cytology - PAP Adequacy: Satisfactory for evaluation  endocervical/transformation zone component PRESENT. Diagnosis: NEGATIVE FOR INTRAEPITHELIAL LESIONS OR MALIGNANCY. Chlamydia: Negative Neisseria Gonorrhea: Negative HPV: NOT DETECTED Material Submitted: CervicoVaginal Pap [ThinPrep Imaged] CYTOLOGY - PAP: PAP RESULT     Health Maintenance Due  Topic Date Due   DTaP/Tdap/Td (1 - Tdap) Never done   Hepatitis B Vaccines 19-59 Average Risk (1 of 3 - 19+ 3-dose series) Never done   HPV VACCINES (1 - 3-dose SCDM series) Never done   Mammogram  Never done   Influenza Vaccine  Never done   COVID-19 Vaccine (1 - 2025-26 season) Never done    The following portions of the patient's history were reviewed and updated as appropriate: allergies, current medications, past family history, past medical history, past social history, past surgical history and problem list. Problem list updated.  Objective:   Vitals:   10/23/24 1015  BP: 137/84  Pulse: (!) 105  Weight: 220 lb (99.8 kg)  Height: 5' 4  (1.626 m)    Physical Exam Vitals and nursing note reviewed. Exam conducted with a chaperone present Brett Orange CNA).  Constitutional:      Appearance: She is obese.  HENT:     Head: Normocephalic and atraumatic.     Mouth/Throat:     Mouth: Mucous membranes are moist.     Pharynx: Oropharynx is clear. No oropharyngeal exudate or posterior oropharyngeal erythema.  Pulmonary:     Effort: Pulmonary effort is normal.  Abdominal:     General: Abdomen is flat.     Palpations: There is no mass.     Tenderness: There is no abdominal tenderness. There is no rebound.  Genitourinary:    General: Normal vulva.     Exam position: Lithotomy position.     Pubic Area: No rash or pubic lice.      Labia:        Right: No rash or lesion.        Left: No rash or lesion.      Vagina: Normal. No vaginal discharge, erythema, bleeding or lesions.     Cervix: No cervical motion tenderness, discharge, friability, lesion or erythema.     Comments: Vaginal bleeding with small clots present Lymphadenopathy:     Head:     Right side of head: No preauricular or posterior auricular adenopathy.  Left side of head: No preauricular or posterior auricular adenopathy.     Cervical: No cervical adenopathy.     Upper Body:     Right upper body: No supraclavicular, axillary or epitrochlear adenopathy.     Left upper body: No supraclavicular, axillary or epitrochlear adenopathy.     Lower Body: No right inguinal adenopathy. No left inguinal adenopathy.  Skin:    General: Skin is warm and dry.     Findings: No rash.  Neurological:     Mental Status: She is alert and oriented to person, place, and time.     Assessment and Plan:  Alyssa Pearson is a 38 y.o. female [phone] presenting to the [facility] Department for an yearly wellness and contraception visit    1. Family planning (Primary) Contraception counseling:  Reviewed options based on patient desire and reproductive life plan.  Patient is interested in Oral Contraceptive. This was provided to the patient today.   Risks, benefits, and typical effectiveness rates were reviewed.  Questions were answered.  Written information was also given to the patient to review.    The patient will follow up in  1 years for surveillance.  The patient was told to call with any further questions, or with any concerns about this method of contraception.  Emphasized use of condoms 100% of the time for STI prevention.  Emergency Contraception Precautions (ECP): Patient assessed for need of ECP. She is not a candidate based on OCPs used correctly without missed doses.  Educated on ECP and reviewed options.  Patient desires no method - patient politely declines any emergency contraception.   - norethindrone (MICRONOR) 0.35 MG tablet; Take 1 tablet (0.35 mg total) by mouth daily.  Dispense: 28 tablet; Refill: 12  2. Well woman exam with routine gynecological exam -CBE declined today- reviewed recommendation for mammogram, pt has insurance. Encouraged to seek PCP for referral -endorses some burning inside after voiding- reviewed this can possibly be a UTI- however unable to do testing here. Encouraged visit to PCP  3. Abnormal uterine bleeding due to endometrial polyp -hx of polyps, had surgical removal x 2 in the last few years -reports bleeding started 10/31, initial week of bleeding heavy, and now has been spotting with intermittent clots -reports she is bothered by this bleeding -reviewed possible causes of abnormal bleeding- including SE of Micronor, fluctuations in hormones, as well as possibly another polyp -given option today to change BCM- declined, would like workup for bleeding to see if there is another polyp -has established care with [facility]- encouraged to make another appointment   4. Vaginal irritation -reports smell that has been going on since prior to vaginal bleeding -self swabbed last time and was  negative for BV but desires to be swabbed by provider today  - WET PREP FOR TRICH, YEAST, CLUE  5. Pre-diabetes -reports she has been making diet changes, agrees to A1c today  - Hgb A1c w/o eAG  6. Iron  deficiency -reports known anemia- with increased vaginal bleeding has been worried about her Hgb -endorses fatigue -has not been taking iron  supplements, reviewed iron  rich foods  - Hemoglobin, fingerstick  7. Primary hypertension -known HTN, not currently seeing PCP -believes HA are related to HTN- encouraged to see PCP    Return in about 1 year (around 10/23/2025) for annual well-woman exam.  No future appointments.  Verneta Bers, [place]

## 2024-10-24 ENCOUNTER — Encounter: Payer: Self-pay | Admitting: Obstetrics & Gynecology

## 2024-10-24 ENCOUNTER — Other Ambulatory Visit (HOSPITAL_COMMUNITY)
Admission: RE | Admit: 2024-10-24 | Discharge: 2024-10-24 | Disposition: A | Discharge status: Home / Self Care | Source: Ambulatory Visit | Attending: Obstetrics & Gynecology | Admitting: Obstetrics & Gynecology

## 2024-10-24 ENCOUNTER — Ambulatory Visit: Admitting: Obstetrics & Gynecology

## 2024-10-24 VITALS — BP 146/88 | HR 90 | Wt 223.0 lb

## 2024-10-24 DIAGNOSIS — Z1231 Encounter for screening mammogram for malignant neoplasm of breast: Secondary | ICD-10-CM

## 2024-10-24 DIAGNOSIS — N939 Abnormal uterine and vaginal bleeding, unspecified: Secondary | ICD-10-CM | POA: Insufficient documentation

## 2024-10-24 LAB — HGB A1C W/O EAG: Hgb A1c MFr Bld: 6 % — ABNORMAL HIGH (ref 4.8–5.6)

## 2024-10-24 MED ORDER — MEGESTROL ACETATE 40 MG PO TABS: 80.0000 mg | ORAL_TABLET | Freq: Every day | ORAL | 5 refills | Status: DC

## 2024-10-24 NOTE — Progress Notes (Signed)
 GYNECOLOGY OFFICE VISIT NOTE  History:  Alyssa Pearson is a 38 y.o. [phone] here today for evaluation of AUB.  Reports starting on Micronor for contraception and heavy periods; progestin only pill used due to her HTN.  This resulted in having bleeding with small clots daily since 10/06/24. She stopped this a few days ago, stl bleeding.  Uses 2 -3 sanitary products daily. No lightheadedness, dizziness.  She is sexually active. She denies any pelvic pain or other concerns.  Past Medical History:  Diagnosis Date   Anemia    Anxiety    h/o    Arthritis    bil feet   Depression    h/o   Ectopic pregnancy 04/16/2021   GERD (gastroesophageal reflux disease)    h/o   Headache    migraines   Hypertension    Pre-diabetes    Vertigo     Past Surgical History:  Procedure Laterality Date   CHROMOPERTUBATION N/A 09/14/2019   Procedure: CHROMOPERTUBATION;  Surgeon: Victor Claudell SAUNDERS, MD;  Location: [facility];  Service: Gynecology;  Laterality: N/A;   DIAGNOSTIC LAPAROSCOPY WITH REMOVAL OF ECTOPIC PREGNANCY Right 04/16/2021   Procedure: DIAGNOSTIC LAPAROSCOPY WITH REMOVAL OF RUPTURED ECTOPIC PREGNANCY;  Surgeon: Arloa Lamar SQUIBB, MD;  Location: [facility];  Service: Gynecology;  Laterality: Right;   DILATATION & CURETTAGE/HYSTEROSCOPY WITH MYOSURE N/A 12/30/2022   Procedure: DILATATION & CURETTAGE/HYSTEROSCOPY WITH MYOSURE;  Surgeon: Herchel Gloris LABOR, MD;  Location: [facility];  Service: Gynecology;  Laterality: N/A;   DILATION AND EVACUATION N/A 12/19/2019   Procedure: DILATATION AND EVACUATION;  Surgeon: Victor Claudell SAUNDERS, MD;  Location: [facility];  Service: Gynecology;  Laterality: N/A;   HYSTEROSCOPY WITH D & C N/A 09/14/2019   Procedure: DILATATION AND CURETTAGE /HYSTEROSCOPY, POLYPECTOMY;  Surgeon: Victor Claudell SAUNDERS, MD;  Location: [facility];  Service: Gynecology;  Laterality: N/A;   LAPAROSCOPIC UNILATERAL SALPINGECTOMY Right 04/16/2021   Procedure: LAPAROSCOPIC  UNILATERAL SALPINGECTOMY;  Surgeon: Arloa Lamar SQUIBB, MD;  Location: [facility];  Service: Gynecology;  Laterality: Right;   LAPAROSCOPY N/A 09/14/2019   Procedure: LAPAROSCOPY DIAGNOSTIC;  Surgeon: Victor Claudell SAUNDERS, MD;  Location: [facility];  Service: Gynecology;  Laterality: N/A;   TUBAL LIGATION     Tubal reversal  08/31/2017   WISDOM TOOTH EXTRACTION      The following portions of the patient's history were reviewed and updated as appropriate: allergies, current medications, past family history, past medical history, past social history, past surgical history and problem list.   Health Maintenance:  Normal pap and negative HRHPV on 05/19/2023.  No mammogram yet.   Review of Systems:  Pertinent items noted in HPI and remainder of comprehensive ROS otherwise negative.  Physical Exam: Chaperone Neva Hyler, CMA  BP (!) 146/88   Pulse 90   Wt 223 lb (101.2 kg)   LMP 10/06/2024 (Exact Date)   BMI 38.28 kg/m  CONSTITUTIONAL: Well-developed, well-nourished female in no acute distress.  HEENT:  Normocephalic, atraumatic. External right and left ear normal. No scleral icterus.  NECK: Normal range of motion, supple, no masses noted on observation SKIN: No rash noted. Not diaphoretic. No erythema. No pallor. MUSCULOSKELETAL: Normal range of motion. No edema noted. NEUROLOGIC: Alert and oriented to person, place, and time. Normal muscle tone coordination. No cranial nerve deficit noted on observation. PSYCHIATRIC: Normal mood and affect. Normal behavior. Normal judgment and thought content. CARDIOVASCULAR: Normal heart rate noted RESPIRATORY: Effort and breath sounds normal, no problems with respiration noted ABDOMEN:  No masses or other overt distention noted on observation. No tenderness.   PELVIC: Normal appearing external genitalia; normal urethral meatus; normal appearing vaginal mucosa and cervix.  Bloody discharge noted with slow trickle of bleeding from cervix.  Performed in the  presence of a chaperone   Assessment and Plan:     1. Breast cancer screening by mammogram Mammogram to be scheduled for patient. - MM 3D SCREENING MAMMOGRAM BILATERAL BREAST; Future  2. Abnormal uterine bleeding (AUB) (Primary) Patient has abnormal uterine bleeding. She has a normal exam, no evidence of lesions.  Will order abnormal uterine bleeding evaluation labs and pelvic ultrasound to evaluate for any structural gynecologic abnormalities.  Will contact patient with these results and plans for further evaluation/management.  In the meantime, Megace ordered for management, bleeding precautions reviewed.  - Cervicovaginal ancillary only - CBC - Beta hCG quant (ref lab) - TSH Rfx on Abnormal to Free T4 - US  PELVIC COMPLETE WITH TRANSVAGINAL; Future - megestrol (MEGACE) 40 MG tablet; Take 2 tablets (80 mg total) by mouth daily. Can increase to two tablets twice a day if bleeding not controlled  Dispense: 60 tablet; Refill: 5  Routine preventative health maintenance measures emphasized. Please refer to After Visit Summary for other counseling recommendations.   Return for follow up as recommended.    I spent 45 minutes dedicated to the care of this patient including pre-visit review of records, face to face time with the patient discussing her conditions and treatments, post visit ordering of medications and appropriate tests or procedures, coordinating care and documenting this visit encounter.    GLORIS HUGGER, MD, FACOG Obstetrician & Gynecologist, [facility] for [employer], [facility]

## 2024-10-25 ENCOUNTER — Encounter: Payer: Self-pay | Admitting: Obstetrics & Gynecology

## 2024-10-25 ENCOUNTER — Ambulatory Visit
Admission: RE | Admit: 2024-10-25 | Discharge: 2024-10-25 | Disposition: A | Discharge status: Home / Self Care | Source: Ambulatory Visit | Attending: Obstetrics & Gynecology | Admitting: Obstetrics & Gynecology

## 2024-10-25 ENCOUNTER — Ambulatory Visit: Payer: Self-pay | Admitting: Obstetrics & Gynecology

## 2024-10-25 DIAGNOSIS — D500 Iron deficiency anemia secondary to blood loss (chronic): Secondary | ICD-10-CM | POA: Insufficient documentation

## 2024-10-25 DIAGNOSIS — N939 Abnormal uterine and vaginal bleeding, unspecified: Secondary | ICD-10-CM | POA: Insufficient documentation

## 2024-10-25 DIAGNOSIS — B9689 Other specified bacterial agents as the cause of diseases classified elsewhere: Secondary | ICD-10-CM

## 2024-10-25 LAB — CERVICOVAGINAL ANCILLARY ONLY
Bacterial Vaginitis (gardnerella): POSITIVE — AB
Candida Glabrata: NEGATIVE
Candida Vaginitis: NEGATIVE
Chlamydia: NEGATIVE
Comment: NEGATIVE
Comment: NEGATIVE
Comment: NEGATIVE
Comment: NEGATIVE
Comment: NEGATIVE
Comment: NORMAL
Neisseria Gonorrhea: NEGATIVE
Trichomonas: NEGATIVE

## 2024-10-25 LAB — CBC
Hematocrit: 29.4 % — ABNORMAL LOW (ref 34.0–46.6)
Hemoglobin: 8.5 g/dL — ABNORMAL LOW (ref 11.1–15.9)
MCH: 21.9 pg — ABNORMAL LOW (ref 26.6–33.0)
MCHC: 28.9 g/dL — ABNORMAL LOW (ref 31.5–35.7)
MCV: 76 fL — ABNORMAL LOW (ref 79–97)
Platelets: 533 x10E3/uL — ABNORMAL HIGH (ref 150–450)
RBC: 3.88 x10E6/uL (ref 3.77–5.28)
RDW: 18.4 % — ABNORMAL HIGH (ref 11.7–15.4)
WBC: 8.6 x10E3/uL (ref 3.4–10.8)

## 2024-10-25 LAB — TSH RFX ON ABNORMAL TO FREE T4: TSH: 2.42 u[IU]/mL (ref 0.450–4.500)

## 2024-10-25 LAB — BETA HCG QUANT (REF LAB): hCG Quant: <1 m[IU]/mL

## 2024-10-25 MED ORDER — METRONIDAZOLE 500 MG PO TABS: 500.0000 mg | ORAL_TABLET | Freq: Two times a day (BID) | ORAL | 0 refills | Status: AC

## 2024-10-26 ENCOUNTER — Telehealth: Payer: Self-pay

## 2024-10-26 ENCOUNTER — Other Ambulatory Visit (HOSPITAL_COMMUNITY): Payer: Self-pay | Admitting: Obstetrics & Gynecology

## 2024-10-26 NOTE — Telephone Encounter (Signed)
 Dr. Herchel, patient will be scheduled as soon as possible.  Auth Submission: NO AUTH NEEDED Site of care: Site of care: [facility] Payer: [employer] commercial Medication & CPT/J Code(s) submitted: Venofer (Iron  Sucrose) J1756 Diagnosis Code:  Route of submission (phone, fax, portal):  Phone # Fax # Auth type: Buy/Bill PB Units/visits requested: 300mg  x 3 doses Reference number:  Approval from: 10/26/24 to 12/06/24

## 2024-10-27 ENCOUNTER — Ambulatory Visit: Payer: Self-pay | Admitting: Family Medicine

## 2024-10-30 ENCOUNTER — Ambulatory Visit
Admission: RE | Admit: 2024-10-30 | Discharge: 2024-10-30 | Disposition: A | Discharge status: Home / Self Care | Source: Ambulatory Visit | Attending: Obstetrics & Gynecology | Admitting: Obstetrics & Gynecology

## 2024-10-30 DIAGNOSIS — Z1231 Encounter for screening mammogram for malignant neoplasm of breast: Secondary | ICD-10-CM | POA: Diagnosis present

## 2024-11-08 ENCOUNTER — Ambulatory Visit (INDEPENDENT_AMBULATORY_CARE_PROVIDER_SITE_OTHER)

## 2024-11-08 VITALS — BP 118/73 | HR 79 | Temp 98.2°F | Resp 18 | Ht 64.75 in | Wt 219.0 lb

## 2024-11-08 DIAGNOSIS — D500 Iron deficiency anemia secondary to blood loss (chronic): Secondary | ICD-10-CM

## 2024-11-08 DIAGNOSIS — E611 Iron deficiency: Secondary | ICD-10-CM

## 2024-11-08 MED ORDER — IRON SUCROSE 300 MG IVPB - SIMPLE MED
300.0000 mg | Freq: Once | Status: AC
  Administered 2024-11-08: 300 mg via INTRAVENOUS
  Filled 2024-11-08: qty 265

## 2024-11-08 NOTE — Progress Notes (Signed)
 Diagnosis:  Acute Anemia  Provider:  Lonna Coder MD  Procedure: IV Infusion  IV Type: Peripheral, IV Location: R Antecubital  Venofer (Iron  Sucrose), Dose: 300 mg  Infusion Start Time: 1113  Infusion Stop Time: 1244  Post Infusion IV Care: Observation period completed and Peripheral IV Discontinued  Discharge: Condition: Good, Destination: Home . AVS Declined  Performed by:  Donny Childes, RN

## 2024-11-10 ENCOUNTER — Encounter: Payer: Self-pay | Admitting: Obstetrics & Gynecology

## 2024-11-11 ENCOUNTER — Encounter: Payer: Self-pay | Admitting: Obstetrics & Gynecology

## 2024-11-11 MED ORDER — NORETHINDRONE ACETATE 5 MG PO TABS: 10.0000 mg | ORAL_TABLET | Freq: Every day | ORAL | 2 refills | Status: DC

## 2024-11-15 ENCOUNTER — Ambulatory Visit

## 2024-11-15 VITALS — BP 138/85 | HR 76 | Temp 99.6°F | Resp 14 | Ht 64.0 in | Wt 218.8 lb

## 2024-11-15 DIAGNOSIS — D500 Iron deficiency anemia secondary to blood loss (chronic): Secondary | ICD-10-CM | POA: Diagnosis not present

## 2024-11-15 DIAGNOSIS — E611 Iron deficiency: Secondary | ICD-10-CM

## 2024-11-15 MED ORDER — IRON SUCROSE 300 MG IVPB - SIMPLE MED
300.0000 mg | Freq: Once | Status: AC
  Administered 2024-11-15: 300 mg via INTRAVENOUS
  Filled 2024-11-15: qty 265

## 2024-11-15 NOTE — Progress Notes (Signed)
 Diagnosis: Acute Anemia  Provider:  Lonna Coder MD  Procedure: IV Infusion  IV Type: Peripheral, IV Location: L Forearm  Venofer  (Iron  Sucrose), Dose: 300 mg  Infusion Start Time: 0915  Infusion Stop Time: 1055  Post Infusion IV Care: Observation period completed and Peripheral IV Discontinued  Discharge: Condition: Good, Destination: Home . AVS Declined  Performed by:  Donny Childes, RN

## 2024-11-22 ENCOUNTER — Ambulatory Visit

## 2024-11-22 VITALS — BP 107/69 | HR 77 | Temp 99.2°F | Resp 16 | Ht 64.0 in | Wt 217.6 lb

## 2024-11-22 DIAGNOSIS — D500 Iron deficiency anemia secondary to blood loss (chronic): Secondary | ICD-10-CM | POA: Diagnosis not present

## 2024-11-22 DIAGNOSIS — E611 Iron deficiency: Secondary | ICD-10-CM

## 2024-11-22 MED ORDER — IRON SUCROSE 300 MG IVPB - SIMPLE MED
300.0000 mg | Freq: Once | Status: AC
  Administered 2024-11-22: 10:00:00 300 mg via INTRAVENOUS
  Filled 2024-11-22: qty 265

## 2024-11-22 NOTE — Progress Notes (Signed)
 Diagnosis: , Acute Anemia  Provider:  Lonna Coder MD  Procedure: IV Infusion  IV Type: Peripheral, IV Location: L Antecubital  , Venofer  (Iron  Sucrose), Dose: 300 mg  Infusion Start Time: 1024  Infusion Stop Time: 1204  Post Infusion IV Care: Patient declined observation and Peripheral IV Discontinued  Discharge: Condition: Good, Destination: Home . AVS Declined  Performed by:  Donny Childes, RN

## 2024-12-21 ENCOUNTER — Encounter: Payer: Self-pay | Admitting: Obstetrics & Gynecology

## 2024-12-27 ENCOUNTER — Ambulatory Visit: Payer: Self-pay | Admitting: Certified Nurse Midwife

## 2024-12-28 ENCOUNTER — Other Ambulatory Visit: Payer: Self-pay | Admitting: Obstetrics & Gynecology

## 2024-12-28 ENCOUNTER — Ambulatory Visit: Admitting: Obstetrics and Gynecology

## 2024-12-28 VITALS — BP 146/93 | HR 90 | Wt 229.0 lb

## 2024-12-28 DIAGNOSIS — N939 Abnormal uterine and vaginal bleeding, unspecified: Secondary | ICD-10-CM | POA: Diagnosis not present

## 2024-12-28 DIAGNOSIS — I10 Essential (primary) hypertension: Secondary | ICD-10-CM

## 2024-12-28 DIAGNOSIS — Z862 Personal history of diseases of the blood and blood-forming organs and certain disorders involving the immune mechanism: Secondary | ICD-10-CM

## 2024-12-28 DIAGNOSIS — R7303 Prediabetes: Secondary | ICD-10-CM | POA: Diagnosis not present

## 2024-12-28 DIAGNOSIS — N840 Polyp of corpus uteri: Secondary | ICD-10-CM

## 2024-12-28 HISTORY — DX: Polyp of corpus uteri: N84.0

## 2024-12-28 MED ORDER — NORETHINDRONE ACETATE 5 MG PO TABS: 15.0000 mg | ORAL_TABLET | Freq: Every day | ORAL | 2 refills | Status: DC

## 2024-12-28 NOTE — Progress Notes (Signed)
 Patient desires refill of Aygestin  15 mg daily, this was done as requested.  Surgical management is pending being scheduled.  Gloris Hugger, MD

## 2024-12-28 NOTE — Progress Notes (Signed)
 Obstetrics and Gynecology Visit Return Patient Evaluation  Appointment Date: 12/28/2024  Primary Care Provider: Harrie Bruckner  OBGYN Clinic: [facility] for [facility]  Chief Complaint: Add on visit for AUB  History of Present Illness:  Alyssa Pearson is a 39 y.o. G77P3 with above chief complaint. History significant for HTN, h/o endometrial polyps, one tube in place after an ectopic pregnancy with a prior h/o tubal reversal, h/o anemia  Patient followed by Dr. Herchel and patient states not currently bleeding but she continues to have episodes of spotting and then passing clots. Patient ran out of Aygestin  three days ago but she said she was doing 15mg  qday; she has previously used megace , micronor  and orthocyclen. She had a 2024 hysteroscopy and myosure polypectomy (benign path) and she said that her AUB resolved afterwards.   Patient last seen by Dr. Herchel 10/24/24 and plan at that time was to check labs, ultrasound and follow up after that  Review of Systems: as noted in the History of Present Illness.  Patient Active Problem List   Diagnosis Date Noted   Anemia due to blood loss, chronic 10/25/2024   Mood changes 04/21/2024   Abnormal uterine bleeding (AUB) 12/30/2022   Urinary tract infection without hematuria 08/18/2022   Iron  deficiency 07/04/2021   Obesity, Class II, BMI 35-39.9 07/04/2021   Cough 07/04/2021   Hypertension 08/05/2020   Pre-diabetes 07/10/2020   Finger pain, left 07/10/2020   Elevated troponin 04/30/2020   Depression 04/30/2020   History of reversal of tubal ligation 09/17/2017   Medications:  Najwa Spillane. Mizrahi had no medications administered during this visit. Current Outpatient Medications  Medication Sig Dispense Refill   amLODipine  (NORVASC ) 5 MG tablet Take 1 tablet (5 mg total) by mouth daily. (Patient not taking: Reported on 10/23/2024) 30 tablet 2   ascorbic acid (VITAMIN C) 500 MG tablet Take 500 mg by mouth daily.      ferrous sulfate  325 (65 FE) MG EC tablet Take 1 tablet (325 mg total) by mouth every other day.     fluconazole  (DIFLUCAN ) 150 MG tablet Take 150 mg by mouth once.     norethindrone  (AYGESTIN ) 5 MG tablet Take 2 tablets (10 mg total) by mouth daily. Can increase to three tablets daily in the event of heavy bleeding or inadequately controlled bleeding. 60 tablet 2   No current facility-administered medications for this visit.    Allergies: has no known allergies.  Physical Exam:  BP (!) 146/93   Pulse 90   Wt 229 lb (103.9 kg)   BMI 39.31 kg/m  Body mass index is 39.31 kg/m. General appearance: Well nourished, well developed female in no acute distress.  Neuro/Psych:  Normal mood and affect.    Labs: 10/2024 neg tsh, quant, swab except for BV. Hgb 8.5  Pap and hpv neg 05/2023  Radiology: Narrative & Impression  CLINICAL DATA:  Abnormal uterine bleeding   EXAM: TRANSABDOMINAL AND TRANSVAGINAL ULTRASOUND OF PELVIS   TECHNIQUE: Both transabdominal and transvaginal ultrasound examinations of the pelvis were performed. Transabdominal technique was performed for global imaging of the pelvis including uterus, ovaries, adnexal regions, and pelvic cul-de-sac. It was necessary to proceed with endovaginal exam following the transabdominal exam to visualize the uterus and adnexae in better detail.   COMPARISON:  11/24/2021   FINDINGS: Uterus   Measurements: 11.0 x 5.76 x 2 cm = volume: 205 mL. Overall normal uterine size. Nonspecific minor heterogeneity of the uterus. Subcentimeter left uterine body small hypoechoic focus  measures only 7 mm, nonspecific but suspect incidental small fibroid. Anteverted position.   Endometrium   Thickness: 11.5.  No focal abnormality visualized.   Right ovary   Measurements: 3.5 x 1.5 x 2.3 cm = volume: 6.0 mL. Normal appearance/no adnexal mass.   Left ovary   Not visualized, obscured by bowel gas   Other findings   No abnormal  free fluid.   IMPRESSION: 1. No acute or significant finding by pelvic ultrasound. 2. Nonvisualized left ovary.     Electronically Signed   By: CHRISTELLA.  Shick M.D.   On: 10/28/2024 20:09   Assessment: patient stable  Plan:  1. History of anemia (Primary) S/p IV iron  late 2025. Re-check today - Anemia Profile B  2. Abnormal uterine bleeding (AUB) Long d/w her re: limited options. I told her that she could try another medical option but I'm not optimistic it would help with options limited to mirena or depo provera . I told her that surgical options include repeat hysteroscopy, d&c and to remove any potential new polyps, endometrial ablation or hysterectomy. I told her I don't recommend an ablation as it's not indicated for intermenstrual bleeding, although it's done off-label for this and she could be done but she just has know it's off label. I also told her she would need contraception afterwards as pregnancy afterwards isn't recommended. She does not want a hyst.   She is interested in repeat hysteroscopy, d&c. I told her I would recommend starting meds immediately after the surgery, as it would work better, and I prefer a Mirena which can be done at the time of hysteroscopy. She states she's used depo in the remote past and would like to do it again.   Message sent to Dr. Herchel re: scheduling this as well as the aygestin  refill.  - Anemia Profile B  3. Primary hypertension  4. Pre-diabetes   No follow-ups on file.  No future appointments.  Bebe Izell Raddle MD Attending Center for [facility])

## 2024-12-29 ENCOUNTER — Ambulatory Visit: Payer: Self-pay | Admitting: Obstetrics and Gynecology

## 2024-12-29 LAB — ANEMIA PROFILE B
Basophils Absolute: 0 x10E3/uL (ref 0.0–0.2)
Basos: 0 %
EOS (ABSOLUTE): 0.1 x10E3/uL (ref 0.0–0.4)
Eos: 2 %
Ferritin: 77 ng/mL (ref 15–150)
Folate: 5.3 ng/mL
Hematocrit: 37.1 % (ref 34.0–46.6)
Hemoglobin: 11.4 g/dL (ref 11.1–15.9)
Immature Grans (Abs): 0 x10E3/uL (ref 0.0–0.1)
Immature Granulocytes: 0 %
Iron Saturation: 17 % (ref 15–55)
Iron: 68 ug/dL (ref 27–159)
Lymphocytes Absolute: 1.8 x10E3/uL (ref 0.7–3.1)
Lymphs: 24 %
MCH: 25.9 pg — ABNORMAL LOW (ref 26.6–33.0)
MCHC: 30.7 g/dL — ABNORMAL LOW (ref 31.5–35.7)
MCV: 84 fL (ref 79–97)
Monocytes Absolute: 0.3 x10E3/uL (ref 0.1–0.9)
Monocytes: 4 %
Neutrophils Absolute: 5.1 x10E3/uL (ref 1.4–7.0)
Neutrophils: 70 %
Platelets: 405 x10E3/uL (ref 150–450)
RBC: 4.41 x10E6/uL (ref 3.77–5.28)
Retic Ct Pct: 0.9 % (ref 0.6–2.6)
Total Iron Binding Capacity: 403 ug/dL (ref 250–450)
UIBC: 335 ug/dL (ref 131–425)
Vitamin B-12: 384 pg/mL (ref 232–1245)
WBC: 7.3 x10E3/uL (ref 3.4–10.8)

## 2025-01-01 ENCOUNTER — Telehealth: Payer: Self-pay

## 2025-01-01 ENCOUNTER — Encounter: Payer: Self-pay | Admitting: Obstetrics and Gynecology

## 2025-01-01 NOTE — Telephone Encounter (Signed)
 I called patient to see if she's available for surgery with Dr. Izell on 01/08/25 at 3:15 pm. I left a voicemail with details and explained that I needed an call back today, in order to save the OR space.

## 2025-01-03 ENCOUNTER — Other Ambulatory Visit: Payer: Self-pay | Admitting: Obstetrics and Gynecology

## 2025-01-03 DIAGNOSIS — Z01818 Encounter for other preprocedural examination: Secondary | ICD-10-CM

## 2025-01-05 ENCOUNTER — Encounter (HOSPITAL_COMMUNITY): Payer: Self-pay | Admitting: Obstetrics and Gynecology

## 2025-01-05 NOTE — Progress Notes (Signed)
 Unable to reach patient via phone.  LMOM for patient to return call at 1532.  Patient returned call at 1600 and requested that I send instructions for DOS via email at  [email or web address]).  Instructions were emailed to patient.     PCP - Lonni Africa, DO Cardiologist - none  Chest x-ray - n/a EKG - DOS Stress Test - n/a ECHO - 05/22/20 Cardiac Cath - n/a  ICD Pacemaker/Loop - n/a  Sleep Study -  n/a  Diabetes - n/a  Aspirin & Blood Thinner Instructions: n/a  ERAS - clear liquids til 12:30 PM DOS.  Anesthesia review: no  STOP now taking any Aspirin (unless otherwise instructed by your surgeon), Aleve, Naproxen, Ibuprofen , Motrin , Advil , Goody's, BC's, all herbal medications, fish oil, and all vitamins.

## 2025-01-05 NOTE — Progress Notes (Signed)
 Surgical Instructions    Your procedure is scheduled on Monday, 01/08/25.SABRA  Report to [facility] Main Entrance A at 11:45 A.M., then check in with the Admitting office.  Call this number if you have problems the morning of surgery:  [phone]   If you have any questions prior to your surgery date call [phone]: Open Monday-Friday 8am-4pm If you experience any cold or flu symptoms such as cough, fever, chills, shortness of breath, etc. between now and your scheduled surgery, please notify us  at the above number     Remember:  Do not eat after midnight the night before your surgery  You may drink clear liquids until 11:15 AM, the day of your surgery.   Clear liquids allowed are: Water, Non-Citrus Juices (without pulp), Carbonated Beverages, Clear Tea, Black Coffee ONLY (NO MILK, CREAM OR POWDERED CREAMER of any kind), and Gatorade    Take these medicines the morning of surgery with A SIP OF WATER:  Aygestin    As of today, STOP taking any Aspirin (unless otherwise instructed by your surgeon) Aleve, Naproxen, Ibuprofen , Motrin , Advil , Goody's, BC's, all herbal medications, fish oil, and all vitamins.           Do not wear jewelry or makeup. Do not wear lotions, powders, perfumes or deodorant. Do not shave 48 hours prior to surgery.   Do not bring valuables to the hospital. Do not wear nail polish, gel polish, artificial nails, or any other type of covering on natural nails (fingers and toes) If you have artificial nails or gel coating that need to be removed by a nail salon, please have this removed prior to surgery. Artificial nails or gel coating may interfere with anesthesia's ability to adequately monitor your vital signs.   is not responsible for any belongings or valuables.    Do NOT Smoke (Tobacco/Vaping)  24 hours prior to your procedure  If you use a CPAP at night, you may bring your mask for your overnight stay.   Contacts, glasses, hearing aids, dentures  or partials may not be worn into surgery, please bring cases for these belongings   For patients admitted to the hospital, discharge time will be determined by your treatment team.   Patients discharged the day of surgery will not be allowed to drive home, and someone needs to stay with them for 24 hours.   SURGICAL WAITING ROOM VISITATION Patients having surgery or a procedure may have no more than 2 support people in the waiting area - these visitors may rotate.   Children under the age of 74 must have an adult with them who is not the patient. If the patient needs to stay at the hospital during part of their recovery, the visitor guidelines for inpatient rooms apply. Pre-op nurse will coordinate an appropriate time for 1 support person to accompany patient in pre-op.  This support person may not rotate.   Please refer to [email or web address] for the visitor guidelines for Inpatients (after your surgery is over and you are in a regular room).    Special instructions:    Oral Hygiene is also important to reduce your risk of infection.  Remember - BRUSH YOUR TEETH THE MORNING OF SURGERY WITH YOUR REGULAR TOOTHPASTE

## 2025-01-08 ENCOUNTER — Ambulatory Visit (HOSPITAL_COMMUNITY): Admitting: Anesthesiology

## 2025-01-08 ENCOUNTER — Encounter (HOSPITAL_COMMUNITY): Admission: RE | Disposition: A | Payer: Self-pay | Source: Home / Self Care | Attending: Obstetrics and Gynecology

## 2025-01-08 ENCOUNTER — Other Ambulatory Visit: Payer: Self-pay

## 2025-01-08 ENCOUNTER — Observation Stay (HOSPITAL_COMMUNITY)
Admission: RE | Admit: 2025-01-08 | Discharge: 2025-01-09 | Disposition: A | Attending: Obstetrics and Gynecology | Admitting: Obstetrics and Gynecology

## 2025-01-08 ENCOUNTER — Encounter (HOSPITAL_COMMUNITY): Payer: Self-pay | Admitting: Obstetrics and Gynecology

## 2025-01-08 DIAGNOSIS — Z9889 Other specified postprocedural states: Principal | ICD-10-CM

## 2025-01-08 DIAGNOSIS — Z87891 Personal history of nicotine dependence: Secondary | ICD-10-CM | POA: Insufficient documentation

## 2025-01-08 DIAGNOSIS — N938 Other specified abnormal uterine and vaginal bleeding: Principal | ICD-10-CM | POA: Diagnosis present

## 2025-01-08 DIAGNOSIS — N9489 Other specified conditions associated with female genital organs and menstrual cycle: Secondary | ICD-10-CM | POA: Insufficient documentation

## 2025-01-08 DIAGNOSIS — Z01818 Encounter for other preprocedural examination: Secondary | ICD-10-CM

## 2025-01-08 DIAGNOSIS — I10 Essential (primary) hypertension: Secondary | ICD-10-CM | POA: Insufficient documentation

## 2025-01-08 DIAGNOSIS — Z79899 Other long term (current) drug therapy: Secondary | ICD-10-CM | POA: Insufficient documentation

## 2025-01-08 DIAGNOSIS — N939 Abnormal uterine and vaginal bleeding, unspecified: Secondary | ICD-10-CM

## 2025-01-08 LAB — CBC
HCT: 34.1 % — ABNORMAL LOW (ref 36.0–46.0)
Hemoglobin: 11 g/dL — ABNORMAL LOW (ref 12.0–15.0)
MCH: 26.6 pg (ref 26.0–34.0)
MCHC: 32.3 g/dL (ref 30.0–36.0)
MCV: 82.4 fL (ref 80.0–100.0)
Platelets: 319 10*3/uL (ref 150–400)
RBC: 4.14 MIL/uL (ref 3.87–5.11)
WBC: 9 10*3/uL (ref 4.0–10.5)
nRBC: 0 % (ref 0.0–0.2)

## 2025-01-08 LAB — BASIC METABOLIC PANEL WITH GFR
Anion gap: 12 (ref 5–15)
BUN: 8 mg/dL (ref 6–20)
CO2: 19 mmol/L — ABNORMAL LOW (ref 22–32)
Calcium: 8.7 mg/dL — ABNORMAL LOW (ref 8.9–10.3)
Chloride: 105 mmol/L (ref 98–111)
Creatinine, Ser: 0.74 mg/dL (ref 0.44–1.00)
GFR, Estimated: >60 mL/min
Glucose, Bld: 93 mg/dL (ref 70–99)
Potassium: 4.1 mmol/L (ref 3.5–5.1)
Sodium: 136 mmol/L (ref 135–145)

## 2025-01-08 LAB — POCT PREGNANCY, URINE: Preg Test, Ur: NEGATIVE

## 2025-01-08 MED ORDER — ONDANSETRON HCL 4 MG/2ML IJ SOLN
INTRAMUSCULAR | Status: AC
  Filled 2025-01-08: qty 2

## 2025-01-08 MED ORDER — ONDANSETRON HCL 4 MG/2ML IJ SOLN: 4.0000 mg | Freq: Four times a day (QID) | INTRAMUSCULAR | Status: DC | PRN

## 2025-01-08 MED ORDER — ACETAMINOPHEN 10 MG/ML IV SOLN
INTRAVENOUS | Status: AC
  Filled 2025-01-08: qty 100

## 2025-01-08 MED ORDER — PROPOFOL 10 MG/ML IV BOLUS
INTRAVENOUS | Status: DC | PRN
  Administered 2025-01-08: 200 ug/kg/min via INTRAVENOUS
  Administered 2025-01-08: 150 mg via INTRAVENOUS

## 2025-01-08 MED ORDER — CHLORHEXIDINE GLUCONATE 0.12 % MT SOLN
15.0000 mL | Freq: Once | OROMUCOSAL | Status: AC
  Administered 2025-01-08: 15 mL via OROMUCOSAL
  Filled 2025-01-08: qty 15

## 2025-01-08 MED ORDER — OXYCODONE HCL 5 MG PO TABS: 5.0000 mg | ORAL_TABLET | Freq: Once | ORAL | Status: DC | PRN

## 2025-01-08 MED ORDER — ACETAMINOPHEN 10 MG/ML IV SOLN: 1000.0000 mg | Freq: Once | INTRAVENOUS | Status: DC | PRN

## 2025-01-08 MED ORDER — DEXAMETHASONE SOD PHOSPHATE PF 10 MG/ML IJ SOLN
INTRAMUSCULAR | Status: AC
  Filled 2025-01-08: qty 1

## 2025-01-08 MED ORDER — ACETAMINOPHEN 325 MG PO TABS
650.0000 mg | ORAL_TABLET | ORAL | Status: DC | PRN
  Administered 2025-01-09: 650 mg via ORAL
  Filled 2025-01-08: qty 2

## 2025-01-08 MED ORDER — LIDOCAINE HCL (PF) 1 % IJ SOLN
INTRAMUSCULAR | Status: AC
  Filled 2025-01-08: qty 30

## 2025-01-08 MED ORDER — ONDANSETRON HCL 4 MG/2ML IJ SOLN
INTRAMUSCULAR | Status: DC | PRN
  Administered 2025-01-08: 4 mg via INTRAVENOUS

## 2025-01-08 MED ORDER — POLYETHYLENE GLYCOL 3350 17 G PO PACK: 17.0000 g | PACK | Freq: Every day | ORAL | Status: DC | PRN

## 2025-01-08 MED ORDER — MIDAZOLAM HCL 2 MG/2ML IJ SOLN
INTRAMUSCULAR | Status: AC
  Filled 2025-01-08: qty 2

## 2025-01-08 MED ORDER — ORAL CARE MOUTH RINSE: 15.0000 mL | Freq: Once | OROMUCOSAL | Status: AC

## 2025-01-08 MED ORDER — IBUPROFEN 600 MG PO TABS
600.0000 mg | ORAL_TABLET | Freq: Four times a day (QID) | ORAL | Status: DC
  Administered 2025-01-08 – 2025-01-09 (×3): 600 mg via ORAL
  Filled 2025-01-08 (×4): qty 1

## 2025-01-08 MED ORDER — KETOROLAC TROMETHAMINE 30 MG/ML IJ SOLN
INTRAMUSCULAR | Status: AC
  Filled 2025-01-08: qty 1

## 2025-01-08 MED ORDER — FENTANYL CITRATE (PF) 250 MCG/5ML IJ SOLN
INTRAMUSCULAR | Status: DC | PRN
  Administered 2025-01-08: 50 ug via INTRAVENOUS

## 2025-01-08 MED ORDER — DEXAMETHASONE SOD PHOSPHATE PF 10 MG/ML IJ SOLN
INTRAMUSCULAR | Status: DC | PRN
  Administered 2025-01-08: 10 mg via INTRAVENOUS

## 2025-01-08 MED ORDER — LACTATED RINGERS IV SOLN: INTRAVENOUS | Status: DC

## 2025-01-08 MED ORDER — FENTANYL CITRATE (PF) 100 MCG/2ML IJ SOLN: 25.0000 ug | INTRAMUSCULAR | Status: DC | PRN

## 2025-01-08 MED ORDER — OXYCODONE HCL 5 MG/5ML PO SOLN: 5.0000 mg | Freq: Once | ORAL | Status: DC | PRN

## 2025-01-08 MED ORDER — PROPOFOL 10 MG/ML IV BOLUS
INTRAVENOUS | Status: AC
  Filled 2025-01-08: qty 20

## 2025-01-08 MED ORDER — DROPERIDOL 2.5 MG/ML IJ SOLN: 0.6250 mg | Freq: Once | INTRAMUSCULAR | Status: DC | PRN

## 2025-01-08 MED ORDER — ONDANSETRON HCL 4 MG PO TABS: 4.0000 mg | ORAL_TABLET | Freq: Four times a day (QID) | ORAL | Status: DC | PRN

## 2025-01-08 MED ORDER — FENTANYL CITRATE (PF) 100 MCG/2ML IJ SOLN
INTRAMUSCULAR | Status: AC
  Filled 2025-01-08: qty 2

## 2025-01-08 MED ORDER — ACETAMINOPHEN 10 MG/ML IV SOLN
INTRAVENOUS | Status: DC | PRN
  Administered 2025-01-08: 1000 mg via INTRAVENOUS

## 2025-01-08 MED ORDER — MIDAZOLAM HCL (PF) 2 MG/2ML IJ SOLN
INTRAMUSCULAR | Status: DC | PRN
  Administered 2025-01-08: 2 mg via INTRAVENOUS

## 2025-01-08 MED ORDER — SIMETHICONE 80 MG PO CHEW: 80.0000 mg | CHEWABLE_TABLET | Freq: Four times a day (QID) | ORAL | Status: DC | PRN

## 2025-01-08 MED ORDER — 0.9 % SODIUM CHLORIDE (POUR BTL) OPTIME
TOPICAL | Status: DC | PRN
  Administered 2025-01-08: 1000 mL

## 2025-01-08 MED ORDER — LIDOCAINE HCL 1 % IJ SOLN
INTRAMUSCULAR | Status: DC | PRN
  Administered 2025-01-08: 10 mL

## 2025-01-08 NOTE — H&P (Signed)
 Obstetrics and Gynecology  Surgical H&P Appointment Date: 01/08/2025  Primary Care Provider: Harrie Pearson  OBGYN Clinic: [facility]  Chief Complaint: scheduled hysteroscopy, d&c for AUB  History of Present Illness:  Alyssa Pearson is a 39 y.o. G14P3 with above chief complaint. History significant for HTN, h/o endometrial polyps, one tube in place after an ectopic pregnancy with a prior h/o tubal reversal, h/o anemia  Patient seen by me on 12/28/24 as an add on visit for AUB. Patient followed by Dr. Herchel and patient states not currently bleeding but she continues to have episodes of spotting and then passing clots. Patient ran out of Aygestin  three days ago but she said she was doing 15mg  qday; she has previously used megace , micronor  and orthocyclen. She had a 2024 hysteroscopy and myosure polypectomy (benign path) and she said that her AUB resolved afterwards.   Patient last seen by Dr. Herchel 10/24/24 and plan at that time was to check labs, ultrasound and follow up after that  I told her I recommended follow up with Dr. Herchel for consideration with another hysteroscopy d&c, which Dr. Herchel felt to be reasonable but she had no OR time so asked if I could do it.   Review of Systems: as noted in the History of Present Illness.  Patient Active Problem List   Diagnosis Date Noted   Anemia due to blood loss, chronic 10/25/2024   Mood changes 04/21/2024   Abnormal uterine bleeding (AUB) 12/30/2022   Urinary tract infection without hematuria 08/18/2022   Iron  deficiency 07/04/2021   Obesity, Class II, BMI 35-39.9 07/04/2021   Cough 07/04/2021   Hypertension 08/05/2020   Pre-diabetes 07/10/2020   Finger pain, left 07/10/2020   Elevated troponin 04/30/2020   Depression 04/30/2020   History of reversal of tubal ligation 09/17/2017   Medications:  Alyssa Pearson had no medications administered during this visit. Current  Facility-Administered Medications  Medication Dose Route Frequency Provider Last Rate Last Admin   chlorhexidine  (PERIDEX ) 0.12 % solution 15 mL  15 mL Mouth/Throat Once Alyssa Fess, MD       Or   Oral care mouth rinse  15 mL Mouth Rinse Once Alyssa Fess, MD       lactated ringers  infusion   Intravenous Continuous Zak, Arthur, MD        Allergies: has no known allergies.  Physical Exam:  BP (!) 148/90   Pulse 93   Temp 98.3 F (36.8 C) (Oral)   Resp 18   Ht 5' 4 (1.626 m)   Wt 103.9 kg   LMP 07/17/2024 (Approximate)   SpO2 97%   BMI 39.32 kg/m  Body mass index is 39.32 kg/m. General appearance: Well nourished, well developed female in no acute distress.  CV: normal s1 and s2, no MRGs Pulm: CTAB Neuro/Psych:  Normal mood and affect.    Labs:    Latest Ref Rng & Units 12/28/2024    9:43 AM 10/24/2024   10:41 AM 11/24/2023   11:10 PM  CBC  WBC 3.4 - 10.8 x10E3/uL 7.3  8.6  11.5   Hemoglobin 11.1 - 15.9 g/dL 88.5  8.5  8.8   Hematocrit 34.0 - 46.6 % 37.1  29.4  28.0   Platelets 150 - 450 x10E3/uL 405  533  467    Pap and hpv neg 05/2023  Radiology: Narrative & Impression  CLINICAL DATA:  Abnormal uterine bleeding   EXAM: TRANSABDOMINAL AND TRANSVAGINAL ULTRASOUND OF PELVIS   TECHNIQUE:  Both transabdominal and transvaginal ultrasound examinations of the pelvis were performed. Transabdominal technique was performed for global imaging of the pelvis including uterus, ovaries, adnexal regions, and pelvic cul-de-sac. It was necessary to proceed with endovaginal exam following the transabdominal exam to visualize the uterus and adnexae in better detail.   COMPARISON:  11/24/2021   FINDINGS: Uterus   Measurements: 11.0 x 5.76 x 2 cm = volume: 205 mL. Overall normal uterine size. Nonspecific minor heterogeneity of the uterus. Subcentimeter left uterine body small hypoechoic focus measures only 7 mm, nonspecific but suspect incidental small fibroid.  Anteverted position.   Endometrium   Thickness: 11.5.  No focal abnormality visualized.   Right ovary   Measurements: 3.5 x 1.5 x 2.3 cm = volume: 6.0 mL. Normal appearance/no adnexal mass.   Left ovary   Not visualized, obscured by bowel gas   Other findings   No abnormal free fluid.   IMPRESSION: 1. No acute or significant finding by pelvic ultrasound. 2. Nonvisualized left ovary.     Electronically Signed   By: CHRISTELLA.  Pearson M.D.   On: 10/28/2024 20:09   Assessment: patient stable  Plan:  1. History of anemia (Primary) S/p IV iron  late 2025.   2. Abnormal uterine bleeding (AUB) Patient amenable to repeat hysteroscopy, d&c, and I told her I would recommend starting meds immediately after the surgery, as it would work better, and I prefer a Mirena which can be done at the time of hysteroscopy. She states she's used depo in the remote past and would like to do it again.   Will order depo provera  from the PACU    Alyssa Izell Raddle MD Attending Center for [facility])

## 2025-01-08 NOTE — Anesthesia Preprocedure Evaluation (Signed)
"                                    Anesthesia Evaluation  Patient identified by MRN, date of birth, ID band Patient awake    Reviewed: Allergy & Precautions, NPO status , Patient's Chart, lab work & pertinent test results  History of Anesthesia Complications Negative for: history of anesthetic complications  Airway Mallampati: I  TM Distance: >3 FB Neck ROM: Full    Dental no notable dental hx. (+) Teeth Intact   Pulmonary neg pulmonary ROS, neg sleep apnea, neg COPD, Patient abstained from smoking.Not current smoker, former smoker   Pulmonary exam normal breath sounds clear to auscultation       Cardiovascular Exercise Tolerance: Good METShypertension, (-) CAD and (-) Past MI (-) dysrhythmias  Rhythm:Regular Rate:Normal - Systolic murmurs    Neuro/Psych  Headaches PSYCHIATRIC DISORDERS Anxiety Depression       GI/Hepatic ,GERD  Controlled,,(+)     (-) substance abuse    Endo/Other  neg diabetes    Renal/GU negative Renal ROS     Musculoskeletal   Abdominal  (+) + obese  Peds  Hematology   Anesthesia Other Findings Past Medical History: No date: Anemia No date: Anxiety     Comment:  h/o  No date: Arthritis     Comment:  bil feet No date: Depression     Comment:  h/o 04/16/2021: Ectopic pregnancy 12/28/2024: Endometrial polyp     Comment:  AUB - polyps No date: GERD (gastroesophageal reflux disease)     Comment:  h/o No date: Headache     Comment:  migraines No date: Hypertension No date: Pre-diabetes No date: Vertigo  Reproductive/Obstetrics                              Anesthesia Physical Anesthesia Plan  ASA: 2  Anesthesia Plan: General   Post-op Pain Management: Ofirmev  IV (intra-op)* and Toradol  IV (intra-op)*   Induction: Intravenous  PONV Risk Score and Plan: 4 or greater and Ondansetron , Dexamethasone  and Midazolam   Airway Management Planned: LMA  Additional Equipment: None  Intra-op Plan:    Post-operative Plan: Extubation in OR  Informed Consent: I have reviewed the patients History and Physical, chart, labs and discussed the procedure including the risks, benefits and alternatives for the proposed anesthesia with the patient or authorized representative who has indicated his/her understanding and acceptance.     Dental advisory given  Plan Discussed with: CRNA and Surgeon  Anesthesia Plan Comments: (Discussed risks of anesthesia with patient, including PONV, sore throat, lip/dental/eye damage. Rare risks discussed as well, such as cardiorespiratory and neurological sequelae, and allergic reactions. Discussed the role of CRNA in patient's perioperative care. Patient understands.)        Anesthesia Quick Evaluation  "

## 2025-01-08 NOTE — Discharge Instructions (Addendum)
   We will discuss your surgery once again in detail at your post-op visit in two to four weeks. If you haven't already done so, please call to make your appointment as soon as possible.  Hysteroscopy, Care After These instructions give you information on caring for yourself after your procedure. Your doctor may also give you more specific instructions. Call your doctor if you have any problems or questions after your procedure. HOME CARE Do not drive for 24 hours. Wait 1 week before doing any activities that wear you out. Do not stand for a long time. Limit stair climbing to once or twice a day. Rest often. Continue with your usual diet. Drink enough fluids to keep your pee (urine) clear or pale yellow. If you have a hard time pooping (constipation), you may: Take a medicine to help you go poop (laxative) as told by your doctor. Eat more fruit and bran. Drink more fluids. Take showers, not baths, for as long as told by your doctor. Do not swim or use a hot tub until your doctor says it is okay. Have someone with you for 1day after the procedure. Do not douche, use tampons, or have sex (intercourse) until seen by your doctor Only take medicines as told by your doctor. Do not take aspirin. It can cause bleeding. Keep all doctor visits. GET HELP IF: You have cramps or pain not helped by medicine. You have new pain in the belly (abdomen). You have a bad smelling fluid coming from your vagina. You have a rash. You have problems with any medicine. GET HELP RIGHT AWAY IF:  You start to bleed more than a regular period. You have a fever. You have chest pain. You have trouble breathing. You feel dizzy or feel like passing out (fainting). You pass out. You have pain in the tops of your shoulders. You have vaginal bleeding with or without clumps of blood (blood clots). MAKE SURE YOU: Understand these instructions. Will watch your condition. Will get help right away if you are not doing  well or get worse. Document Released: 09/01/2008 Document Revised: 11/28/2013 Document Reviewed: 06/22/2013 ExitCare Patient Information 2015 [employer]. This information is not intended to replace advice given to you by your health care provider. Make sure you discuss any questions you have with your health care provider.   

## 2025-01-08 NOTE — Brief Op Note (Signed)
 01/08/2025  2:18 PM  3:57 PM  PATIENT:  Alyssa Pearson  39 y.o. female  PRE-OPERATIVE DIAGNOSIS:  AUB. History of polyps  POST-OPERATIVE DIAGNOSIS:  AUB. Fluffy endometrium  PROCEDURE:  Procedures: DILATATION AND CURETTAGE /HYSTEROSCOPY (N/A)  SURGEON:  Surgeons and Role:    * Izell Harari, MD - Primary   ANESTHESIA:   general and paracervical block   EBL:  <59mL   BLOOD ADMINISTERED:none  DRAINS: I/O cath done per OR  LOCAL MEDICATIONS USED:  LIDOCAINE    SPECIMEN:  endometrial curettings  DISPOSITION OF SPECIMEN:  PATHOLOGY  COUNTS:  YES  TOURNIQUET:  * No tourniquets in log *  DICTATION: .Note written in EPIC  PLAN OF CARE: Discharge to home after PACU  PATIENT DISPOSITION:  PACU - hemodynamically stable.   Delay start of Pharmacological VTE agent (>24hrs) due to surgical blood loss or risk of bleeding: not applicable  Harari Izell Raddle MD Attending Center for [employer] (Faculty [facility]) 01/08/2025 Time: [phone]

## 2025-01-08 NOTE — Transfer of Care (Signed)
 Immediate Anesthesia Transfer of Care Note  Patient: Alyssa Pearson  Procedure(s) Performed: DILATATION AND CURETTAGE /HYSTEROSCOPY (Vagina )  Patient Location: PACU  Anesthesia Type:General  Level of Consciousness: awake, alert , oriented, and patient cooperative  Airway & Oxygen Therapy: Patient Spontanous Breathing and Patient connected to face mask oxygen  Post-op Assessment: Report given to RN and Post -op Vital signs reviewed and stable  Post vital signs: Reviewed and stable  Last Vitals:  Vitals Value Taken Time  BP 147/88 01/08/25 16:09  Temp    Pulse 94 01/08/25 16:10  Resp 30 01/08/25 16:10  SpO2 100 % 01/08/25 16:10  Vitals shown include unfiled device data.  Last Pain:  Vitals:   01/08/25 1228  TempSrc:   PainSc: 0-No pain         Complications: No notable events documented.

## 2025-01-09 ENCOUNTER — Telehealth: Payer: Self-pay | Admitting: Family Medicine

## 2025-01-09 ENCOUNTER — Encounter (HOSPITAL_COMMUNITY): Payer: Self-pay | Admitting: Obstetrics and Gynecology

## 2025-01-09 MED ORDER — NORETHINDRONE ACETATE 5 MG PO TABS: ORAL_TABLET | ORAL | Status: AC

## 2025-01-09 MED ORDER — HEATING PAD PADS: MEDICATED_PAD | Status: AC

## 2025-01-09 MED ORDER — IBUPROFEN 600 MG PO TABS: 600.0000 mg | ORAL_TABLET | Freq: Four times a day (QID) | ORAL | Status: AC | PRN

## 2025-01-09 MED ORDER — ACETAMINOPHEN 325 MG PO TABS: 650.0000 mg | ORAL_TABLET | ORAL | Status: AC | PRN

## 2025-01-09 NOTE — Discharge Summary (Signed)
 Gynecology Discharge Summary Date of Admission: 01/08/2025 Date of Discharge: 01/09/2025  The patient was admitted, as scheduled, and underwent a hysteroscopy, d&c for AUB; please refer to operative note for full details.  She was meeting all post op goals and discharged to home on POD#1; she was kept overnight due to her transportation arrangements changing.  Allergies as of 01/09/2025   No Known Allergies      Medication List     TAKE these medications    acetaminophen  325 MG tablet Commonly known as: TYLENOL  Take 2 tablets (650 mg total) by mouth every 4 (four) hours as needed for mild pain (pain score 1-3) or fever (temperature > 101.5.).   ascorbic acid 500 MG tablet Commonly known as: VITAMIN C Take 500 mg by mouth daily.   ferrous sulfate  325 (65 FE) MG EC tablet Take 1 tablet (325 mg total) by mouth every other day.   Heating Pad Pads Use as needed for discomfort   ibuprofen  600 MG tablet Commonly known as: ADVIL  Take 1 tablet (600 mg total) by mouth every 6 (six) hours as needed for cramping, mild pain (pain score 1-3) or moderate pain (pain score 4-6).   MULTIVITAMIN PO Take 1 tablet by mouth daily.   norethindrone  5 MG tablet Commonly known as: AYGESTIN  Take 1 tablet by mouth every day until you get your depo provera  shot in the office What changed:  how much to take how to take this when to take this additional instructions        No future appointments.  Bebe Izell Overcast MD Attending Center for [facility])

## 2025-01-09 NOTE — Plan of Care (Signed)
 Pt doing well. Pt given D/C instructions with verbal understanding. Pt D/C'd home via wheelchair per MD order. Pt is stable @ D/C and has no other needs at this time. Rosina Rakers, RN

## 2025-01-09 NOTE — Anesthesia Postprocedure Evaluation (Signed)
"   Anesthesia Post Note  Patient: Alyssa Pearson  Procedure(s) Performed: DILATATION AND CURETTAGE /HYSTEROSCOPY (Vagina )     Patient location during evaluation: PACU Anesthesia Type: General Level of consciousness: awake and alert Pain management: pain level controlled Vital Signs Assessment: post-procedure vital signs reviewed and stable Respiratory status: spontaneous breathing, nonlabored ventilation, respiratory function stable and patient connected to nasal cannula oxygen Cardiovascular status: blood pressure returned to baseline and stable Postop Assessment: no apparent nausea or vomiting Anesthetic complications: no   No notable events documented.  Last Vitals:  Vitals:   01/09/25 0045 01/09/25 0544  BP: (!) 158/86 (!) 142/68  Pulse: 80 73  Resp: 18 17  Temp: 36.9 C 37.2 C  SpO2: 99% 98%    Last Pain:  Vitals:   01/09/25 0830  TempSrc:   PainSc: 0-No pain                 Xzaiver Vayda S      "

## 2025-01-10 ENCOUNTER — Ambulatory Visit: Admitting: Certified Nurse Midwife

## 2025-01-10 ENCOUNTER — Telehealth: Payer: Self-pay

## 2025-01-10 LAB — SURGICAL PATHOLOGY

## 2025-01-10 NOTE — Telephone Encounter (Signed)
 Called pt today regarding medication.  Pt report she was able to pick meds from the Pharmacy as prescribed.

## 2025-01-11 ENCOUNTER — Ambulatory Visit

## 2025-01-11 DIAGNOSIS — Z3042 Encounter for surveillance of injectable contraceptive: Secondary | ICD-10-CM | POA: Diagnosis not present

## 2025-01-11 MED ORDER — MEDROXYPROGESTERONE ACETATE 150 MG/ML IM SUSP
150.0000 mg | Freq: Once | INTRAMUSCULAR | Status: AC
  Administered 2025-01-11: 150 mg via INTRAMUSCULAR

## 2025-01-11 NOTE — Progress Notes (Signed)
 Date last pap: 05/19/2023. Last Depo-Provera : New Start. Side Effects if any: none. Serum HCG indicated? NA. Depo-Provera  150 mg IM given by: S.Jerrye, CMA after review with Dr Fredirick.   Office stock Depo given today. Next appointment due 4/23-04/12/25.   Administrations This Visit     medroxyPROGESTERone  (DEPO-PROVERA ) injection 150 mg     Admin Date 01/11/2025 Action Given Dose 150 mg Route Intramuscular Documented By Jerrye Elvie BIRCH, CMA

## 2025-01-29 ENCOUNTER — Ambulatory Visit: Admitting: Obstetrics and Gynecology

## 2025-02-06 ENCOUNTER — Ambulatory Visit: Admitting: Obstetrics & Gynecology
